# Patient Record
Sex: Male | Born: 1955 | Race: White | Hispanic: No | Marital: Married | Smoking: Never smoker
Health system: Southern US, Community
[De-identification: ages and names within clinical notes are randomized; demographics above are authoritative.]

## PROBLEM LIST (undated history)

## (undated) DIAGNOSIS — I739 Peripheral vascular disease, unspecified: Secondary | ICD-10-CM

## (undated) DIAGNOSIS — F0280 Dementia in other diseases classified elsewhere without behavioral disturbance: Secondary | ICD-10-CM

## (undated) DIAGNOSIS — R569 Unspecified convulsions: Secondary | ICD-10-CM

## (undated) DIAGNOSIS — E785 Hyperlipidemia, unspecified: Secondary | ICD-10-CM

## (undated) DIAGNOSIS — R451 Restlessness and agitation: Secondary | ICD-10-CM

## (undated) DIAGNOSIS — G300 Alzheimer's disease with early onset: Secondary | ICD-10-CM

---

## 2009-08-05 ENCOUNTER — Ambulatory Visit: Payer: Self-pay | Admitting: Diagnostic Radiology

## 2009-08-05 ENCOUNTER — Emergency Department (HOSPITAL_BASED_OUTPATIENT_CLINIC_OR_DEPARTMENT_OTHER): Admission: EM | Admit: 2009-08-05 | Discharge: 2009-08-05 | Payer: Self-pay | Admitting: Emergency Medicine

## 2017-03-20 ENCOUNTER — Emergency Department (HOSPITAL_COMMUNITY): Payer: Self-pay

## 2017-03-20 ENCOUNTER — Encounter (HOSPITAL_COMMUNITY): Payer: Self-pay | Admitting: Emergency Medicine

## 2017-03-20 ENCOUNTER — Emergency Department (HOSPITAL_COMMUNITY)
Admission: EM | Admit: 2017-03-20 | Discharge: 2017-03-22 | Disposition: A | Discharge status: Home / Self Care | Payer: Self-pay | Attending: Emergency Medicine | Admitting: Emergency Medicine

## 2017-03-20 DIAGNOSIS — R4689 Other symptoms and signs involving appearance and behavior: Secondary | ICD-10-CM

## 2017-03-20 DIAGNOSIS — Z79899 Other long term (current) drug therapy: Secondary | ICD-10-CM | POA: Insufficient documentation

## 2017-03-20 DIAGNOSIS — F0391 Unspecified dementia with behavioral disturbance: Secondary | ICD-10-CM

## 2017-03-20 DIAGNOSIS — Z008 Encounter for other general examination: Secondary | ICD-10-CM

## 2017-03-20 DIAGNOSIS — F0390 Unspecified dementia without behavioral disturbance: Secondary | ICD-10-CM | POA: Diagnosis present

## 2017-03-20 HISTORY — DX: Hyperlipidemia, unspecified: E78.5

## 2017-03-20 LAB — COMPREHENSIVE METABOLIC PANEL
ALBUMIN: 3.3 g/dL — AB (ref 3.5–5.0)
ALT: 41 U/L (ref 17–63)
AST: 69 U/L — AB (ref 15–41)
Alkaline Phosphatase: 59 U/L (ref 38–126)
Anion gap: 8 (ref 5–15)
BILIRUBIN TOTAL: 0.7 mg/dL (ref 0.3–1.2)
BUN: 14 mg/dL (ref 6–20)
CHLORIDE: 102 mmol/L (ref 101–111)
CO2: 27 mmol/L (ref 22–32)
CREATININE: 0.95 mg/dL (ref 0.61–1.24)
Calcium: 8.3 mg/dL — ABNORMAL LOW (ref 8.9–10.3)
GFR calc Af Amer: >60 mL/min (ref >60)
GLUCOSE: 99 mg/dL (ref 65–99)
Potassium: 3.3 mmol/L — ABNORMAL LOW (ref 3.5–5.1)
Sodium: 137 mmol/L (ref 135–145)
Total Protein: 6.4 g/dL — ABNORMAL LOW (ref 6.5–8.1)

## 2017-03-20 LAB — CBC WITH DIFFERENTIAL/PLATELET
BASOS ABS: 0 10*3/uL (ref 0.0–0.1)
Basophils Relative: 0 %
EOS PCT: 4 %
Eosinophils Absolute: 0.3 10*3/uL (ref 0.0–0.7)
HEMATOCRIT: 37.1 % — AB (ref 39.0–52.0)
Hemoglobin: 13.1 g/dL (ref 13.0–17.0)
LYMPHS PCT: 30 %
Lymphs Abs: 2.2 10*3/uL (ref 0.7–4.0)
MCH: 31.3 pg (ref 26.0–34.0)
MCHC: 35.3 g/dL (ref 30.0–36.0)
MCV: 88.8 fL (ref 78.0–100.0)
MONO ABS: 0.8 10*3/uL (ref 0.1–1.0)
MONOS PCT: 11 %
NEUTROS ABS: 4 10*3/uL (ref 1.7–7.7)
Neutrophils Relative %: 55 %
PLATELETS: 236 10*3/uL (ref 150–400)
RBC: 4.18 MIL/uL — ABNORMAL LOW (ref 4.22–5.81)
RDW: 12.1 % (ref 11.5–15.5)
WBC: 7.2 10*3/uL (ref 4.0–10.5)

## 2017-03-20 LAB — CBG MONITORING, ED: Glucose-Capillary: 85 mg/dL (ref 65–99)

## 2017-03-20 LAB — ETHANOL

## 2017-03-20 MED ORDER — ALPRAZOLAM 0.5 MG PO TABS
0.5000 mg | ORAL_TABLET | Freq: Once | ORAL | Status: AC
  Administered 2017-03-20: 0.5 mg via ORAL
  Filled 2017-03-20: qty 1

## 2017-03-20 MED ORDER — QUETIAPINE FUMARATE 100 MG PO TABS
100.0000 mg | ORAL_TABLET | Freq: Two times a day (BID) | ORAL | Status: DC
  Administered 2017-03-20 – 2017-03-22 (×4): 100 mg via ORAL
  Filled 2017-03-20 (×4): qty 1

## 2017-03-20 MED ORDER — PRAVASTATIN SODIUM 40 MG PO TABS
40.0000 mg | ORAL_TABLET | Freq: Every day | ORAL | Status: DC
  Administered 2017-03-21 – 2017-03-22 (×2): 40 mg via ORAL
  Filled 2017-03-20 (×2): qty 1
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
## 2017-03-20 NOTE — Care Management (Signed)
[facility] to [facility], [facility], [place], [address] [place], [place], [place], [facility] [address] [place], [place], [place], [facility].

## 2017-03-20 NOTE — BHH Counselor (Signed)
Writer received paperwork from ALF. They faxed over General Durable Power of Attorney paperwork and power of attorney is pt's wife Judeth CornfieldStephanie. When Clinical research associatewriter spoke w/ wife, wife thought she was also HPOA for pt but wife wasn't sure. Per RN Deanna, this is only power of attorney paperwork they have for pt.   Evette Cristalaroline Paige Mandy [place], [place] Therapeutic Triage Specialist

## 2017-03-20 NOTE — ED Provider Notes (Signed)
History of dementia, disoriented here No collateral information available -(family unavailable) Here for aggressive behavior New to NH 4 days ago Recent cessation of Xanax by wife (??)  Pending TTS consultation  [facility] RN reports patient is increasingly agitated and restless. Will provide Xanax as he was on this in the past.   TTS performed consultation including collateral information from wife and [facility]. [facility].   Kenneth Mcdaniel, Kenneth Glandon, PA-C 03/20/17 2037    Jacalyn LefevreHaviland, Julie, MD 03/21/17 1255
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
## 2017-03-20 NOTE — ED Notes (Signed)
Bed: WA29 Expected date:  Expected time:  Means of arrival:  Comments: 

## 2017-03-20 NOTE — ED Provider Notes (Signed)
[facility]-EMERGENCY DEPT Provider Note   CSN: [id number] Arrival date & time: 03/20/17  1420     History   Chief Complaint Chief Complaint  Patient presents with  . Psychiatric Evaluation   Level 5 caveat due to dementia HPI Kenneth Mcdaniel is a 61 y.o. male with history of early onset dementia, HLD presents today brought in by nursing home for psychiatric evaluation.  Patient recently [facility] on [facility].  Spoke with Carlye Grippeiesha at the facility who states patient has been aggressive with staff, attempting to strike at them when  they attempt to help him with his daily activities.  She states he has also broken objects in the facility and been generally destructive.  She states his wife dropped him off on Friday and has not been back to see him since then.  She also states that his wife has stopped his Xanax for unknown reasons.  Attempted to call patient's wife, both phone numbers on file are out of service. Patient does not answer questions appropriately and laughs inappropriately; [facility], this is patient's baseline.   The history is provided by the patient.    History reviewed. No pertinent past medical history.  There are no active problems to display for this patient.   The histories are not reviewed yet. Please review them in the "History" navigator section and refresh this SmartLink.     Home Medications    Prior to Admission medications   Medication Sig Start Date End Date Taking? Authorizing Provider  ALPRAZolam Prudy Feeler(XANAX) 1 MG tablet Take 0.5 mg by mouth 3 (three) times daily as needed for anxiety.   Yes [provider]  pravastatin (PRAVACHOL) 40 MG tablet Take 40 mg by mouth daily.   Yes [provider]  QUEtiapine (SEROQUEL) 100 MG tablet Take 100 mg by mouth 2 (two) times daily.   Yes [provider]  rivastigmine (EXELON) 13.3 MG/24HR Place 13.3 mg onto the skin daily.   Yes  [provider]    Family History No family history on file.  Social History Social History   Tobacco Use  . Smoking status: Not on file  Substance Use Topics  . Alcohol use: Not on file  . Drug use: Not on file     Allergies   Patient has no allergy information on record.   Review of Systems Review of Systems  Unable to perform ROS: Dementia     Physical Exam Updated Vital Signs BP 140/86 (BP Location: Right Arm)   Pulse 69   Temp 98.8 F (37.1 C) (Oral)   Resp 16   Physical Exam  Constitutional: He appears well-developed and well-nourished. No distress.  HENT:  Head: Normocephalic and atraumatic.  Eyes: Conjunctivae and EOM are normal. Pupils are equal, round, and reactive to light. Right eye exhibits no discharge. Left eye exhibits no discharge.  Neck: Normal range of motion. Neck supple. No JVD present. No tracheal deviation present.  Cardiovascular: Normal rate, regular rhythm, normal heart sounds and intact distal pulses.  2+ radial and DP/PT pulses bl, no LE edema  Pulmonary/Chest: Effort normal and breath sounds normal. He exhibits no tenderness.  Abdominal: Soft. Bowel sounds are normal. He exhibits distension. There is no tenderness.  Musculoskeletal: Normal range of motion. He exhibits no edema.  No midline spine TTP, no paraspinal muscle tenderness, no deformity, crepitus, or step-off noted. 5/5 strength of BUE and BLE major muscle groups.   Neurological: He  is alert. No cranial nerve deficit or sensory deficit. He exhibits normal muscle tone.  Alert to person only.  No facial droop.  No dysarthria.  Does not answer questions appropriately but generally follows commands appropriately.    Cranial Nerves:  II:  pupils equal, round, reactive to light III,IV, VI: ptosis not present, extra-ocular motions intact bilaterally  V,VII: smile symmetric VIII: hearing grossly normal to voice  X: uvula elevates symmetrically  XI: bilateral shoulder shrug  symmetric and strong XII: midline tongue extension without fassiculations Motor:  Normal tone. 5/5 strength of BUE and BLE major muscle groups including strong and equal grip strength and dorsiflexion/plantar flexion Sensory: light touch normal in all extremities. Cerebellar: normal finger-to-nose with bilateral upper extremities CV: 2+ radial and DP/PT pulses   Skin: Skin is warm and dry. No erythema.  Psychiatric: His affect is labile and inappropriate. Cognition and memory are impaired.  Does not appear to be responding to internal stimuli.  Exhibits word salad when asked questions.  Does not answer questions appropriately. He is inattentive.  Nursing note and vitals reviewed.    ED Treatments / Results  Labs (all labs ordered are listed, but only abnormal results are displayed) Labs Reviewed  COMPREHENSIVE METABOLIC PANEL  ETHANOL  RAPID URINE DRUG SCREEN, HOSP PERFORMED  CBC WITH DIFFERENTIAL/PLATELET  URINALYSIS, COMPLETE (UACMP) WITH MICROSCOPIC  AMMONIA  CBG MONITORING, ED    EKG  EKG Interpretation None       Radiology No results found.  Procedures Procedures (including critical care time)  Medications Ordered in ED Medications - No data to display   Initial Impression / Assessment and Plan / ED Course  I have reviewed the triage vital signs and the nursing notes.  Pertinent labs & imaging results that were available during my care of the patient were reviewed by me and considered in my medical decision making (see chart for details).    Patient presents from nursing home for evaluation of aggressive behavior.  Afebrile, vital signs are stable.  He exhibits labile affect and does not answer questions appropriately.  Per staff at [facility], this is [facility]'s [facility]  He has been with them since Friday (4 days).  Attempted to reach out to patient's wife but phone numbers on file are out of service. Do not have baseline imaging or  notes from PCP. Will obtain CT of head, EKG, UA, and labs for evaluation of altered mental status. Patient seen and evaluated by Dr. Particia NearingHaviland who agrees with assessment and plan at this time.   4:25 PM Signed out to oncoming provider PA Upstill. Awaiting workup and TTS evaluation. If workup is unconcerning for acutely altered mental status, patient medically cleared for TTS evaluation. Otherwise, patient may require admission and further consultation for further evaluation.   [facility] [phone]  Final Clinical Impressions(s) / ED Diagnoses   Final diagnoses:  Aggressive behavior    ED Discharge Orders    None       Bennye AlmFawze, Neveah Bang A, PA-C 03/20/17 1625    Jacalyn LefevreHaviland, Julie, MD 03/21/17 1254
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
## 2017-03-21 ENCOUNTER — Emergency Department (HOSPITAL_COMMUNITY): Payer: Self-pay

## 2017-03-21 ENCOUNTER — Encounter (HOSPITAL_COMMUNITY): Payer: Self-pay | Admitting: Psychiatry

## 2017-03-21 DIAGNOSIS — R413 Other amnesia: Secondary | ICD-10-CM

## 2017-03-21 DIAGNOSIS — R4587 Impulsiveness: Secondary | ICD-10-CM

## 2017-03-21 DIAGNOSIS — F0390 Unspecified dementia without behavioral disturbance: Secondary | ICD-10-CM | POA: Diagnosis present

## 2017-03-21 DIAGNOSIS — F0391 Unspecified dementia with behavioral disturbance: Secondary | ICD-10-CM

## 2017-03-21 DIAGNOSIS — F39 Unspecified mood [affective] disorder: Secondary | ICD-10-CM

## 2017-03-21 LAB — URINALYSIS, COMPLETE (UACMP) WITH MICROSCOPIC
Bacteria, UA: NONE SEEN
Bilirubin Urine: NEGATIVE
Glucose, UA: NEGATIVE mg/dL
Ketones, ur: NEGATIVE mg/dL
Leukocytes, UA: NEGATIVE
Nitrite: NEGATIVE
Protein, ur: NEGATIVE mg/dL
Specific Gravity, Urine: 1.006 (ref 1.005–1.030)
Squamous Epithelial / LPF: NONE SEEN
pH: 6 (ref 5.0–8.0)

## 2017-03-21 LAB — RAPID URINE DRUG SCREEN, HOSP PERFORMED
Amphetamines: NOT DETECTED
Barbiturates: NOT DETECTED
Benzodiazepines: POSITIVE — AB
COCAINE: NOT DETECTED
OPIATES: NOT DETECTED
Tetrahydrocannabinol: NOT DETECTED

## 2017-03-21 MED ORDER — ALPRAZOLAM 0.5 MG PO TABS
0.5000 mg | ORAL_TABLET | Freq: Two times a day (BID) | ORAL | Status: DC | PRN
Start: 2017-03-21 — End: 2017-03-22
  Administered 2017-03-21: 0.5 mg via ORAL
  Filled 2017-03-21: qty 1

## 2017-03-21 NOTE — ED Notes (Signed)
Patient becoming more agitated.  Trying to get out of med. Sitter at bedside.  Will continue to monitor.

## 2017-03-21 NOTE — Consult Note (Addendum)
[place] Psychiatry Consult   Reason for Consult:  Aggressive behavior Referring Physician:  EDP Patient Identification: Kenneth Mcdaniel MRN:  [id number] Principal Diagnosis: Dementia Diagnosis:   Patient Active Problem List   Diagnosis Date Noted  . Dementia [F03.90] 03/21/2017    Total Time spent with patient: 45 minutes  Subjective:   Kenneth Mcdaniel is a 61 y.o. male patient admitted from his ALF with aggressive behavior.  HPI:  Pt was seen and chart reviewed with treatment team and Dr Mariea Clonts. Pt was unable to state the following: his full name, his wife's name, where he is, and what year it is. Pt answered "I don't know" to all questions. Pt would attempt to answer but he stammered and struggled with word finding. Pt was calm and cooperative and able to sit on the side of the bed to eat his breakfast. Pt is able to ambulate and feed himself. Pt seemed extremely happy to know his breakfast was there and started laughing. Pt has severe memory impairment and would benefit from an inpatient gero-psychiatric inpatient admission.    Past Psychiatric History: As above  Risk to Self: Suicidal Ideation: (unable to assess) Suicidal Intent: (unable to assess) Is patient at risk for suicide?: No Suicidal Plan?: (unable to assess) Access to Means: No What has been your use of drugs/alcohol within the last 12 months?: none Other Self Harm Risks: none Triggers for Past Attempts: (n/a) Intentional Self Injurious Behavior: None Risk to Others: Homicidal Ideation: (unable to assess) Thoughts of Harm to Others: (unable to assess) Current Homicidal Intent: (unable to assess) Current Homicidal Plan: (unable to assess) Access to Homicidal Means: No Identified Victim: unable to assess History of harm to others?: No Assessment of Violence: None Noted Violent Behavior Description: (wife reports pt has no hx violence) Does patient have access to weapons?: No Criminal Charges Pending?:  No Does patient have a court date: No Prior Inpatient Therapy: Prior Inpatient Therapy: No Prior Outpatient Therapy: Prior Outpatient Therapy: No Does patient have an ACCT team?: No Does patient have Intensive In-House Services?  : No Does patient have [facility] services? : No Does patient have P4[facility] services?: No  Past Medical History: History reviewed. No pertinent past medical history.  Family History: No family history on file. Family Psychiatric  History: Unknown Social History:  Social History   Substance and Sexual Activity  Alcohol Use Not on file     Social History   Substance and Sexual Activity  Drug Use Not on file    Social History   Socioeconomic History  . Marital status: Married    Spouse name: None  . Number of children: None  . Years of education: None  . Highest education level: None  Social Needs  . Financial resource strain: None  . Food insecurity - worry: None  . Food insecurity - inability: None  . Transportation needs - medical: None  . Transportation needs - non-medical: None  Occupational History  . None  Tobacco Use  . Smoking status: None  Substance and Sexual Activity  . Alcohol use: None  . Drug use: None  . Sexual activity: None  Other Topics Concern  . None  Social History Narrative  . None   Additional Social History: N/A    Allergies:  No Known Allergies  Labs:  Results for orders placed or performed during the hospital encounter of 03/20/17 (from the past 48 hour(s))  Comprehensive metabolic panel     Status: Abnormal  Collection Time: 03/20/17  4:00 PM  Result Value Ref Range   Sodium 137 135 - 145 mmol/L   Potassium 3.3 (L) 3.5 - 5.1 mmol/L   Chloride 102 101 - 111 mmol/L   CO2 27 22 - 32 mmol/L   Glucose, Bld 99 65 - 99 mg/dL   BUN 14 6 - 20 mg/dL   Creatinine, Ser 0.95 0.61 - 1.24 mg/dL   Calcium 8.3 (L) 8.9 - 10.3 mg/dL   Total Protein 6.4 (L) 6.5 - 8.1 g/dL   Albumin 3.3 (L) 3.5 - 5.0 g/dL   AST 69 (H) 15 -  41 U/L   ALT 41 17 - 63 U/L   Alkaline Phosphatase 59 38 - 126 U/L   Total Bilirubin 0.7 0.3 - 1.2 mg/dL   GFR calc non Af Amer >60 >60 mL/min   GFR calc Af Amer >60 >60 mL/min    Comment: (NOTE) The eGFR has been calculated using the CKD EPI equation. This calculation has not been validated in all clinical situations. eGFR's persistently <60 mL/min signify possible Chronic Kidney Disease.    Anion gap 8 5 - 15  Ethanol     Status: None   Collection Time: 03/20/17  4:00 PM  Result Value Ref Range   Alcohol, Ethyl (B) <10 <10 mg/dL    Comment:        LOWEST DETECTABLE LIMIT FOR SERUM ALCOHOL IS 10 mg/dL FOR MEDICAL PURPOSES ONLY   CBC with Diff     Status: Abnormal   Collection Time: 03/20/17  4:00 PM  Result Value Ref Range   WBC 7.2 4.0 - 10.5 K/uL   RBC 4.18 (L) 4.22 - 5.81 MIL/uL   Hemoglobin 13.1 13.0 - 17.0 g/dL   HCT 37.1 (L) 39.0 - 52.0 %   MCV 88.8 78.0 - 100.0 fL   MCH 31.3 26.0 - 34.0 pg   MCHC 35.3 30.0 - 36.0 g/dL   RDW 12.1 11.5 - 15.5 %   Platelets 236 150 - 400 K/uL   Neutrophils Relative % 55 %   Neutro Abs 4.0 1.7 - 7.7 K/uL   Lymphocytes Relative 30 %   Lymphs Abs 2.2 0.7 - 4.0 K/uL   Monocytes Relative 11 %   Monocytes Absolute 0.8 0.1 - 1.0 K/uL   Eosinophils Relative 4 %   Eosinophils Absolute 0.3 0.0 - 0.7 K/uL   Basophils Relative 0 %   Basophils Absolute 0.0 0.0 - 0.1 K/uL  CBG monitoring, ED     Status: None   Collection Time: 03/20/17  4:53 PM  Result Value Ref Range   Glucose-Capillary 85 65 - 99 mg/dL  Urine rapid drug screen (hosp performed)     Status: Abnormal   Collection Time: 03/21/17  5:06 AM  Result Value Ref Range   Opiates NONE DETECTED NONE DETECTED   Cocaine NONE DETECTED NONE DETECTED   Benzodiazepines POSITIVE (A) NONE DETECTED   Amphetamines NONE DETECTED NONE DETECTED   Tetrahydrocannabinol NONE DETECTED NONE DETECTED   Barbiturates NONE DETECTED NONE DETECTED    Comment:        DRUG SCREEN FOR MEDICAL  PURPOSES ONLY.  IF CONFIRMATION IS NEEDED FOR ANY PURPOSE, NOTIFY LAB WITHIN 5 DAYS.        LOWEST DETECTABLE LIMITS FOR URINE DRUG SCREEN Drug Class       Cutoff (ng/mL) Amphetamine      1000 Barbiturate      200 Benzodiazepine   200 Tricyclics  300 Opiates          300 Cocaine          300 THC              50   Urinalysis, Complete w Microscopic     Status: Abnormal   Collection Time: 03/21/17  5:06 AM  Result Value Ref Range   Color, Urine YELLOW YELLOW   APPearance CLEAR CLEAR   Specific Gravity, Urine 1.006 1.005 - 1.030   pH 6.0 5.0 - 8.0   Glucose, UA NEGATIVE NEGATIVE mg/dL   Hgb urine dipstick SMALL (A) NEGATIVE   Bilirubin Urine NEGATIVE NEGATIVE   Ketones, ur NEGATIVE NEGATIVE mg/dL   Protein, ur NEGATIVE NEGATIVE mg/dL   Nitrite NEGATIVE NEGATIVE   Leukocytes, UA NEGATIVE NEGATIVE   RBC / HPF 0-5 0 - 5 RBC/hpf   WBC, UA 0-5 0 - 5 WBC/hpf   Bacteria, UA NONE SEEN NONE SEEN   Squamous Epithelial / LPF NONE SEEN NONE SEEN   Mucus PRESENT     Current Facility-Administered Medications  Medication Dose Route Frequency Provider Last Rate Last Dose  . ALPRAZolam [name]) tablet 0.5 mg  0.5 mg Oral BID PRN Ethelene Hal, NP      . pravastatin (PRAVACHOL) tablet 40 mg  40 mg Oral Daily Upstill, Nehemiah Settle, PA-C   40 mg at 03/21/17 1039  . QUEtiapine (SEROQUEL) tablet 100 mg  100 mg Oral BID Charlann Lange, PA-C   100 mg at 03/21/17 1040   Current Outpatient Medications  Medication Sig Dispense Refill  . pravastatin (PRAVACHOL) 40 MG tablet Take 40 mg by mouth daily.    . QUEtiapine (SEROQUEL) 100 MG tablet Take 100 mg by mouth 2 (two) times daily.      Musculoskeletal: Strength & Muscle Tone: within normal limits Gait & Station: normal Patient leans: N/A  Psychiatric Specialty Exam: Physical Exam  Constitutional: He appears well-developed and well-nourished.  Respiratory: Effort normal.  Musculoskeletal: Normal range of motion.  Neurological: He is  alert.  Psychiatric: He has a normal mood and affect. Thought content normal. His speech is delayed. He expresses impulsivity. He exhibits abnormal recent memory and abnormal remote memory. He is inattentive.    Review of Systems  Psychiatric/Behavioral: Positive for memory loss.  All other systems reviewed and are negative.   Blood pressure 139/81, pulse 74, temperature 97.6 F (36.4 C), temperature source Oral, resp. rate 16, SpO2 100 %.There is no height or weight on file to calculate BMI.  General Appearance: Disheveled  Eye Contact:  Fair  Speech:  Blocked and Dementia  Volume:  Decreased  Mood:  UTA Dementia  Affect:  UTA Dementia  Thought Process:  Disorganized  Orientation:  Other:  self only  Thought Content:  UTA Dementia  Suicidal Thoughts:  UTA Dementia  Homicidal Thoughts:  UTA Dementia  Memory:  Immediate;   Poor Recent;   Poor Remote;   Poor  Judgement:  Other:  UTA Dementia  Insight:  UTA Dementia  Psychomotor Activity:  Restlessness  Concentration:  Concentration: UTA Dementia and Attention Span: UTA Dementia  Recall:  UTA Dementia  Fund of Knowledge:  UTA Dementia  Language:  Fair  Akathisia:  No  Handed:  Right  AIMS (if indicated):   N/A  Assets:  Financial Resources/Insurance  ADL's:  Impaired  Cognition:  Impaired,  Severe  Sleep:   N/A     Treatment Plan Summary: Daily contact with patient to assess and evaluate symptoms  and progress in treatment and Medication management  -Crisis stabilization Continue these medications: -Seroquel 100 mg BID for mood stabilization -Xanax 0.5 mg BID PRN for anxiety. Appears to help with restlessness/agitation.   Disposition: Recommend psychiatric Inpatient admission when medically cleared. TTS to seek Gero-Psych bed  Ethelene Hal, NP 03/21/2017 11:32 AM   Patient seen face-to-face for psychiatric evaluation, chart reviewed and case discussed with the physician extender and developed treatment plan.  Reviewed the information documented and agree with the treatment plan.  Buford Dresser, DO

## 2017-03-21 NOTE — ED Notes (Signed)
Patient ate about 50% of lunch with assistance.

## 2017-03-21 NOTE — ED Notes (Signed)
Patient is calm and cooperative generally.  He did get up from hospital bed once and had been incontinent.  Nurse and techs cleaned him up and now he is sleeping peacefully.

## 2017-03-21 NOTE — BH Assessment (Addendum)
[facility]  Per Juanetta BeetsJacqueline Norman, DO, this pt requires psychiatric hospitalization at this time.  Pt presents under voluntary status, but his wife, Delmar LandauStephanie Parker [place], is his health care power of attorney, and documents are on pt's chart.  The following facilities have been contacted to seek placement for this pt, with results as noted:  Beds available, information sent, decision pending:  [facility]   [facility]  [facility]   [place], [place] Triage Specialist [phone]   Addendum:  At 14:11 this writer called Claudette LawsStephanie Agnes and informed her of pt's current disposition.  I will continue to seek placement.  Doylene Canninghomas Jabir Dahlem, [place] Behavioral Health Coordinator [phone]

## 2017-03-22 MED ORDER — ALPRAZOLAM 0.5 MG PO TABS: 0.5000 mg | ORAL_TABLET | Freq: Two times a day (BID) | ORAL | 0 refills | Status: DC | PRN

## 2017-03-22 NOTE — Progress Notes (Signed)
Patient ID: Kenneth SellMichael R Mcdaniel, male   DOB: 04/04/1956, 61 y.o.   MRN: [id number]   Pt was seen by treatment team this AM. Pt has advanced dementia and lives in a memory care ALF. Pt is st his baseline and will return to his memory care facility. Pt is psychiatrically clear for discharge.   Laveda AbbeLaurie Britton Parks 03/22/2017    1030

## 2017-03-22 NOTE — BH Assessment (Signed)
[facility]  Per Juanetta BeetsJacqueline Norman, DO, this pt does not require psychiatric hospitalization at this time.  [facility]  No discharge instructions are required.  Pt's nurse, Aram BeechamCynthia, has been notified.  Doylene Canninghomas Muaaz Brau, MA Triage Specialist [phone]

## 2017-03-22 NOTE — BHH Suicide Risk Assessment (Signed)
Suicide Risk Assessment  Discharge Assessment   [name]   Principal Problem: Dementia Discharge Diagnoses:  Patient Active Problem List   Diagnosis Date Noted  . Dementia [F03.90] 03/21/2017   Pt was seen by treatment team. Pt has advanced dementia and resides in  A memory care facility. Pt's family wishes to take him back to the facility and has declined the search for an inpatient geriatric  psychiatric admission. Pt is psychiatrically clear for discharge.   Total Time spent with patient: 30 minutes  Musculoskeletal: Strength & Muscle Tone: within normal limits Gait & Station: normal Patient leans: N/A  Psychiatric Specialty Exam:   Blood pressure 136/73, pulse 68, temperature 97.6 F (36.4 C), temperature source Oral, resp. rate 16, SpO2 99 %.There is no height or weight on file to calculate BMI.  General Appearance: Casual  Eye Contact::  Fair  Speech:  [phone]  Volume:  Decreased  Mood:  UTA, advanced dementia  Affect:  UTA, advanced dementia  Thought Process:  Disorganized  Orientation:  Other:  self  Thought Content:  UTA, advanced dementia  Suicidal Thoughts:  UTA, advanced dementia  Homicidal Thoughts:  UTA, advanced dementia  Memory:  Immediate;   Poor Recent;   Poor Remote;   Poor  Judgement:  Other:  UTA, advanced dementia  Insight:  UTA, advanced dementia  Psychomotor Activity:  Normal  Concentration:  UTA, advanced dementia  Recall:  UTA, advanced dementia, poor  Fund of Knowledge:Poor  Language: Fair  Akathisia:  No  Handed:  Right  AIMS (if indicated):     Assets:  Financial Resources/Insurance Housing Social Support  Sleep:     Cognition: Impaired,  Severe  ADL's:  Impaired   Mental Status Per Nursing Assessment::   On Admission:   advanced dementia  Demographic Factors:  Male and Caucasian  Loss Factors: Decline in physical health  Historical Factors: UTA, advanced dementia  Risk Reduction Factors:    Living with another person, especially a relative and Positive social support  Continued Clinical Symptoms:  Depression:   Impulsivity  Cognitive Features That Contribute To Risk:  Loss of executive function    Suicide Risk:  Minimal: No identifiable suicidal ideation.  Patients presenting with no risk factors but with morbid ruminations; may be classified as minimal risk based on the severity of the depressive symptoms    Plan Of Care/Follow-up recommendations:  Activity:  as tolerated Diet:  Heart Healthy  Kenneth AbbeLaurie Britton Chanelle Hodsdon, NP 03/22/2017, 11:38 AM

## 2017-03-22 NOTE — BHH Counselor (Addendum)
Reassessment- Pt did not appear to be oriented. Pt was laughing on and off while the writer was present. The Pt stated yes repeatedly. The Pt could not answer the questions of the reassessment.  Pt's family has decided for the Pt to return back to his ALF.  Kenneth PhoenixBrandi Jayana Mcdaniel, [facility] Triage Specialist

## 2017-03-22 NOTE — ED Provider Notes (Signed)
Discharge per psychiatric team.     Azalia Bilisampos, [place] Lingenfelter, MD 03/22/17 1154

## 2017-03-22 NOTE — Progress Notes (Signed)
CSW completed FL2 [facility]   Stacy GardnerErin Avari Nevares, [facility] Emergency Room Clinical Social Worker [phone]

## 2017-03-22 NOTE — NC FL2 (Signed)
  [place] MEDICAID FL2 LEVEL OF CARE SCREENING TOOL     IDENTIFICATION  Patient Name: Kenneth Mcdaniel Birthdate: 12-28-1955 Sex: male Admission Date (Current Location): 03/20/2017  [facility] [place]   Facility and Address:  [facility],  [address], [place] [id number]      Provider Number: [id number]  Attending Physician Name and Address:  Default, Provider, MD  Relative Name and Phone Number:       Current Level of Care: Hospital Recommended Level of Care: Assisted Living Facility, Memory Care Prior Approval Number:    Date Approved/Denied:   PASRR Number:    Discharge Plan: Other (Comment)(assited living facility )    Current Diagnoses: Patient Active Problem List   Diagnosis Date Noted  . Dementia 03/21/2017    Orientation RESPIRATION BLADDER Height & Weight        Normal Continent Weight:   Height:     BEHAVIORAL SYMPTOMS/MOOD NEUROLOGICAL BOWEL NUTRITION STATUS  Investment banker, corporateWanderer   Continent Diet(regular )  AMBULATORY STATUS COMMUNICATION OF NEEDS Skin   Independent Verbally Normal                       Personal Care Assistance Level of Assistance  Bathing, Feeding, Dressing Bathing Assistance: Limited assistance Feeding assistance: Limited assistance Dressing Assistance: Limited assistance     Functional Limitations Info             SPECIAL CARE FACTORS FREQUENCY                       Contractures      Additional Factors Info  Code Status, Allergies Code Status Info: Full code  Allergies Info: NKA            Current Medications (03/22/2017):  This is the current hospital active medication list Current Facility-Administered Medications  Medication Dose Route Frequency Provider Last Rate Last Dose  . ALPRAZolam Prudy Feeler(XANAX) tablet 0.5 mg  0.5 mg Oral BID PRN Laveda AbbeParks, Laurie Britton, NP   0.5 mg at 03/21/17 1312  . pravastatin (PRAVACHOL) tablet 40 mg  40 mg Oral Daily Upstill, Melvenia BeamShari, PA-C   40 mg at  03/22/17 1103  . QUEtiapine (SEROQUEL) tablet 100 mg  100 mg Oral BID Elpidio AnisUpstill, Shari, PA-C   100 mg at 03/22/17 1103   Current Outpatient Medications  Medication Sig Dispense Refill  . pravastatin (PRAVACHOL) 40 MG tablet Take 40 mg by mouth daily.    . QUEtiapine (SEROQUEL) 100 MG tablet Take 100 mg by mouth 2 (two) times daily.       Discharge Medications: Please see discharge summary for a list of discharge medications.  Relevant Imaging Results:  Relevant Lab Results:   Additional Information SS#: [id number]  Donnie CoffinErin M Keeva Reisen, LCSW

## 2017-04-26 ENCOUNTER — Other Ambulatory Visit: Payer: Self-pay

## 2017-04-26 ENCOUNTER — Encounter (HOSPITAL_COMMUNITY): Payer: Self-pay

## 2017-04-26 ENCOUNTER — Emergency Department (HOSPITAL_COMMUNITY)
Admission: EM | Admit: 2017-04-26 | Discharge: 2017-04-26 | Disposition: A | Discharge status: Home / Self Care | Payer: BLUE CROSS/BLUE SHIELD | Attending: Emergency Medicine | Admitting: Emergency Medicine

## 2017-04-26 DIAGNOSIS — F0280 Dementia in other diseases classified elsewhere without behavioral disturbance: Secondary | ICD-10-CM | POA: Diagnosis not present

## 2017-04-26 DIAGNOSIS — Y92129 Unspecified place in nursing home as the place of occurrence of the external cause: Secondary | ICD-10-CM | POA: Diagnosis not present

## 2017-04-26 DIAGNOSIS — G300 Alzheimer's disease with early onset: Secondary | ICD-10-CM | POA: Diagnosis not present

## 2017-04-26 DIAGNOSIS — S0502XA Injury of conjunctiva and corneal abrasion without foreign body, left eye, initial encounter: Secondary | ICD-10-CM

## 2017-04-26 DIAGNOSIS — Y999 Unspecified external cause status: Secondary | ICD-10-CM | POA: Insufficient documentation

## 2017-04-26 DIAGNOSIS — S0592XA Unspecified injury of left eye and orbit, initial encounter: Secondary | ICD-10-CM | POA: Diagnosis present

## 2017-04-26 DIAGNOSIS — W504XXA Accidental scratch by another person, initial encounter: Secondary | ICD-10-CM | POA: Diagnosis not present

## 2017-04-26 DIAGNOSIS — Z23 Encounter for immunization: Secondary | ICD-10-CM | POA: Diagnosis not present

## 2017-04-26 DIAGNOSIS — Y939 Activity, unspecified: Secondary | ICD-10-CM | POA: Diagnosis not present

## 2017-04-26 HISTORY — DX: Dementia in other diseases classified elsewhere, unspecified severity, without behavioral disturbance, psychotic disturbance, mood disturbance, and anxiety: F02.80

## 2017-04-26 HISTORY — DX: Dementia in other diseases classified elsewhere without behavioral disturbance: G30.0

## 2017-04-26 MED ORDER — FLUORESCEIN SODIUM 1 MG OP STRP
1.0000 | ORAL_STRIP | Freq: Once | OPHTHALMIC | Status: AC
  Administered 2017-04-26: 1 via OPHTHALMIC
  Filled 2017-04-26: qty 1

## 2017-04-26 MED ORDER — KETOROLAC TROMETHAMINE 0.5 % OP SOLN: 1.0000 [drp] | Freq: Four times a day (QID) | OPHTHALMIC | 0 refills | Status: DC

## 2017-04-26 MED ORDER — TETANUS-DIPHTH-ACELL PERTUSSIS 5-2.5-18.5 LF-MCG/0.5 IM SUSP
0.5000 mL | Freq: Once | INTRAMUSCULAR | Status: AC
  Administered 2017-04-26: 0.5 mL via INTRAMUSCULAR
  Filled 2017-04-26: qty 0.5

## 2017-04-26 MED ORDER — POLYMYXIN B-TRIMETHOPRIM 10000-0.1 UNIT/ML-% OP SOLN: 1.0000 [drp] | Freq: Four times a day (QID) | OPHTHALMIC | 0 refills | Status: AC

## 2017-04-26 MED ORDER — TETRACAINE HCL 0.5 % OP SOLN
1.0000 [drp] | Freq: Once | OPHTHALMIC | Status: AC
  Administered 2017-04-26: 1 [drp] via OPHTHALMIC
  Filled 2017-04-26: qty 4
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
## 2017-04-26 NOTE — ED Notes (Signed)
Visual acuity screen attempted. Patient demented and unable to complete screening.

## 2017-04-26 NOTE — Discharge Instructions (Signed)
Apply 1 drop of Polytrim to the left eye every 6 hours for the next 5 days.  Most corneal abrasions heal and 24-72 hours.  Please do not a apply a patch over the left eye.  650 mg of Tylenol can be given once every 6 hours as needed for pain control.  Alternatively, one drop of ketorolac can be placed to the eye every 6 hours as needed for pain control.  If the patient develops redness or swelling around the eye, fever, changes to the cornea or pupil, or complaints of changes to his vision, please return to the emergency department for re-evaluation.

## 2018-03-10 ENCOUNTER — Other Ambulatory Visit: Payer: Self-pay

## 2018-03-10 ENCOUNTER — Emergency Department (HOSPITAL_COMMUNITY): Payer: BLUE CROSS/BLUE SHIELD

## 2018-03-10 ENCOUNTER — Emergency Department (HOSPITAL_COMMUNITY)
Admission: EM | Admit: 2018-03-10 | Discharge: 2018-03-10 | Disposition: A | Discharge status: Home / Self Care | Payer: BLUE CROSS/BLUE SHIELD | Attending: Emergency Medicine | Admitting: Emergency Medicine

## 2018-03-10 ENCOUNTER — Encounter (HOSPITAL_COMMUNITY): Payer: Self-pay | Admitting: Emergency Medicine

## 2018-03-10 DIAGNOSIS — W010XXA Fall on same level from slipping, tripping and stumbling without subsequent striking against object, initial encounter: Secondary | ICD-10-CM | POA: Insufficient documentation

## 2018-03-10 DIAGNOSIS — F0390 Unspecified dementia without behavioral disturbance: Secondary | ICD-10-CM | POA: Diagnosis not present

## 2018-03-10 DIAGNOSIS — Y92128 Other place in nursing home as the place of occurrence of the external cause: Secondary | ICD-10-CM | POA: Insufficient documentation

## 2018-03-10 DIAGNOSIS — Y999 Unspecified external cause status: Secondary | ICD-10-CM | POA: Insufficient documentation

## 2018-03-10 DIAGNOSIS — S0083XA Contusion of other part of head, initial encounter: Secondary | ICD-10-CM | POA: Insufficient documentation

## 2018-03-10 DIAGNOSIS — Z79899 Other long term (current) drug therapy: Secondary | ICD-10-CM | POA: Insufficient documentation

## 2018-03-10 DIAGNOSIS — Y9301 Activity, walking, marching and hiking: Secondary | ICD-10-CM | POA: Insufficient documentation

## 2018-03-10 DIAGNOSIS — S0993XA Unspecified injury of face, initial encounter: Secondary | ICD-10-CM | POA: Diagnosis present

## 2018-03-10 LAB — BASIC METABOLIC PANEL
ANION GAP: 6 (ref 5–15)
BUN: 16 mg/dL (ref 8–23)
CO2: 25 mmol/L (ref 22–32)
Calcium: 9 mg/dL (ref 8.9–10.3)
Chloride: 104 mmol/L (ref 98–111)
Creatinine, Ser: 1.06 mg/dL (ref 0.61–1.24)
GFR calc Af Amer: >60 mL/min (ref >60)
GLUCOSE: 99 mg/dL (ref 70–99)
POTASSIUM: 4 mmol/L (ref 3.5–5.1)
Sodium: 135 mmol/L (ref 135–145)

## 2018-03-10 LAB — CBC
HCT: 39.2 % (ref 39.0–52.0)
Hemoglobin: 13.1 g/dL (ref 13.0–17.0)
MCH: 31.6 pg (ref 26.0–34.0)
MCHC: 33.4 g/dL (ref 30.0–36.0)
MCV: 94.5 fL (ref 80.0–100.0)
NRBC: 0 % (ref 0.0–0.2)
PLATELETS: 158 10*3/uL (ref 150–400)
RBC: 4.15 MIL/uL — ABNORMAL LOW (ref 4.22–5.81)
RDW: 12.1 % (ref 11.5–15.5)
WBC: 6.5 10*3/uL (ref 4.0–10.5)

## 2018-03-10 NOTE — ED Notes (Signed)
[facility] to [facility]

## 2018-03-10 NOTE — ED Provider Notes (Signed)
[facility] EMERGENCY DEPARTMENT Provider Note   CSN: [id number] Arrival date & time: 03/10/18  1926     History   Chief Complaint Chief Complaint  Patient presents with  . Fall    HPI Kenneth Mcdaniel is a 62 y.o. male.  HPI Patient presents to the ED for evaluation after a fall.  Patient was at [facility] assisted living.  He does have a history of dementia.  Patient was observed walking quickly when he stumbled and fell forward landing on his face.  No loss of consciousness.  Patient is not on blood thinners.  They did notice bruising and swelling to the face so they sent him to the emergency room for evaluation.  Patient is only aware of his name but is not aware of his situation.  He is unable to tell me what happened.  He denies any complaints of pain Past Medical History:  Diagnosis Date  . Early onset Alzheimer's dementia (HCC)    from [facility]  . Hyperlipidemia     Patient Active Problem List   Diagnosis Date Noted  . Dementia (HCC) 03/21/2017    History reviewed. No pertinent surgical history.      Home Medications    Prior to Admission medications   Medication Sig Start Date End Date Taking? Authorizing Provider  ALPRAZolam Prudy Feeler) 0.5 MG tablet Take 1 tablet (0.5 mg total) by mouth 2 (two) times daily as needed for anxiety. 03/22/17   Laveda Abbe, NP  ketorolac (ACULAR) 0.5 % ophthalmic solution Place 1 drop into the left eye every 6 (six) hours. 04/26/17   McDonald, Mia A, PA-C  pravastatin (PRAVACHOL) 40 MG tablet Take 40 mg by mouth daily.    [provider]  QUEtiapine (SEROQUEL) 100 MG tablet Take 100 mg by mouth 2 (two) times daily.    [provider]    Family History No family history on file.  Social History Social History   Tobacco Use  . Smoking status: Not on file  Substance Use Topics  . Alcohol use: Not on file  . Drug use: No     Allergies   Patient has no known  allergies.   Review of Systems Review of Systems  All other systems reviewed and are negative.    Physical Exam Updated Vital Signs BP 125/75   Pulse 68   Temp 98.3 F (36.8 C) (Oral)   Resp 18   SpO2 99%   Physical Exam  Constitutional: He appears well-developed and well-nourished. No distress.  HENT:  Head: Normocephalic.  Right Ear: External ear normal.  Left Ear: External ear normal.  Bruising contusion to the right side of the face  Eyes: Conjunctivae are normal. Right eye exhibits no discharge. Left eye exhibits no discharge. No scleral icterus.  Neck: Neck supple. No tracheal deviation present.  Cardiovascular: Normal rate, regular rhythm and intact distal pulses.  Pulmonary/Chest: Effort normal and breath sounds normal. No stridor. No respiratory distress. He has no wheezes. He has no rales.  Abdominal: Soft. Bowel sounds are normal. He exhibits no distension. There is no tenderness. There is no rebound and no guarding.  Musculoskeletal: He exhibits no edema or tenderness.       Right shoulder: He exhibits no tenderness, no bony tenderness and no swelling.       Left shoulder: He exhibits no tenderness, no bony tenderness and no swelling.       Right wrist: He exhibits no tenderness, no bony  tenderness and no swelling.       Left wrist: He exhibits no tenderness, no bony tenderness and no swelling.       Right hip: He exhibits normal range of motion, no tenderness, no bony tenderness and no swelling.       Left hip: He exhibits normal range of motion, no tenderness and no bony tenderness.       Right ankle: He exhibits no swelling. No tenderness.       Left ankle: He exhibits no swelling. No tenderness.       Cervical back: He exhibits no tenderness, no bony tenderness and no swelling.       Thoracic back: He exhibits no tenderness, no bony tenderness and no swelling.       Lumbar back: He exhibits no tenderness, no bony tenderness and no swelling.  Neurological: He  is alert. He has normal strength. No cranial nerve deficit (no facial droop, extraocular movements intact, no slurred speech) or sensory deficit. He exhibits normal muscle tone. He displays no seizure activity. Coordination normal.  Skin: Skin is warm and dry. No rash noted.  Psychiatric: He has a normal mood and affect.  Nursing note and vitals reviewed.    ED Treatments / Results  Labs (all labs ordered are listed, but only abnormal results are displayed) Labs Reviewed  CBC - Abnormal; Notable for the following components:      Result Value   RBC 4.15 (*)    All other components within normal limits  BASIC METABOLIC PANEL    EKG EKG Interpretation  Date/Time:  Sunday March 10 2018 20:30:12 EST Ventricular Rate:  62 PR Interval:    QRS Duration: 102 QT Interval:  390 QTC Calculation: 396 R Axis:   70 Text Interpretation:  Sinus rhythm diffuse st elevation Early repolarization (normal variant) Confirmed by Linwood DibblesKnapp, Aliena Ghrist [phone] on 03/10/2018 8:39:43 PM   Radiology Ct Head Wo Contrast  Result Date: 03/10/2018 CLINICAL DATA:  62 year old male with history of trauma from a fall. Abrasion to the right side of the face. EXAM: CT HEAD WITHOUT CONTRAST CT CERVICAL SPINE WITHOUT CONTRAST TECHNIQUE: Multidetector CT imaging of the head and cervical spine was performed following the standard protocol without intravenous contrast. Multiplanar CT image reconstructions of the cervical spine were also generated. COMPARISON:  Head CT 03/20/2017. FINDINGS: CT HEAD FINDINGS Brain: Moderate cerebral atrophy with ex vacuo dilatation of the ventricular system. Patchy and confluent areas of decreased attenuation are noted throughout the deep and periventricular white matter of the cerebral hemispheres bilaterally, compatible with chronic microvascular ischemic disease. No evidence of acute infarction, hemorrhage, hydrocephalus, extra-axial collection or mass lesion/mass effect. Vascular: No hyperdense  vessel or unexpected calcification. Skull: Normal. Negative for fracture or focal lesion. Surgical changes in the inferior wall of the right orbit anteriorly. Sinuses/Orbits: Multifocal mucosal thickening throughout the paranasal sinuses, most severe in the left maxillary sinus. No acute finding. Other: Small amount of right periorbital soft tissue swelling. CT CERVICAL SPINE FINDINGS Alignment: Normal. Skull base and vertebrae: No acute fracture. No primary bone lesion or focal pathologic process. Soft tissues and spinal canal: No prevertebral fluid or swelling. No visible canal hematoma. Disc levels: Mild multilevel degenerative disc disease and facet arthropathy. Upper chest: Negative. Other: None. IMPRESSION: 1. Small amount of right periorbital soft tissue swelling. No other evidence of significant acute traumatic injury to the skull, brain or cervical spine. 2. Moderate cerebral atrophy with ex vacuo dilatation of the ventricular system and chronic  microvascular ischemic changes in the cerebral white matter, similar to the prior study. 3. Mild multilevel degenerative disc disease and cervical spondylosis. Electronically Signed   By: Trudie Reed M.D.   On: 03/10/2018 20:48   Ct Cervical Spine Wo Contrast  Result Date: 03/10/2018 CLINICAL DATA:  62 year old male with history of trauma from a fall. Abrasion to the right side of the face. EXAM: CT HEAD WITHOUT CONTRAST CT CERVICAL SPINE WITHOUT CONTRAST TECHNIQUE: Multidetector CT imaging of the head and cervical spine was performed following the standard protocol without intravenous contrast. Multiplanar CT image reconstructions of the cervical spine were also generated. COMPARISON:  Head CT 03/20/2017. FINDINGS: CT HEAD FINDINGS Brain: Moderate cerebral atrophy with ex vacuo dilatation of the ventricular system. Patchy and confluent areas of decreased attenuation are noted throughout the deep and periventricular white matter of the cerebral hemispheres  bilaterally, compatible with chronic microvascular ischemic disease. No evidence of acute infarction, hemorrhage, hydrocephalus, extra-axial collection or mass lesion/mass effect. Vascular: No hyperdense vessel or unexpected calcification. Skull: Normal. Negative for fracture or focal lesion. Surgical changes in the inferior wall of the right orbit anteriorly. Sinuses/Orbits: Multifocal mucosal thickening throughout the paranasal sinuses, most severe in the left maxillary sinus. No acute finding. Other: Small amount of right periorbital soft tissue swelling. CT CERVICAL SPINE FINDINGS Alignment: Normal. Skull base and vertebrae: No acute fracture. No primary bone lesion or focal pathologic process. Soft tissues and spinal canal: No prevertebral fluid or swelling. No visible canal hematoma. Disc levels: Mild multilevel degenerative disc disease and facet arthropathy. Upper chest: Negative. Other: None. IMPRESSION: 1. Small amount of right periorbital soft tissue swelling. No other evidence of significant acute traumatic injury to the skull, brain or cervical spine. 2. Moderate cerebral atrophy with ex vacuo dilatation of the ventricular system and chronic microvascular ischemic changes in the cerebral white matter, similar to the prior study. 3. Mild multilevel degenerative disc disease and cervical spondylosis. Electronically Signed   By: Trudie Reed M.D.   On: 03/10/2018 20:48    Procedures Procedures (including critical care time)  Medications Ordered in ED Medications - No data to display   Initial Impression / Assessment and Plan / ED Course  I have reviewed the triage vital signs and the nursing notes.  Pertinent labs & imaging results that were available during my care of the patient were reviewed by me and considered in my medical decision making (see chart for details).   Patient presents to the ED after a fall.  No signs of any serious injury on CT scans.  Patient appears stable for  discharge back to the nursing facility.  Final Clinical Impressions(s) / ED Diagnoses   Final diagnoses:  Facial contusion, initial encounter    ED Discharge Orders    None       Linwood Dibbles, MD 03/10/18 2135

## 2018-03-10 NOTE — ED Notes (Signed)
Called ptar 

## 2018-03-10 NOTE — ED Notes (Signed)
I called pt wife, POA, Judeth CornfieldStephanie, and informed her of pt returning to facility.

## 2018-03-10 NOTE — Discharge Instructions (Addendum)
Take over-the-counter medications as needed for pain, ice to help with swelling

## 2018-03-10 NOTE — ED Triage Notes (Addendum)
Pt arrived EMS from [facility]. Pt was pacing in lobby of facility, started walking faster and tripped over his feet. Witnessed fall, pt fell directly on his face. No LOC, no blood thinners. Pt has alzheimer disease. Abrasion to right side of face noted. Pt Alert to self only (this is baseline)

## 2018-06-01 ENCOUNTER — Other Ambulatory Visit: Payer: Self-pay

## 2018-06-01 ENCOUNTER — Encounter (HOSPITAL_COMMUNITY): Payer: Self-pay

## 2018-06-01 ENCOUNTER — Emergency Department (HOSPITAL_COMMUNITY)
Admission: EM | Admit: 2018-06-01 | Discharge: 2018-06-02 | Disposition: A | Discharge status: Home / Self Care | Payer: BLUE CROSS/BLUE SHIELD | Attending: Emergency Medicine | Admitting: Emergency Medicine

## 2018-06-01 ENCOUNTER — Emergency Department (HOSPITAL_COMMUNITY): Payer: BLUE CROSS/BLUE SHIELD

## 2018-06-01 DIAGNOSIS — Y9302 Activity, running: Secondary | ICD-10-CM | POA: Insufficient documentation

## 2018-06-01 DIAGNOSIS — Y92128 Other place in nursing home as the place of occurrence of the external cause: Secondary | ICD-10-CM | POA: Insufficient documentation

## 2018-06-01 DIAGNOSIS — Z79899 Other long term (current) drug therapy: Secondary | ICD-10-CM | POA: Insufficient documentation

## 2018-06-01 DIAGNOSIS — S0990XA Unspecified injury of head, initial encounter: Secondary | ICD-10-CM | POA: Insufficient documentation

## 2018-06-01 DIAGNOSIS — Y999 Unspecified external cause status: Secondary | ICD-10-CM | POA: Diagnosis not present

## 2018-06-01 DIAGNOSIS — W19XXXA Unspecified fall, initial encounter: Secondary | ICD-10-CM

## 2018-06-01 DIAGNOSIS — G300 Alzheimer's disease with early onset: Secondary | ICD-10-CM | POA: Insufficient documentation

## 2018-06-01 DIAGNOSIS — W01198A Fall on same level from slipping, tripping and stumbling with subsequent striking against other object, initial encounter: Secondary | ICD-10-CM | POA: Diagnosis not present

## 2018-06-01 NOTE — ED Notes (Signed)
Wife Kenneth Mcdaniel) [phone] would like updates regarding her husband

## 2018-06-01 NOTE — ED Triage Notes (Addendum)
Per ptar: pt coming from [facility] after falling while skipping down the hall. A&Ox1 at baseline. No c/o pain, no loc, no blood thinners, no deformities noted

## 2018-06-01 NOTE — ED Notes (Signed)
Patient transported to CT 

## 2018-06-01 NOTE — ED Provider Notes (Signed)
[facility]-EMERGENCY DEPT Provider Note   CSN: [id number] Arrival date & time: 06/01/18  2139     History   Chief Complaint Chief Complaint  Patient presents with  . Fall    HPI Kenneth Mcdaniel is a 63 y.o. male.  Patient with early onset Alzheimer's presents to the ED from [facility] with a chief complaint of fall.  Patient had a witnessed fall in which he was running down the hallway, tripped and fell hitting his head on the wall.  He is demented at baseline.  He is not anticoagulated.  The fall happened after he had taken his evening xanax and trazadone.  Hx obtained from [place] from [facility], who is familiar with the patient.    The history is provided by the nursing home. No language interpreter was used.    Past Medical History:  Diagnosis Date  . Early onset Alzheimer's dementia (HCC)    from [facility]  . Hyperlipidemia     Patient Active Problem List   Diagnosis Date Noted  . Dementia (HCC) 03/21/2017    History reviewed. No pertinent surgical history.      Home Medications    Prior to Admission medications   Medication Sig Start Date End Date Taking? Authorizing Provider  ALPRAZolam Prudy Feeler) 0.5 MG tablet Take 1 tablet (0.5 mg total) by mouth 2 (two) times daily as needed for anxiety. 03/22/17   Laveda Abbe, NP  ketorolac (ACULAR) 0.5 % ophthalmic solution Place 1 drop into the left eye every 6 (six) hours. 04/26/17   McDonald, Mia A, PA-C  pravastatin (PRAVACHOL) 40 MG tablet Take 40 mg by mouth daily.    [provider]  QUEtiapine (SEROQUEL) 100 MG tablet Take 100 mg by mouth 2 (two) times daily.    [provider]    Family History No family history on file.  Social History Social History   Tobacco Use  . Smoking status: Not on file  Substance Use Topics  . Alcohol use: Not on file  . Drug use: No     Allergies   Patient has no known allergies.   Review of Systems Review of  Systems  All other systems reviewed and are negative.    Physical Exam Updated Vital Signs BP 101/70 (BP Location: Right Arm)   Pulse (!) 56   Temp 97.7 F (36.5 C) (Axillary)   Resp 18   SpO2 100%   Physical Exam Vitals signs and nursing note reviewed.  Constitutional:      Appearance: He is well-developed.  HENT:     Head: Normocephalic and atraumatic.     Comments: No evidence of trauma Eyes:     General: No scleral icterus.       Right eye: No discharge.        Left eye: No discharge.     Conjunctiva/sclera: Conjunctivae normal.     Pupils: Pupils are equal, round, and reactive to light.  Neck:     Musculoskeletal: Normal range of motion and neck supple.     Vascular: No JVD.  Cardiovascular:     Rate and Rhythm: Normal rate and regular rhythm.     Heart sounds: Normal heart sounds. No murmur. No friction rub. No gallop.   Pulmonary:     Effort: Pulmonary effort is normal. No respiratory distress.     Breath sounds: Normal breath sounds. No wheezing or rales.  Chest:     Chest wall: No tenderness.  Abdominal:     General: There is no distension.     Palpations: Abdomen is soft. There is no mass.     Tenderness: There is no abdominal tenderness. There is no guarding or rebound.  Musculoskeletal: Normal range of motion.        General: No tenderness.     Comments: Moves all extremities  Skin:    General: Skin is warm and dry.  Neurological:     Mental Status: He is alert.     Comments: Alert  Psychiatric:     Comments: Unable to assess      ED Treatments / Results  Labs (all labs ordered are listed, but only abnormal results are displayed) Labs Reviewed - No data to display  EKG None  Radiology Ct Head Wo Contrast  Result Date: 06/01/2018 CLINICAL DATA:  Fall, head injury, dementia. EXAM: CT HEAD WITHOUT CONTRAST TECHNIQUE: Contiguous axial images were obtained from the base of the skull through the vertex without intravenous contrast. COMPARISON:   Head CT dated 03/10/2018. FINDINGS: Brain: Generalized age related parenchymal volume loss with commensurate dilatation of the ventricles and sulci. Mild chronic small vessel ischemic changes within the deep periventricular white matter regions No mass, hemorrhage, edema or other evidence of acute parenchymal abnormality. No extra-axial hemorrhage. Vascular: No hyperdense vessel or unexpected calcification. Skull: Normal. Negative for fracture or focal lesion. Sinuses/Orbits: No acute finding. Other: None. IMPRESSION: 1. No acute findings. No intracranial mass, hemorrhage or edema. No skull fracture. 2. Mild chronic small vessel ischemic changes in the periventricular white matter. Electronically Signed   By: Bary Richard M.D.   On: 06/01/2018 23:48    Procedures Procedures (including critical care time)  Medications Ordered in ED Medications - No data to display   Initial Impression / Assessment and Plan / ED Course  I have reviewed the triage vital signs and the nursing notes.  Pertinent labs & imaging results that were available during my care of the patient were reviewed by me and considered in my medical decision making (see chart for details).     Patient with trip and fall at his care center.  He hit his head on the way down.  No evidence of trauma.  CT is negative for acute findings.  Patient is at his baseline.  We will discharged home.  Final Clinical Impressions(s) / ED Diagnoses   Final diagnoses:  Fall, initial encounter    ED Discharge Orders    None       Roxy Horseman, PA-C 06/02/18 0505    Linwood Dibbles, MD 06/02/18 [id number]

## 2018-06-02 NOTE — ED Notes (Signed)
PT[facility] at bedside 

## 2018-06-02 NOTE — ED Notes (Signed)
[facility] contacted to take pt to [facility]. Paperwork printed

## 2018-06-02 NOTE — ED Notes (Signed)
Report given to Felipa Eth, Charity fundraiser at [facility]

## 2018-06-02 NOTE — ED Notes (Signed)
Judeth Cornfield (wife) called with updates

## 2018-06-02 NOTE — Discharge Instructions (Addendum)
Your emergency department workup is complete.  No traumatic injury was found.  Please follow-up with your doctor.

## 2018-08-14 ENCOUNTER — Emergency Department (HOSPITAL_COMMUNITY)
Admission: EM | Admit: 2018-08-14 | Discharge: 2018-08-14 | Disposition: A | Discharge status: Home / Self Care | Payer: BLUE CROSS/BLUE SHIELD | Attending: Emergency Medicine | Admitting: Emergency Medicine

## 2018-08-14 ENCOUNTER — Emergency Department (HOSPITAL_COMMUNITY): Payer: BLUE CROSS/BLUE SHIELD

## 2018-08-14 ENCOUNTER — Other Ambulatory Visit: Payer: Self-pay

## 2018-08-14 DIAGNOSIS — Y999 Unspecified external cause status: Secondary | ICD-10-CM | POA: Insufficient documentation

## 2018-08-14 DIAGNOSIS — S0083XA Contusion of other part of head, initial encounter: Secondary | ICD-10-CM | POA: Insufficient documentation

## 2018-08-14 DIAGNOSIS — S025XXA Fracture of tooth (traumatic), initial encounter for closed fracture: Secondary | ICD-10-CM | POA: Insufficient documentation

## 2018-08-14 DIAGNOSIS — G309 Alzheimer's disease, unspecified: Secondary | ICD-10-CM | POA: Insufficient documentation

## 2018-08-14 DIAGNOSIS — F0280 Dementia in other diseases classified elsewhere without behavioral disturbance: Secondary | ICD-10-CM | POA: Diagnosis not present

## 2018-08-14 DIAGNOSIS — W010XXA Fall on same level from slipping, tripping and stumbling without subsequent striking against object, initial encounter: Secondary | ICD-10-CM | POA: Insufficient documentation

## 2018-08-14 DIAGNOSIS — Y92129 Unspecified place in nursing home as the place of occurrence of the external cause: Secondary | ICD-10-CM | POA: Diagnosis not present

## 2018-08-14 DIAGNOSIS — S0990XA Unspecified injury of head, initial encounter: Secondary | ICD-10-CM

## 2018-08-14 DIAGNOSIS — S01511A Laceration without foreign body of lip, initial encounter: Secondary | ICD-10-CM | POA: Insufficient documentation

## 2018-08-14 DIAGNOSIS — Z79899 Other long term (current) drug therapy: Secondary | ICD-10-CM | POA: Diagnosis not present

## 2018-08-14 DIAGNOSIS — Y939 Activity, unspecified: Secondary | ICD-10-CM | POA: Diagnosis not present

## 2018-08-14 DIAGNOSIS — S0993XA Unspecified injury of face, initial encounter: Secondary | ICD-10-CM | POA: Diagnosis present

## 2018-08-14 LAB — URINALYSIS, ROUTINE W REFLEX MICROSCOPIC
Bilirubin Urine: NEGATIVE
Glucose, UA: NEGATIVE mg/dL
Hgb urine dipstick: NEGATIVE
Ketones, ur: 5 mg/dL — AB
Leukocytes,Ua: NEGATIVE
Nitrite: NEGATIVE
Protein, ur: NEGATIVE mg/dL
Specific Gravity, Urine: 1.025 (ref 1.005–1.030)
pH: 6 (ref 5.0–8.0)

## 2018-08-14 NOTE — ED Notes (Signed)
Pt facility, [facility], called and spoke to nurse. Discharge instructions reviewed, verbalized understanding and no questions at this time.

## 2018-08-14 NOTE — ED Provider Notes (Signed)
[facility]-EMERGENCY DEPT Provider Note   CSN: [id number] Arrival date & time: 08/14/18  1739    History   Chief Complaint Chief Complaint  Patient presents with   Fall   Facial Injury    HPI Kenneth Mcdaniel is a 63 y.o. male.     63yo M w/ PMH including dementia, HLD who p/w falls. Pt has had 4 falls today at nursing facility. Staff reports he broke his front tooth during the 3rd fall, then fell again about 10 min later. 2 of the falls were unwitnessed; staff heard fall and came to patient within seconds of occurrence. Pt is not on anticoagulation and is reportedly only oriented to self at baseline.  The history is provided by the EMS personnel and the nursing home.  Fall   Facial Injury    Past Medical History:  Diagnosis Date   Early onset Alzheimer's dementia (HCC)    from [facility]   Hyperlipidemia     Patient Active Problem List   Diagnosis Date Noted   Dementia (HCC) 03/21/2017    No past surgical history on file.      Home Medications    Prior to Admission medications   Medication Sig Start Date End Date Taking? Authorizing Provider  ALPRAZolam Prudy Feeler) 0.5 MG tablet Take 1 tablet (0.5 mg total) by mouth 2 (two) times daily as needed for anxiety. 03/22/17   Laveda Abbe, NP  ketorolac (ACULAR) 0.5 % ophthalmic solution Place 1 drop into the left eye every 6 (six) hours. 04/26/17   McDonald, Mia A, PA-C  pravastatin (PRAVACHOL) 40 MG tablet Take 40 mg by mouth daily.    [provider]  QUEtiapine (SEROQUEL) 100 MG tablet Take 100 mg by mouth 2 (two) times daily.    [provider]    Family History No family history on file.  Social History Social History   Tobacco Use   Smoking status: Not on file  Substance Use Topics   Alcohol use: Not on file   Drug use: No     Allergies   Patient has no known allergies.   Review of Systems Review of Systems  Unable to perform ROS:  Dementia     Physical Exam Updated Vital Signs BP 137/86    Pulse 66    Temp 97.9 F (36.6 C) (Axillary)    Resp 18    SpO2 96%   Physical Exam Vitals signs and nursing note reviewed.  Constitutional:      General: He is not in acute distress.    Appearance: He is well-developed.  HENT:     Head: Normocephalic.     Comments: Large hematoma with forming ecchymosis on R forehead above eyebrow    Nose: Nose normal.     Mouth/Throat:     Mouth: Mucous membranes are moist.     Comments: L central incisor fractured almost to gumline; superficial laceration inside of lower lip; concussed R central incisor without severe laxity Eyes:     Conjunctiva/sclera: Conjunctivae normal.     Pupils: Pupils are equal, round, and reactive to light.  Neck:     Comments: In c-collar; trachea midline Cardiovascular:     Pulses: Normal pulses.  Pulmonary:     Effort: Pulmonary effort is normal.  Abdominal:     General: Abdomen is flat. There is no distension.     Palpations: Abdomen is soft.     Tenderness: There is no abdominal tenderness.  Musculoskeletal:  Normal range of motion.        General: No tenderness, deformity or signs of injury.  Skin:    General: Skin is warm and dry.  Neurological:     Mental Status: He is alert.     Comments: Alert, unable to answer questions/follow commands, mumbling to self      ED Treatments / Results  Labs (all labs ordered are listed, but only abnormal results are displayed) Labs Reviewed  URINALYSIS, ROUTINE W REFLEX MICROSCOPIC - Abnormal; Notable for the following components:      Result Value   Ketones, ur 5 (*)    All other components within normal limits    EKG None  Radiology Dg Chest 2 View  Result Date: 08/14/2018 CLINICAL DATA:  Multiple falls.  Dementia.  On blood thinners. EXAM: CHEST - 2 VIEW COMPARISON:  03/21/2017 FINDINGS: Lateral view degraded by patient arm position. Midthoracic spondylosis. Midline trachea. Normal heart  size and mediastinal contours. No pleural effusion or pneumothorax. Artifact projects over the apices. Clear lungs. IMPRESSION: No acute cardiopulmonary disease. Electronically Signed   By: Kenneth Mcdaniel M.D.   On: 08/14/2018 18:40   Ct Head Wo Contrast  Result Date: 08/14/2018 CLINICAL DATA:  63 year old male with a fall EXAM: CT HEAD WITHOUT CONTRAST CT CERVICAL SPINE WITHOUT CONTRAST TECHNIQUE: Multidetector CT imaging of the head and cervical spine was performed following the standard protocol without intravenous contrast. Multiplanar CT image reconstructions of the cervical spine were also generated. COMPARISON:  None. FINDINGS: CT HEAD FINDINGS Brain: No acute intracranial hemorrhage. No midline shift or mass effect. Gray-white differentiation maintained. Senescent volume loss. Expansion of the ventricular system is similar to the comparison. Mild patchy hypodensity in the white matter. Vascular: Minimal intracranial atherosclerosis Skull: No skull fracture. Hematoma of the right frontal scalp, supraorbital region. Hematoma measures 8 cm by 1.8 cm. Sinuses/Orbits: Surgical changes of the right inferior orbital rim, incompletely imaged. Trace mucosal disease of the left maxillary sinus. Ethmoid air cell opacification and trace frontal sinus opacification. Other: Debris within the right external auditory canal CT CERVICAL SPINE FINDINGS Alignment: Reversal of normal cervical lordosis.  No subluxation. Skull base and vertebrae: No skull base fracture. Craniocervical junction aligned. No fracture of vertebral bodies. Soft tissues and spinal canal: Unremarkable cervical soft tissues Disc levels: Mild disc space narrowing throughout cervical spine with greatest degree of uncovertebral joint disease at C3-C4 contributing to mild bilateral foraminal narrowing. Upper chest: Unremarkable Other: No canal hematoma.  Carotid calcifications IMPRESSION: Head CT: No acute intracranial abnormality. Right frontal scalp  hematoma in the supraorbital region with no underlying fracture. Redemonstration of senescent volume loss and chronic microvascular ischemic disease. Cervical CT: No acute fracture or malalignment of the cervical spine. Electronically Signed   By: Kenneth Mcdaniel D.O.   On: 08/14/2018 19:06   Ct Cervical Spine Wo Contrast  Result Date: 08/14/2018 CLINICAL DATA:  63 year old male with a fall EXAM: CT HEAD WITHOUT CONTRAST CT CERVICAL SPINE WITHOUT CONTRAST TECHNIQUE: Multidetector CT imaging of the head and cervical spine was performed following the standard protocol without intravenous contrast. Multiplanar CT image reconstructions of the cervical spine were also generated. COMPARISON:  None. FINDINGS: CT HEAD FINDINGS Brain: No acute intracranial hemorrhage. No midline shift or mass effect. Gray-white differentiation maintained. Senescent volume loss. Expansion of the ventricular system is similar to the comparison. Mild patchy hypodensity in the white matter. Vascular: Minimal intracranial atherosclerosis Skull: No skull fracture. Hematoma of the right frontal scalp, supraorbital region. Hematoma  measures 8 cm by 1.8 cm. Sinuses/Orbits: Surgical changes of the right inferior orbital rim, incompletely imaged. Trace mucosal disease of the left maxillary sinus. Ethmoid air cell opacification and trace frontal sinus opacification. Other: Debris within the right external auditory canal CT CERVICAL SPINE FINDINGS Alignment: Reversal of normal cervical lordosis.  No subluxation. Skull base and vertebrae: No skull base fracture. Craniocervical junction aligned. No fracture of vertebral bodies. Soft tissues and spinal canal: Unremarkable cervical soft tissues Disc levels: Mild disc space narrowing throughout cervical spine with greatest degree of uncovertebral joint disease at C3-C4 contributing to mild bilateral foraminal narrowing. Upper chest: Unremarkable Other: No canal hematoma.  Carotid calcifications IMPRESSION:  Head CT: No acute intracranial abnormality. Right frontal scalp hematoma in the supraorbital region with no underlying fracture. Redemonstration of senescent volume loss and chronic microvascular ischemic disease. Cervical CT: No acute fracture or malalignment of the cervical spine. Electronically Signed   By: Kenneth MorJaime  Wagner D.O.   On: 08/14/2018 19:06    Procedures Procedures (including critical care time)  Medications Ordered in ED Medications - No data to display   Initial Impression / Assessment and Plan / ED Course  I have reviewed the triage vital signs and the nursing notes.  Pertinent labs & imaging results that were available during my care of the patient were reviewed by me and considered in my medical decision making (see chart for details).        PT alert and comfortable on exam, reassuring VS. CT of head and C-spine negative for intracranial or cervical spine injury.  Screening chest x-ray unremarkable.  I did obtain a urinalysis given his frequent falls, which did not show any signs of infection.  He remains comfortable on reassessment.  PT will need dental f/u for injuries, have recommended soft diet until then.  Patient discharged back to nursing facility.  Final Clinical Impressions(s) / ED Diagnoses   Final diagnoses:  Contusion of face, initial encounter  Closed head injury, initial encounter  Closed broken tooth due to trauma without complication, initial encounter    ED Discharge Orders    None       Ellisa Devivo, Ambrose Finlandachel Morgan, MD 08/14/18 2316

## 2018-08-14 NOTE — ED Notes (Signed)
Pt wife called, Tuyen Laconte, to review discharge instructions. Wife verbalized understanding and has no questions at this time.

## 2018-08-14 NOTE — ED Notes (Signed)
Patient transported to CT 

## 2018-08-14 NOTE — ED Triage Notes (Addendum)
Pt had 4 falls today, starting at 16:30 today.  Pt broke tooth during third fall.  Staff threw tooth away.  10 minutes later pt had the fourth fall, causing hematoma to forehead.  Last 2 falls were unwitnessed, bur staff heard fall and where at pt's side within seconds.  Pt not on blood thinners, pt hx of dementia and alert to self. Pt has C-collar on.

## 2018-08-14 NOTE — Discharge Instructions (Signed)
Kenneth Mcdaniel broke his front tooth. He will need to be on a soft diet and he needs to avoid biting food (such as sandwiches, pizza, etc) until he follows up with a dentist. His pictures of head and neck were reassuring. Return to ER if any vomiting, sudden changes in mental status, lethargy.

## 2018-08-14 NOTE — ED Notes (Signed)
pT returned from CT

## 2018-08-14 NOTE — ED Notes (Signed)
[facility] called for transport back to facility 

## 2018-08-14 NOTE — ED Notes (Signed)
Bed: DX83 Expected date:  Expected time:  Means of arrival:  Comments: EMS falls from nursing home

## 2018-10-02 ENCOUNTER — Emergency Department (HOSPITAL_COMMUNITY): Payer: BC Managed Care – PPO

## 2018-10-02 ENCOUNTER — Encounter (HOSPITAL_COMMUNITY): Payer: Self-pay | Admitting: Emergency Medicine

## 2018-10-02 ENCOUNTER — Emergency Department (HOSPITAL_COMMUNITY)
Admission: EM | Admit: 2018-10-02 | Discharge: 2018-10-03 | Disposition: A | Discharge status: Home / Self Care | Payer: BC Managed Care – PPO | Attending: Emergency Medicine | Admitting: Emergency Medicine

## 2018-10-02 ENCOUNTER — Other Ambulatory Visit: Payer: Self-pay

## 2018-10-02 DIAGNOSIS — F0390 Unspecified dementia without behavioral disturbance: Secondary | ICD-10-CM | POA: Insufficient documentation

## 2018-10-02 DIAGNOSIS — W1839XA Other fall on same level, initial encounter: Secondary | ICD-10-CM | POA: Diagnosis not present

## 2018-10-02 DIAGNOSIS — Y92129 Unspecified place in nursing home as the place of occurrence of the external cause: Secondary | ICD-10-CM | POA: Insufficient documentation

## 2018-10-02 DIAGNOSIS — Z79899 Other long term (current) drug therapy: Secondary | ICD-10-CM | POA: Insufficient documentation

## 2018-10-02 DIAGNOSIS — R569 Unspecified convulsions: Secondary | ICD-10-CM

## 2018-10-02 DIAGNOSIS — Y999 Unspecified external cause status: Secondary | ICD-10-CM | POA: Insufficient documentation

## 2018-10-02 DIAGNOSIS — S0081XA Abrasion of other part of head, initial encounter: Secondary | ICD-10-CM | POA: Insufficient documentation

## 2018-10-02 DIAGNOSIS — Y939 Activity, unspecified: Secondary | ICD-10-CM | POA: Diagnosis not present

## 2018-10-02 DIAGNOSIS — S098XXA Other specified injuries of head, initial encounter: Secondary | ICD-10-CM | POA: Diagnosis present

## 2018-10-02 LAB — URINALYSIS, ROUTINE W REFLEX MICROSCOPIC
Bilirubin Urine: NEGATIVE
Glucose, UA: NEGATIVE mg/dL
Hgb urine dipstick: NEGATIVE
Ketones, ur: 5 mg/dL — AB
Leukocytes,Ua: NEGATIVE
Nitrite: NEGATIVE
Protein, ur: 100 mg/dL — AB
Specific Gravity, Urine: 1.021 (ref 1.005–1.030)
pH: 5 (ref 5.0–8.0)

## 2018-10-02 LAB — COMPREHENSIVE METABOLIC PANEL
ALT: 17 U/L (ref 0–44)
AST: 23 U/L (ref 15–41)
Albumin: 3.7 g/dL (ref 3.5–5.0)
Alkaline Phosphatase: 40 U/L (ref 38–126)
Anion gap: 9 (ref 5–15)
BUN: 13 mg/dL (ref 8–23)
CO2: 25 mmol/L (ref 22–32)
Calcium: 9.4 mg/dL (ref 8.9–10.3)
Chloride: 104 mmol/L (ref 98–111)
Creatinine, Ser: 0.98 mg/dL (ref 0.61–1.24)
GFR calc Af Amer: >60 mL/min (ref >60)
GFR calc non Af Amer: >60 mL/min (ref >60)
Glucose, Bld: 139 mg/dL — ABNORMAL HIGH (ref 70–99)
Potassium: 4.3 mmol/L (ref 3.5–5.1)
Sodium: 138 mmol/L (ref 135–145)
Total Bilirubin: 1.3 mg/dL — ABNORMAL HIGH (ref 0.3–1.2)
Total Protein: 6.4 g/dL — ABNORMAL LOW (ref 6.5–8.1)

## 2018-10-02 LAB — MAGNESIUM: Magnesium: 2 mg/dL (ref 1.7–2.4)

## 2018-10-02 LAB — CBC WITH DIFFERENTIAL/PLATELET
Abs Immature Granulocytes: 0.06 10*3/uL (ref 0.00–0.07)
Basophils Absolute: 0 10*3/uL (ref 0.0–0.1)
Basophils Relative: 0 %
Eosinophils Absolute: 0.2 10*3/uL (ref 0.0–0.5)
Eosinophils Relative: 2 %
HCT: 39.2 % (ref 39.0–52.0)
Hemoglobin: 13.4 g/dL (ref 13.0–17.0)
Immature Granulocytes: 1 %
Lymphocytes Relative: 10 %
Lymphs Abs: 0.9 10*3/uL (ref 0.7–4.0)
MCH: 31.6 pg (ref 26.0–34.0)
MCHC: 34.2 g/dL (ref 30.0–36.0)
MCV: 92.5 fL (ref 80.0–100.0)
Monocytes Absolute: 0.8 10*3/uL (ref 0.1–1.0)
Monocytes Relative: 9 %
Neutro Abs: 7 10*3/uL (ref 1.7–7.7)
Neutrophils Relative %: 78 %
Platelets: 199 10*3/uL (ref 150–400)
RBC: 4.24 MIL/uL (ref 4.22–5.81)
RDW: 11.7 % (ref 11.5–15.5)
WBC: 9 10*3/uL (ref 4.0–10.5)
nRBC: 0 % (ref 0.0–0.2)

## 2018-10-02 LAB — RAPID URINE DRUG SCREEN, HOSP PERFORMED
Amphetamines: NOT DETECTED
Barbiturates: NOT DETECTED
Benzodiazepines: POSITIVE — AB
Cocaine: NOT DETECTED
Opiates: NOT DETECTED
Tetrahydrocannabinol: NOT DETECTED

## 2018-10-02 NOTE — ED Provider Notes (Signed)
[facility] EMERGENCY DEPARTMENT Provider Note   CSN: [id number] Arrival date & time: 10/02/18  2132    History   Chief Complaint Chief Complaint  Patient presents with   Seizures   Fall    LEVEL 5 CAVEAT 2/2 DEMENTIA  HPI CHING RABIDEAU is a 63 y.o. male.    63 year old male with history of early onset Alzheimer's dementia presents to the emergency department from [facility] after he reportedly had a seizure causing him to fall and strike his head.  Per EMS, patient had 2 grand mal type seizures.  No medications were noted to be given prior to arrival.  He was placed in a c-collar during transport.  Was combative en route, but is currently calm.  No hx of seizure disorder.  Not on chronic anticoagulation.   Patient oriented to person only at baseline.  The history is provided by the patient. No language interpreter was used.  Seizures Fall    Past Medical History:  Diagnosis Date   Early onset Alzheimer's dementia ([place])    from [place]   Hyperlipidemia     Patient Active Problem List   Diagnosis Date Noted   Dementia ([place]) 03/21/2017    History reviewed. No pertinent surgical history.      Home Medications    Prior to Admission medications   Medication Sig Start Date End Date Taking? Authorizing Provider  ALPRAZolam [name]) 0.5 MG tablet Take 1 tablet (0.5 mg total) by mouth 2 (two) times daily as needed for anxiety. Patient taking differently: Take 0.5 mg by mouth 2 (two) times daily.  03/22/17  Yes Ethelene Hal, NP  ALPRAZolam [name]) 1 MG tablet Take 1 mg by mouth at bedtime.    Yes [provider]  feeding supplement, GLUCERNA SHAKE, (GLUCERNA SHAKE) LIQD Take 237 mLs by mouth See admin instructions. Drink 1 shake (237 ml's) by mouth three times a day and with any snacks (CHOCOLATE)   Yes [provider]  traZODone (DESYREL) 100 MG tablet Take 200 mg by mouth at bedtime.  08/16/18  Yes [provider]  divalproex (DEPAKOTE SPRINKLE) 125 MG capsule Take 250 mg by mouth 2 (two) times a day. 08/16/18 10/03/18 Yes [provider]  ketorolac (ACULAR) 0.5 % ophthalmic solution Place 1 drop into the left eye every 6 (six) hours. Patient not taking: Reported on 10/02/2018 04/26/17   McDonald, Mia A, PA-C  valproic acid (DEPAKENE) 250 MG/5ML SOLN solution Take 7 mLs (350 mg total) by mouth 3 (three) times daily. 10/03/18   Antonietta Breach, PA-C    Family History History reviewed. No pertinent family history.  Social History Social History   Tobacco Use   Smoking status: Not on file  Substance Use Topics   Alcohol use: Not on file   Drug use: No     Allergies   Patient has no known allergies.   Review of Systems Review of Systems  Unable to perform ROS: Dementia  Neurological: Positive for seizures.    Physical Exam Updated Vital Signs BP (!) 120/93    Pulse 71    Temp 98.4 F (36.9 C) (Oral)    Resp 13    Ht 5\' 9"  (1.753 m)    Wt 70.3 kg    SpO2 100%    BMI 22.89 kg/m   Physical Exam Vitals signs and nursing note reviewed.  Constitutional:      General: He is not in acute distress.  Appearance: He is well-developed. He is not diaphoretic.     Comments: Patient calm and in NAD  HENT:     Head: Normocephalic.     Comments: Abrasion to left cheek. No battle's sign or raccoon's eyes. No obvious tongue biting. Eyes:     General: No scleral icterus.    Extraocular Movements: Extraocular movements intact.     Conjunctiva/sclera: Conjunctivae normal.     Pupils: Pupils are equal, round, and reactive to light.     Comments: Appropriate tracking.  Neck:     Comments: C-collar applied by EMS Cardiovascular:     Rate and Rhythm: Normal rate and regular rhythm.     Pulses: Normal pulses.  Pulmonary:     Effort: Pulmonary effort is normal. No respiratory distress.     Breath sounds: No stridor. No wheezing.     Comments: Respirations even and  unlabored Musculoskeletal: Normal range of motion.  Skin:    General: Skin is warm and dry.     Coloration: Skin is not pale.     Findings: No erythema or rash.     Comments: Abrasions bilateral knees  Neurological:     General: No focal deficit present.     Mental Status: He is alert.     Comments: No obvious focal deficits. Moving all extremities spontaneously. Exam limited 2/2 dementia and cooperation.  Psychiatric:        Behavior: Behavior normal.      ED Treatments / Results  Labs (all labs ordered are listed, but only abnormal results are displayed) Labs Reviewed  COMPREHENSIVE METABOLIC PANEL - Abnormal; Notable for the following components:      Result Value   Glucose, Bld 139 (*)    Total Protein 6.4 (*)    Total Bilirubin 1.3 (*)    All other components within normal limits  URINALYSIS, ROUTINE W REFLEX MICROSCOPIC - Abnormal; Notable for the following components:   APPearance HAZY (*)    Ketones, ur 5 (*)    Protein, ur 100 (*)    Bacteria, UA RARE (*)    All other components within normal limits  RAPID URINE DRUG SCREEN, HOSP PERFORMED - Abnormal; Notable for the following components:   Benzodiazepines POSITIVE (*)    All other components within normal limits  VALPROIC ACID LEVEL - Abnormal; Notable for the following components:   Valproic Acid Lvl <10 (*)    All other components within normal limits  CBC WITH DIFFERENTIAL/PLATELET  MAGNESIUM  CBG MONITORING, ED    EKG EKG Interpretation  Date/Time:  Wednesday October 02 2018 22:05:34 EDT Ventricular Rate:  71 PR Interval:    QRS Duration: 106 QT Interval:  377 QTC Calculation: 410 R Axis:   79 Text Interpretation:  sinus rhythm Borderline ST elevation, anterior leads similar to previous Confirmed by Marily MemosMesner, Jason [phone]([id number]) on 10/02/2018 11:54:10 PM   Radiology Ct Head Wo Contrast  Result Date: 10/02/2018 CLINICAL DATA:  Seizure, fall, hit head EXAM: CT HEAD WITHOUT CONTRAST CT CERVICAL SPINE WITHOUT  CONTRAST TECHNIQUE: Multidetector CT imaging of the head and cervical spine was performed following the standard protocol without intravenous contrast. Multiplanar CT image reconstructions of the cervical spine were also generated. COMPARISON:  08/14/2018 FINDINGS: CT HEAD FINDINGS Brain: There is atrophy and chronic small vessel disease changes. Associated ventriculomegaly, stable. No hemorrhage or acute infarction. No midline shift. Vascular: No hyperdense vessel or unexpected calcification. Skull: No acute calvarial abnormality. Sinuses/Orbits: Mucosal thickening throughout the paranasal sinuses. Postoperative changes  along the right inferior orbital rim. No acute findings. Other: None CT CERVICAL SPINE FINDINGS Alignment: Normal Skull base and vertebrae: No acute fracture. No primary bone lesion or focal pathologic process. Soft tissues and spinal canal: No prevertebral fluid or swelling. No visible canal hematoma. Disc levels: Early degenerative narrowing disc disease with and spurring disc space at C4-5 through C6-7. Mild degenerative facet disease bilaterally. Upper chest: No acute findings Other: None IMPRESSION: Atrophy, chronic microvascular disease. No acute intracranial abnormality. Degenerative disc and facet disease in the cervical spine. No acute bony abnormality. Electronically Signed   By: Charlett NoseKevin  Dover M.D.   On: 10/02/2018 22:58   Ct Cervical Spine Wo Contrast  Result Date: 10/02/2018 CLINICAL DATA:  Seizure, fall, hit head EXAM: CT HEAD WITHOUT CONTRAST CT CERVICAL SPINE WITHOUT CONTRAST TECHNIQUE: Multidetector CT imaging of the head and cervical spine was performed following the standard protocol without intravenous contrast. Multiplanar CT image reconstructions of the cervical spine were also generated. COMPARISON:  08/14/2018 FINDINGS: CT HEAD FINDINGS Brain: There is atrophy and chronic small vessel disease changes. Associated ventriculomegaly, stable. No hemorrhage or acute infarction.  No midline shift. Vascular: No hyperdense vessel or unexpected calcification. Skull: No acute calvarial abnormality. Sinuses/Orbits: Mucosal thickening throughout the paranasal sinuses. Postoperative changes along the right inferior orbital rim. No acute findings. Other: None CT CERVICAL SPINE FINDINGS Alignment: Normal Skull base and vertebrae: No acute fracture. No primary bone lesion or focal pathologic process. Soft tissues and spinal canal: No prevertebral fluid or swelling. No visible canal hematoma. Disc levels: Early degenerative narrowing disc disease with and spurring disc space at C4-5 through C6-7. Mild degenerative facet disease bilaterally. Upper chest: No acute findings Other: None IMPRESSION: Atrophy, chronic microvascular disease. No acute intracranial abnormality. Degenerative disc and facet disease in the cervical spine. No acute bony abnormality. Electronically Signed   By: Charlett NoseKevin  Dover M.D.   On: 10/02/2018 22:58   Dg Chest Port 1 View  Result Date: 10/02/2018 CLINICAL DATA:  Seizure EXAM: PORTABLE CHEST 1 VIEW COMPARISON:  08/14/2018 FINDINGS: Heart and mediastinal contours are within normal limits. No focal opacities or effusions. No acute bony abnormality. IMPRESSION: No active disease. Electronically Signed   By: Charlett NoseKevin  Dover M.D.   On: 10/02/2018 22:26    Procedures Procedures (including critical care time)  Medications Ordered in ED Medications  valproate (DEPACON) 1,400 mg in dextrose 5 % 50 mL IVPB (1,400 mg Intravenous New Bag/Given 10/03/18 0458)     Initial Impression / Assessment and Plan / ED Course  I have reviewed the triage vital signs and the nursing notes.  Pertinent labs & imaging results that were available during my care of the patient were reviewed by me and considered in my medical decision making (see chart for details).        5210:7415 PM 63 year old male with a history of Alzheimer's dementia presents to the emergency department following  seizure-like activity.  Facility reports to episodes of grand mal-type seizure activity prior to arrival.  No evidence of tongue biting.  Patient with abrasion to the left cheek and right knee.  Will obtain labs as well as CT head and cervical spine imaging.  Orders placed for seizure precautions.  12:00 AM  Labs and imaging reviewed.  Largely no acute abnormalities from baseline. Will continue to monitor.  No repeat seizure activity since arrival.  2:20 AM [facility].  He received all 3 doses yesterday as prescribed.  He is on Depakote, but facility reports this is for mental status as they confirm no history of seizures.  For clarification, EMS did not witness any seizure-like activity.  Facility endorsed to grand mal-type seizures today.  3:40 AM Case discussed with Dr. Otelia LimesLindzen of neurology.  States that patient does meet criteria for antiepileptic maintenance pending outpatient EEG and MRI.  Recommends increase in Depakote level to 350 mg 3 times daily with initial 1400 mg loading dose in the emergency department.  5:52 AM Depakote load completed. Will discharge back to facility via [facility].   Final Clinical Impressions(s) / ED Diagnoses   Final diagnoses:  Seizure-like activity [facility](HCC)    ED Discharge Orders         Ordered    valproic acid (DEPAKENE) 250 MG/5ML SOLN solution  3 times daily     10/03/18 0543           Antony MaduraHumes, Ashritha [place], PA-C 10/03/18 0555    Mesner, Barbara CowerJason, MD 10/03/18 [phone]

## 2018-10-02 NOTE — ED Triage Notes (Signed)
  Patient BIB EMS from [facility] after he had a seizure causing him to fall and hit his head.  Patient has severe dementia and is combative.  Patient had two grand mal seizures per EMS and was placed in a C-collar.  Patient is only oriented to person and this is his baseline.  Vitals WNL. Patient has small abrasion to R knee and small cut to L cheek from his fingernail while being combative.

## 2018-10-03 LAB — CBG MONITORING, ED: Glucose-Capillary: 161 mg/dL — ABNORMAL HIGH (ref 70–99)

## 2018-10-03 LAB — VALPROIC ACID LEVEL: Valproic Acid Lvl: <10 ug/mL — ABNORMAL LOW (ref 50.0–100.0)

## 2018-10-03 MED ORDER — VALPROATE SODIUM 500 MG/5ML IV SOLN
1400.0000 mg | Freq: Once | INTRAVENOUS | Status: AC
  Administered 2018-10-03: 1400 mg via INTRAVENOUS
  Filled 2018-10-03 (×2): qty 14

## 2018-10-03 MED ORDER — VALPROIC ACID 250 MG/5ML PO SOLN: 350.0000 mg | Freq: Three times a day (TID) | ORAL | 1 refills | Status: DC

## 2018-10-03 NOTE — ED Notes (Signed)
Patient verbalizes understanding of discharge instructions. Opportunity for questioning and answers were provided. Armband removed by staff, pt discharged from ED. Awaiting [facility] for transport back to [facility]

## 2018-10-03 NOTE — Discharge Instructions (Signed)
Follow-up with a neurologist as soon as you are able.  You should have an outpatient MRI and EEG performed.  Your Depakote medication dosing has been changed.  Start taking 350 mg Depakote every 8 hours.  Continue your other medications as prescribed.  Return to the ED for new or concerning symptoms.

## 2018-11-20 ENCOUNTER — Other Ambulatory Visit: Payer: Self-pay

## 2018-11-20 ENCOUNTER — Emergency Department (HOSPITAL_COMMUNITY): Payer: BC Managed Care – PPO

## 2018-11-20 ENCOUNTER — Observation Stay (HOSPITAL_COMMUNITY)
Admission: EM | Admit: 2018-11-20 | Discharge: 2018-11-23 | Disposition: A | Discharge status: Rehabilitation Facility | Payer: BC Managed Care – PPO | Attending: Family Medicine | Admitting: Family Medicine

## 2018-11-20 ENCOUNTER — Encounter (HOSPITAL_COMMUNITY): Payer: Self-pay

## 2018-11-20 DIAGNOSIS — R8290 Unspecified abnormal findings in urine: Secondary | ICD-10-CM | POA: Insufficient documentation

## 2018-11-20 DIAGNOSIS — N390 Urinary tract infection, site not specified: Secondary | ICD-10-CM | POA: Diagnosis not present

## 2018-11-20 DIAGNOSIS — N3000 Acute cystitis without hematuria: Secondary | ICD-10-CM | POA: Diagnosis not present

## 2018-11-20 DIAGNOSIS — F0391 Unspecified dementia with behavioral disturbance: Secondary | ICD-10-CM

## 2018-11-20 DIAGNOSIS — Z66 Do not resuscitate: Secondary | ICD-10-CM | POA: Insufficient documentation

## 2018-11-20 DIAGNOSIS — R531 Weakness: Secondary | ICD-10-CM

## 2018-11-20 DIAGNOSIS — Z20828 Contact with and (suspected) exposure to other viral communicable diseases: Secondary | ICD-10-CM | POA: Insufficient documentation

## 2018-11-20 DIAGNOSIS — G300 Alzheimer's disease with early onset: Secondary | ICD-10-CM | POA: Diagnosis not present

## 2018-11-20 DIAGNOSIS — R569 Unspecified convulsions: Secondary | ICD-10-CM

## 2018-11-20 DIAGNOSIS — M4854XA Collapsed vertebra, not elsewhere classified, thoracic region, initial encounter for fracture: Secondary | ICD-10-CM | POA: Insufficient documentation

## 2018-11-20 DIAGNOSIS — A419 Sepsis, unspecified organism: Secondary | ICD-10-CM

## 2018-11-20 DIAGNOSIS — I10 Essential (primary) hypertension: Secondary | ICD-10-CM | POA: Insufficient documentation

## 2018-11-20 DIAGNOSIS — F0390 Unspecified dementia without behavioral disturbance: Secondary | ICD-10-CM | POA: Diagnosis present

## 2018-11-20 DIAGNOSIS — Z79899 Other long term (current) drug therapy: Secondary | ICD-10-CM | POA: Insufficient documentation

## 2018-11-20 DIAGNOSIS — E785 Hyperlipidemia, unspecified: Secondary | ICD-10-CM | POA: Insufficient documentation

## 2018-11-20 DIAGNOSIS — R7989 Other specified abnormal findings of blood chemistry: Secondary | ICD-10-CM | POA: Diagnosis not present

## 2018-11-20 DIAGNOSIS — Z0184 Encounter for antibody response examination: Secondary | ICD-10-CM | POA: Insufficient documentation

## 2018-11-20 LAB — PROCALCITONIN: Procalcitonin: 0.37 ng/mL

## 2018-11-20 LAB — URINALYSIS, ROUTINE W REFLEX MICROSCOPIC
Bilirubin Urine: NEGATIVE
Glucose, UA: NEGATIVE mg/dL
Ketones, ur: 5 mg/dL — AB
Nitrite: POSITIVE — AB
Protein, ur: 100 mg/dL — AB
Specific Gravity, Urine: 1.027 (ref 1.005–1.030)
WBC, UA: >50 WBC/hpf — ABNORMAL HIGH (ref 0–5)
pH: 5 (ref 5.0–8.0)

## 2018-11-20 LAB — COMPREHENSIVE METABOLIC PANEL
ALT: 21 U/L (ref 0–44)
AST: 23 U/L (ref 15–41)
Albumin: 3.3 g/dL — ABNORMAL LOW (ref 3.5–5.0)
Alkaline Phosphatase: 52 U/L (ref 38–126)
Anion gap: 9 (ref 5–15)
BUN: 13 mg/dL (ref 8–23)
CO2: 25 mmol/L (ref 22–32)
Calcium: 8.9 mg/dL (ref 8.9–10.3)
Chloride: 104 mmol/L (ref 98–111)
Creatinine, Ser: 0.95 mg/dL (ref 0.61–1.24)
GFR calc Af Amer: >60 mL/min (ref >60)
GFR calc non Af Amer: >60 mL/min (ref >60)
Glucose, Bld: 125 mg/dL — ABNORMAL HIGH (ref 70–99)
Potassium: 3.7 mmol/L (ref 3.5–5.1)
Sodium: 138 mmol/L (ref 135–145)
Total Bilirubin: 2.3 mg/dL — ABNORMAL HIGH (ref 0.3–1.2)
Total Protein: 6.5 g/dL (ref 6.5–8.1)

## 2018-11-20 LAB — CBC
HCT: 38.5 % — ABNORMAL LOW (ref 39.0–52.0)
Hemoglobin: 13.2 g/dL (ref 13.0–17.0)
MCH: 31.8 pg (ref 26.0–34.0)
MCHC: 34.3 g/dL (ref 30.0–36.0)
MCV: 92.8 fL (ref 80.0–100.0)
Platelets: 135 10*3/uL — ABNORMAL LOW (ref 150–400)
RBC: 4.15 MIL/uL — ABNORMAL LOW (ref 4.22–5.81)
RDW: 12.8 % (ref 11.5–15.5)
WBC: 14.2 10*3/uL — ABNORMAL HIGH (ref 4.0–10.5)
nRBC: 0 % (ref 0.0–0.2)

## 2018-11-20 LAB — LACTIC ACID, PLASMA
Lactic Acid, Venous: 1.2 mmol/L (ref 0.5–1.9)
Lactic Acid, Venous: 1 mmol/L (ref 0.5–1.9)

## 2018-11-20 LAB — SARS CORONAVIRUS 2 BY RT PCR (HOSPITAL ORDER, PERFORMED IN ~~LOC~~ HOSPITAL LAB): SARS Coronavirus 2: NEGATIVE

## 2018-11-20 LAB — TROPONIN I (HIGH SENSITIVITY): Troponin I (High Sensitivity): 5 ng/L (ref <18)

## 2018-11-20 LAB — D-DIMER, QUANTITATIVE: D-Dimer, Quant: 0.79 ug/mL-FEU — ABNORMAL HIGH (ref 0.00–0.50)

## 2018-11-20 LAB — VALPROIC ACID LEVEL: Valproic Acid Lvl: 23 ug/mL — ABNORMAL LOW (ref 50.0–100.0)

## 2018-11-20 MED ORDER — DIVALPROEX SODIUM 125 MG PO CSDR
250.0000 mg | DELAYED_RELEASE_CAPSULE | Freq: Two times a day (BID) | ORAL | Status: DC
  Administered 2018-11-20 – 2018-11-21 (×2): 250 mg via ORAL
  Filled 2018-11-20 (×3): qty 2

## 2018-11-20 MED ORDER — ACETAMINOPHEN 500 MG PO TABS
1000.0000 mg | ORAL_TABLET | Freq: Once | ORAL | Status: AC
  Administered 2018-11-20: 1000 mg via ORAL
  Filled 2018-11-20: qty 2

## 2018-11-20 MED ORDER — SODIUM CHLORIDE 0.9 % IV SOLN
INTRAVENOUS | Status: AC
  Administered 2018-11-20: 23:00:00 via INTRAVENOUS

## 2018-11-20 MED ORDER — ACETAMINOPHEN 325 MG PO TABS: 650.0000 mg | ORAL_TABLET | Freq: Four times a day (QID) | ORAL | Status: DC | PRN

## 2018-11-20 MED ORDER — VANCOMYCIN HCL IN DEXTROSE 1-5 GM/200ML-% IV SOLN
1000.0000 mg | Freq: Once | INTRAVENOUS | Status: AC
  Administered 2018-11-20: 17:00:00 1000 mg via INTRAVENOUS
  Filled 2018-11-20: qty 200

## 2018-11-20 MED ORDER — SODIUM CHLORIDE 0.9 % IV SOLN
1.0000 g | INTRAVENOUS | Status: DC
  Administered 2018-11-21 – 2018-11-22 (×2): 1 g via INTRAVENOUS
  Filled 2018-11-20: qty 1
  Filled 2018-11-20: qty 10
  Filled 2018-11-20: qty 1

## 2018-11-20 MED ORDER — SODIUM CHLORIDE 0.9 % IV BOLUS
1000.0000 mL | Freq: Once | INTRAVENOUS | Status: AC
  Administered 2018-11-20: 1000 mL via INTRAVENOUS

## 2018-11-20 MED ORDER — HYDROCODONE-ACETAMINOPHEN 5-325 MG PO TABS
1.0000 | ORAL_TABLET | ORAL | Status: DC | PRN
  Administered 2018-11-20: 23:00:00 1 via ORAL
  Filled 2018-11-20: qty 1

## 2018-11-20 MED ORDER — ONDANSETRON HCL 4 MG/2ML IJ SOLN: 4.0000 mg | Freq: Four times a day (QID) | INTRAMUSCULAR | Status: DC | PRN

## 2018-11-20 MED ORDER — TRAZODONE HCL 50 MG PO TABS
200.0000 mg | ORAL_TABLET | Freq: Every day | ORAL | Status: DC
  Administered 2018-11-20: 200 mg via ORAL
  Filled 2018-11-20: qty 4

## 2018-11-20 MED ORDER — ONDANSETRON HCL 4 MG PO TABS: 4.0000 mg | ORAL_TABLET | Freq: Four times a day (QID) | ORAL | Status: DC | PRN

## 2018-11-20 MED ORDER — ALPRAZOLAM 1 MG PO TABS
1.0000 mg | ORAL_TABLET | Freq: Every day | ORAL | Status: DC
  Administered 2018-11-20: 1 mg via ORAL
  Filled 2018-11-20: qty 1

## 2018-11-20 MED ORDER — ALPRAZOLAM 0.5 MG PO TABS
0.5000 mg | ORAL_TABLET | Freq: Two times a day (BID) | ORAL | Status: DC
  Filled 2018-11-20: qty 1

## 2018-11-20 MED ORDER — ALPRAZOLAM 0.5 MG PO TABS: 0.5000 mg | ORAL_TABLET | Freq: Two times a day (BID) | ORAL | Status: DC

## 2018-11-20 MED ORDER — ACETAMINOPHEN 650 MG RE SUPP: 650.0000 mg | Freq: Four times a day (QID) | RECTAL | Status: DC | PRN

## 2018-11-20 MED ORDER — SODIUM CHLORIDE 0.9 % IV SOLN
2.0000 g | Freq: Once | INTRAVENOUS | Status: AC
  Administered 2018-11-20: 2 g via INTRAVENOUS
  Filled 2018-11-20: qty 2

## 2018-11-20 MED ORDER — SODIUM CHLORIDE 0.9 % IV BOLUS
1000.0000 mL | Freq: Once | INTRAVENOUS | Status: AC
  Administered 2018-11-20: 17:00:00 1000 mL via INTRAVENOUS

## 2018-11-20 MED ORDER — ENOXAPARIN SODIUM 40 MG/0.4ML ~~LOC~~ SOLN
40.0000 mg | Freq: Every day | SUBCUTANEOUS | Status: DC
  Administered 2018-11-20 – 2018-11-22 (×3): 40 mg via SUBCUTANEOUS
  Filled 2018-11-20 (×3): qty 0.4

## 2018-11-20 NOTE — ED Notes (Signed)
ED TO INPATIENT HANDOFF REPORT  Name/Age/Gender Kenneth Mcdaniel 63 y.o. male  Code Status    Code Status Orders  (From admission, onward)         Start     Ordered   11/20/18 2203  Do not attempt resuscitation (DNR)  Continuous    Question Answer Comment  In the event of cardiac or respiratory ARREST Do not call a "code blue"   In the event of cardiac or respiratory ARREST Do not perform Intubation, CPR, defibrillation or ACLS   In the event of cardiac or respiratory ARREST Use medication by any route, position, wound care, and other measures to relive pain and suffering. May use oxygen, suction and manual treatment of airway obstruction as needed for comfort.      11/20/18 2202        Code Status History    Date Active Date Inactive Code Status Order ID Comments User Context   03/20/2017 2039 03/22/2017 1614 Full Code [id number]  Elpidio AnisUpstill, Shari, PA-C ED   Advance Care Planning Activity      Home/SNF/Other Nursing Home  Chief Complaint Weakness  Level of Care/Admitting Diagnosis ED Disposition    ED Disposition Condition Comment   Admit  [facility] [[id number]]  Level of Care: Stepdown [14]  Admit to SDU based on following criteria: Hemodynamic compromise or significant risk of instability:  Patient requiring short term acute titration and management of vasoactive drips, and invasive monitoring (i.e., CVP and Arterial line).  Covid Evaluation: Asymptomatic Screening Protocol (No Symptoms)  Diagnosis: UTI (urinary tract infection) [151761][218863]  Admitting Physician: Therisa DoyneUTOVA, ANASTASSIA [[id number]]  Attending Physician: Therisa DoyneUTOVA, ANASTASSIA [[id number]]  PT Class (Do Not Modify): Observation [104]  PT Acc Code (Do Not Modify): Observation [10022]       Medical History Past Medical History:  Diagnosis Date  . Early onset Alzheimer's dementia (HCC)    [facility]  . Hyperlipidemia     Allergies No Known Allergies  IV  Location/Drains/Wounds Patient Lines/Drains/Airways Status   Active Line/Drains/Airways    Name:   Placement date:   Placement time:   Site:   Days:   Peripheral IV 11/20/18 Left Forearm   11/20/18    1530    Forearm   less than 1   Peripheral IV 11/20/18 Right Antecubital   11/20/18    1531    Antecubital   less than 1          Labs/Imaging Results for orders placed or performed during the hospital encounter of 11/20/18 (from the past 48 hour(s))  CBC     Status: Abnormal   Collection Time: 11/20/18  3:28 PM  Result Value Ref Range   WBC 14.2 (H) 4.0 - 10.5 K/uL   RBC 4.15 (L) 4.22 - 5.81 MIL/uL   Hemoglobin 13.2 13.0 - 17.0 g/dL   HCT 60.738.5 (L) 37.139.0 - 06.252.0 %   MCV 92.8 80.0 - 100.0 fL   MCH 31.8 26.0 - 34.0 pg   MCHC 34.3 30.0 - 36.0 g/dL   RDW 69.412.8 85.411.5 - 62.715.5 %   Platelets 135 (L) 150 - 400 K/uL   nRBC 0.0 0.0 - 0.2 %    Comment: Performed at [facility], [address] [place], [place] [address]  Comprehensive metabolic panel     Status: Abnormal   Collection Time: 11/20/18  3:28 PM  Result Value Ref Range   Sodium 138 135 - 145 mmol/L   Potassium  3.7 3.5 - 5.1 mmol/L   Chloride 104 98 - 111 mmol/L   CO2 25 22 - 32 mmol/L   Glucose, Bld 125 (H) 70 - 99 mg/dL   BUN 13 8 - 23 mg/dL   Creatinine, Ser 1.61 0.61 - 1.24 mg/dL   Calcium 8.9 8.9 - 09.6 mg/dL   Total Protein 6.5 6.5 - 8.1 g/dL   Albumin 3.3 (L) 3.5 - 5.0 g/dL   AST 23 15 - 41 U/L   ALT 21 0 - 44 U/L   Alkaline Phosphatase 52 38 - 126 U/L   Total Bilirubin 2.3 (H) 0.3 - 1.2 mg/dL   GFR calc non Af Amer >60 >60 mL/min   GFR calc Af Amer >60 >60 mL/min   Anion gap 9 5 - 15    Comment: Performed at [facility], [address], [place], [place] [address]  Lactic acid, plasma     Status: None   Collection Time: 11/20/18  3:28 PM  Result Value Ref Range   Lactic Acid, Venous 1.2 0.5 - 1.9 mmol/L    Comment: Performed at [facility], [address] [place], [place] [address]  Procalcitonin     Status: None   Collection Time: 11/20/18  3:28 PM  Result Value Ref Range   Procalcitonin 0.37 ng/mL    Comment:        Interpretation: PCT (Procalcitonin) <= 0.5 ng/mL: Systemic infection (sepsis) is not likely. Local bacterial infection is possible. (NOTE)       Sepsis PCT Algorithm           Lower Respiratory Tract                                      Infection PCT Algorithm    ----------------------------     ----------------------------         PCT < 0.25 ng/mL                PCT < 0.10 ng/mL         Strongly encourage             Strongly discourage   discontinuation of antibiotics    initiation of antibiotics    ----------------------------     -----------------------------       PCT 0.25 - 0.50 ng/mL            PCT 0.10 - 0.25 ng/mL               OR       >80% decrease in PCT            Discourage initiation of                                            antibiotics      Encourage discontinuation           of antibiotics    ----------------------------     -----------------------------         PCT >= 0.50 ng/mL              PCT 0.26 - 0.50 ng/mL               AND        <80% decrease in PCT  Encourage initiation of                                             antibiotics       Encourage continuation           of antibiotics    ----------------------------     -----------------------------        PCT >= 0.50 ng/mL                  PCT > 0.50 ng/mL               AND         increase in PCT                  Strongly encourage                                      initiation of antibiotics    Strongly encourage escalation           of antibiotics                                     -----------------------------                                           PCT <= 0.25 ng/mL                                                 OR                                        > 80% decrease in PCT                                      Discontinue / Do not initiate                                             antibiotics Performed at [place] [address], [place], [place] [address]   Urinalysis, Routine w reflex microscopic     Status: Abnormal   Collection Time: 11/20/18  5:46 PM  Result Value Ref Range   Color, Urine AMBER (A) YELLOW    Comment: BIOCHEMICALS MAY BE AFFECTED BY COLOR   APPearance CLOUDY (A) CLEAR   Specific Gravity, Urine 1.027 1.005 - 1.030   pH 5.0 5.0 - 8.0   Glucose, UA NEGATIVE NEGATIVE mg/dL   Hgb urine dipstick MODERATE (A) NEGATIVE   Bilirubin Urine NEGATIVE NEGATIVE   Ketones, ur 5 (A) NEGATIVE mg/dL   Protein, ur 100 (A) NEGATIVE mg/dL   Nitrite POSITIVE (A) NEGATIVE   Leukocytes,Ua LARGE (  A) NEGATIVE   RBC / HPF 11-20 0 - 5 RBC/hpf   WBC, UA >50 (H) 0 - 5 WBC/hpf   Bacteria, UA MANY (A) NONE SEEN   Squamous Epithelial / LPF 0-5 0 - 5   WBC Clumps PRESENT    Mucus PRESENT    Non Squamous Epithelial 11-20 (A) NONE SEEN    Comment: Performed at [facility], [address] [place], [place] [address]  Valproic acid level     Status: Abnormal   Collection Time: 11/20/18  6:39 PM  Result Value Ref Range   Valproic Acid Lvl 23 (L) 50.0 - 100.0 ug/mL    Comment: Performed at [facility], [address] [place], [place] [address]  SARS Coronavirus 2 [facility](Hospital order, Performed in [facility] lab)     Status: None   Collection Time: 11/20/18  7:00 PM  Result Value Ref Range   SARS Coronavirus 2 NEGATIVE NEGATIVE    Comment: (NOTE) If result is NEGATIVE SARS-CoV-2 target nucleic acids are NOT DETECTED. The SARS-CoV-2 RNA is generally detectable in upper and lower  respiratory specimens during the acute phase of infection. The lowest  concentration of SARS-CoV-2 viral copies this assay can detect is 250  copies / mL. A negative result does not preclude SARS-CoV-2 infection  and should not be used as the sole basis  for treatment or other  patient management decisions.  A negative result may occur with  improper specimen collection / handling, submission of specimen other  than nasopharyngeal swab, presence of viral mutation(s) within the  areas targeted by this assay, and inadequate number of viral copies  (<250 copies / mL). A negative result must be combined with clinical  observations, patient history, and epidemiological information. If result is POSITIVE SARS-CoV-2 target nucleic acids are DETECTED. The SARS-CoV-2 RNA is generally detectable in upper and lower  respiratory specimens dur ing the acute phase of infection.  Positive  results are indicative of active infection with SARS-CoV-2.  Clinical  correlation with patient history and other diagnostic information is  necessary to determine patient infection status.  Positive results do  not rule out bacterial infection or co-infection with other viruses. If result is PRESUMPTIVE POSTIVE SARS-CoV-2 nucleic acids MAY BE PRESENT.   A presumptive positive result was obtained on the submitted specimen  and confirmed on repeat testing.  While 2019 novel coronavirus  (SARS-CoV-2) nucleic acids may be present in the submitted sample  additional confirmatory testing may be necessary for epidemiological  and / or clinical management purposes  to differentiate between  SARS-CoV-2 and other Sarbecovirus currently known to infect humans.  If clinically indicated additional testing with an alternate test  methodology [phone]([id number]) is advised. The SARS-CoV-2 RNA is generally  detectable in upper and lower respiratory sp ecimens during the acute  phase of infection. The expected result is Negative. Fact Sheet for Patients:  BoilerBrush.com.cyhttps://www.fda.gov/media/136312/download Fact Sheet for Healthcare Providers: https://pope.com/https://www.fda.gov/media/136313/download This test is not yet approved or cleared by the [place] FDA and has been authorized for detection and/or  diagnosis of SARS-CoV-2 by FDA under an Emergency Use Authorization (EUA).  This EUA will remain in effect (meaning this test can be used) for the duration of the COVID-19 declaration under Section 564(b)(1) of the Act, 21 U.S.C. section 360bbb-3(b)(1), unless the authorization is terminated or revoked sooner. Performed at [facility], [address] [place], [place] [address]    [address]  Result Date: 11/20/2018 CLINICAL DATA:  Weakness brought in by EMS EXAM: PORTABLE CHEST 1 VIEW COMPARISON:  Radiograph 10/02/2018 FINDINGS: Findings suggesting mild interstitial edema with peripheral septal lines and hazy interstitial opacity. No focal consolidation. No pneumothorax or effusion. Cardiomediastinal contours are stable from prior studies. Degenerative changes are present in the imaged shoulders and spine. IMPRESSION: Mild interstitial edema. Electronically Signed   By: Kreg ShropshirePrice  DeHay M.D.   On: 11/20/2018 16:16    Pending Labs Unresulted Labs (From admission, onward)    Start     Ordered   11/21/18 0500  HIV antibody (Routine Testing)  Tomorrow morning,   R     11/20/18 2202   11/21/18 0500  Magnesium  Tomorrow morning,   R    Comments: Call MD if <1.5    11/20/18 2202   11/21/18 0500  Phosphorus  Tomorrow morning,   R     11/20/18 2202   11/21/18 0500  TSH  Once,   STAT    Comments: Cancel if already done within 1 month and notify MD    11/20/18 2202   11/21/18 0500  Comprehensive metabolic panel  Once,   STAT    Comments: Cal MD for K<3.5 or >5.0    11/20/18 2202   11/21/18 0500  CBC  Once,   STAT    Comments: Call for hg <8.0    11/20/18 2202   11/20/18 2300  Lactic acid, plasma  STAT Now then every 3 hours,   STAT     11/20/18 1920   11/20/18 2107  D-dimer, quantitative (not at [facility])  Add-on,   AD     11/20/18 2106   11/20/18 1504  Blood Culture (routine x 2)  BLOOD CULTURE X 2,   STAT     11/20/18 1504   11/20/18 1504  Urine culture  ONCE  - STAT,   STAT     11/20/18 1504          Vitals/Pain Today's Vitals   11/20/18 2030 11/20/18 2100 11/20/18 2130 11/20/18 2204  BP: (!) 81/57 (!) 92/55 (!) 84/55 (!) 84/63  Pulse: 63 67 65 65  Resp: 13 14 14 20   Temp:      TempSrc:      SpO2: 100% 100% 100% 98%    Isolation Precautions No active isolations  Medications Medications  cefTRIAXone (ROCEPHIN) 1 g in sodium chloride 0.9 % 100 mL IVPB (has no administration in time range)  ALPRAZolam (XANAX) tablet 0.5 mg (has no administration in time range)  ALPRAZolam (XANAX) tablet 1 mg (has no administration in time range)  traZODone (DESYREL) tablet 200 mg (has no administration in time range)  divalproex (DEPAKOTE SPRINKLE) capsule 250 mg (has no administration in time range)  acetaminophen (TYLENOL) tablet 650 mg (has no administration in time range)    Or  acetaminophen (TYLENOL) suppository 650 mg (has no administration in time range)  HYDROcodone-acetaminophen (NORCO/VICODIN) 5-325 MG per tablet 1-2 tablet (has no administration in time range)  ondansetron (ZOFRAN) tablet 4 mg (has no administration in time range)    Or  ondansetron (ZOFRAN) injection 4 mg (has no administration in time range)  enoxaparin (LOVENOX) injection 40 mg (has no administration in time range)  0.9 %  sodium chloride infusion (has no administration in time range)  ceFEPIme (MAXIPIME) 2 g in sodium chloride 0.9 % 100 mL IVPB (0 g Intravenous Stopped 11/20/18 1721)  vancomycin (VANCOCIN) IVPB 1000 mg/200 mL premix (0 mg Intravenous Stopped 11/20/18  1753)  sodium chloride 0.9 % bolus 1,000 mL (0 mLs Intravenous Stopped 11/20/18 1753)  acetaminophen (TYLENOL) tablet 1,000 mg (1,000 mg Oral Given 11/20/18 1651)  sodium chloride 0.9 % bolus 1,000 mL (0 mLs Intravenous Stopped 11/20/18 2135)  sodium chloride 0.9 % bolus 1,000 mL (0 mLs Intravenous Stopped 11/20/18 2203)    Mobility walks with device

## 2018-11-20 NOTE — ED Triage Notes (Signed)
Pt BIB EMS from [facility]. Facility reports loss of appetite and on-site physician wanted pt to be checked for UTI. Pt has Alzhemier's.   BP 176/84 HR 110 99% RA CBG 120

## 2018-11-20 NOTE — H&P (Signed)
Kenneth Mcdaniel DPO:[id number] DOB: 09/28/1955 DOA: 11/20/2018     PCP: Derrill Center., MD   Outpatient Specialists:     NEurology   Dr. Verdene Rio, Murvin Donning., MD now retired    Patient arrived to ER on 11/20/18 at 1447  Patient coming from:  From facility [place]    Chief Complaint:  Chief Complaint  Patient presents with  . Weakness    HPI: Kenneth Mcdaniel is a 63 y.o. male with medical history significant of Dementia, HTN, Seizure    Presented with   Decreased PO intake foul smelling urine Unable To contribute to his history secondary to severe dementia   Infectious risk factors:  Reports none In  ER RAPID COVID TEST NEGATIVE    Regarding pertinent Chronic problems:   Dementia severe  HLD not on any medications   One episode of seizure in April on Depakote  While in ER: Febrile 103 BP in the 80's UA possible UTI likely sepsis due to UTI Lactic acid 1.2 BPnow 96/50 Getting 2nd litter of bolus And started on broad-spectrum antibiotics including cefepime and vancomycin  The following Work up has been ordered so far:  Orders Placed This Encounter  Procedures  . Blood Culture (routine x 2)  . Urine culture  . SARS Coronavirus 2 [facility] order, Performed in [facility] hospital lab)  . DG Chest Port 1 View  . CBC  . Comprehensive metabolic panel  . Urinalysis, Routine w reflex microscopic  . Valproic acid level  . Lactic acid, plasma  . Procalcitonin  . D-dimer, quantitative (not at [facility])  . Diet NPO time specified  . Cardiac monitoring  . Refer to Sidebar Report: Sepsis Bundle ED/IP  . Document vital signs within 1-hour of fluid bolus completion and notify provider of bolus completion  . Document Actual / Estimated Weight  . Initiate Carrier Fluid Protocol  . In and Out Cath  . If lactate (lactic acid) >2, verify repeat lactic acid order has been placed to be drawn  . Document vital signs within 1-hour of fluid  bolus completion and notify provider of bolus completion  . Vital signs  . Refer to Sidebar Report: Sepsis Sidebar  . Cardiac Monitoring Continuous x 24 hours Indications for use: Other; other indications for use: sepsis  . Cardiac monitoring  . Initiate Code Sepsis ([facility] [phone]) Reason for Consult? tracking  . Consult to social work  . Consult to hospitalist  ALL PATIENTS BEING ADMITTED/HAVING PROCEDURES NEED COVID-19 SCREENING  . Pulse oximetry, continuous  . ED EKG 12-Lead  . EKG 12-Lead  . Place in observation (patient's expected length of stay will be less than 2 midnights)    Following Medications were ordered in ER: Medications  cefTRIAXone (ROCEPHIN) 1 g in sodium chloride 0.9 % 100 mL IVPB (has no administration in time range)  ceFEPIme (MAXIPIME) 2 g in sodium chloride 0.9 % 100 mL IVPB (0 g Intravenous Stopped 11/20/18 1721)  vancomycin (VANCOCIN) IVPB 1000 mg/200 mL premix (0 mg Intravenous Stopped 11/20/18 1753)  sodium chloride 0.9 % bolus 1,000 mL (0 mLs Intravenous Stopped 11/20/18 1753)  acetaminophen (TYLENOL) tablet 1,000 mg (1,000 mg Oral Given 11/20/18 1651)  sodium chloride 0.9 % bolus 1,000 mL (1,000 mLs Intravenous New Bag/Given (Non-Interop) 11/20/18 1914)  sodium chloride 0.9 % bolus 1,000 mL (1,000 mLs Intravenous New Bag/Given (Non-Interop) 11/20/18 1938)        Consult Orders  (From admission, onward)  Start     Ordered   11/20/18 1840  Consult to hospitalist  ALL PATIENTS BEING ADMITTED/HAVING PROCEDURES NEED COVID-19 SCREENING  Once    Comments: ALL PATIENTS BEING ADMITTED/HAVING PROCEDURES NEED COVID-19 SCREENING  Provider:  (Not yet assigned)  Question Answer Comment  Place call to: [facility]   Reason for Consult Admit      11/20/18 1839   11/20/18 1659  Consult to social work  Once    Comments: [employer]:  (Not yet assigned)  Question:  Reason for Consult:  Answer:  Assisted living facility placement    11/20/18 1659            Significant initial  Findings: Abnormal Labs Reviewed  CBC - Abnormal; Notable for the following components:      Result Value   WBC 14.2 (*)    RBC 4.15 (*)    HCT 38.5 (*)    Platelets 135 (*)    All other components within normal limits  COMPREHENSIVE METABOLIC PANEL - Abnormal; Notable for the following components:   Glucose, Bld 125 (*)    Albumin 3.3 (*)    Total Bilirubin 2.3 (*)    All other components within normal limits  URINALYSIS, ROUTINE W REFLEX MICROSCOPIC - Abnormal; Notable for the following components:   Color, Urine AMBER (*)    APPearance CLOUDY (*)    Hgb urine dipstick MODERATE (*)    Ketones, ur 5 (*)    Protein, ur 100 (*)    Nitrite POSITIVE (*)    Leukocytes,Ua LARGE (*)    WBC, UA >50 (*)    Bacteria, UA MANY (*)    Non Squamous Epithelial 11-20 (*)    All other components within normal limits  VALPROIC ACID LEVEL - Abnormal; Notable for the following components:   Valproic Acid Lvl 23 (*)    All other components within normal limits      Otherwise labs showing:    Recent Labs  Lab 11/20/18 1528  NA 138  K 3.7  CO2 25  GLUCOSE 125*  BUN 13  CREATININE 0.95  CALCIUM 8.9    Cr    stable,   Lab Results  Component Value Date   CREATININE 0.95 11/20/2018   CREATININE 0.98 10/02/2018   CREATININE 1.06 03/10/2018    Recent Labs  Lab 11/20/18 1528  AST 23  ALT 21  ALKPHOS 52  BILITOT 2.3*  PROT 6.5  ALBUMIN 3.3*   Lab Results  Component Value Date   CALCIUM 8.9 11/20/2018       WBC      Component Value Date/Time   WBC 14.2 (H) 11/20/2018 1528   ANC    Component Value Date/Time   NEUTROABS 7.0 10/02/2018 2251     Plt: Lab Results  Component Value Date   PLT 135 (L) 11/20/2018     Lactic Acid, Venous    Component Value Date/Time   LATICACIDVEN 1.2 11/20/2018 1528    Procalcitonin 0.37   COVID-19 Labs  No results for input(s): DDIMER, FERRITIN, LDH, CRP in the last 72  hours.  Lab Results  Component Value Date   SARSCOV2NAA NEGATIVE 11/20/2018     HG/HCT   Stable,     Component Value Date/Time   HGB 13.2 11/20/2018 1528   HCT 38.5 (L) 11/20/2018 1528      Troponin ordered      UA  evidence of UTI      Urine  analysis:    Component Value Date/Time   COLORURINE AMBER (A) 11/20/2018 1746   APPEARANCEUR CLOUDY (A) 11/20/2018 1746   LABSPEC 1.027 11/20/2018 1746   PHURINE 5.0 11/20/2018 1746   GLUCOSEU NEGATIVE 11/20/2018 1746   HGBUR MODERATE (A) 11/20/2018 1746   BILIRUBINUR NEGATIVE 11/20/2018 1746   KETONESUR 5 (A) 11/20/2018 1746   PROTEINUR 100 (A) 11/20/2018 1746   NITRITE POSITIVE (A) 11/20/2018 1746   LEUKOCYTESUR LARGE (A) 11/20/2018 1746        CXR - mild interstitial edema    ECG:  Personally reviewed by me showing: HR : 111 Rhythm:  Sinus tachycardia   nonspecific changes,  QTC 426      ED Triage Vitals [11/20/18 1502]  Enc Vitals Group     BP 90/60     Pulse Rate (!) 113     Resp 18     Temp 98.3 F (36.8 C)     Temp Source Oral     SpO2 96 %     Weight      Height      Head Circumference      Peak Flow      Pain Score      Pain Loc      Pain Edu?      Excl. in [place]?   UXNA(35)@       Latest  Blood pressure 96/79, pulse 84, temperature 99.9 F (37.7 C), temperature source Rectal, resp. rate 20, SpO2 99 %.     Hospitalist was called for admission for sepsis severe with hypotension in the setting of UTI   Review of Systems:    Pertinent positives include: Decreased p.o. intake otherwise patient unable to provide detailed history    Past Medical History:   Past Medical History:  Diagnosis Date  . Early onset Alzheimer's dementia ([place])    from [facility]  . Hyperlipidemia       History reviewed. No pertinent surgical history.  Social History:  Ambulatory   independently       reports that he does not use drugs. No history on file for tobacco and alcohol.   Family History:    Family History  Problem Relation Age of Onset  . Stroke Mother   . Diabetes Neg Hx   . Hypertension Neg Hx     Allergies: No Known Allergies   Prior to Admission medications   Medication Sig Start Date End Date Taking? Authorizing Provider  ALPRAZolam [name]) 0.5 MG tablet Take 1 tablet (0.5 mg total) by mouth 2 (two) times daily as needed for anxiety. Patient taking differently: Take 0.5 mg by mouth 2 (two) times daily.  03/22/17  Yes Ethelene Hal, NP  ALPRAZolam [name]) 1 MG tablet Take 1 mg by mouth at bedtime.    Yes [provider]  divalproex (DEPAKOTE SPRINKLE) 125 MG capsule Take 250 mg by mouth 2 (two) times daily.   Yes [provider]  feeding supplement, GLUCERNA SHAKE, (GLUCERNA SHAKE) LIQD Take 237 mLs by mouth See admin instructions. Drink 1 shake (237 ml's) by mouth three times a day and with any snacks (CHOCOLATE)   Yes [provider]  traZODone (DESYREL) 100 MG tablet Take 200 mg by mouth at bedtime.  08/16/18  Yes [provider]  ketorolac (ACULAR) 0.5 % ophthalmic solution Place 1 drop into the left eye every 6 (six) hours. Patient not taking: Reported on 10/02/2018 04/26/17   McDonald, Mia A, PA-C  valproic acid (  DEPAKENE) 250 MG/5ML SOLN solution Take 7 mLs (350 mg total) by mouth 3 (three) times daily. Patient not taking: Reported on 11/20/2018 10/03/18   Antonietta Breach, PA-C   Physical Exam: Blood pressure 96/79, pulse 84, temperature 99.9 F (37.7 C), temperature source Rectal, resp. rate 20, SpO2 99 %. 1. General:  in No Acute distress   well  -appearing 2. Psychological: Alert but not  Oriented 3. Head/ENT:    Dry Mucous Membranes                          Head Non traumatic, neck supple                           Poor Dentition 4. SKIN:  decreased Skin turgor,  Skin clean Dry and intact no rash 5. Heart: Regular rate and rhythm no  Murmur, no Rub or gallop 6. Lungs:  no wheezes or crackles   7. Abdomen: Soft,   non-tender, Non distended  bowel sounds present 8. Lower extremities: no clubbing, cyanosis, no  edema 9. Neurologically Grossly intact, moving all 4 extremities equally  10. MSK: Normal range of motion   All other LABS:     Recent Labs  Lab 11/20/18 1528  WBC 14.2*  HGB 13.2  HCT 38.5*  MCV 92.8  PLT 135*     Recent Labs  Lab 11/20/18 1528  NA 138  K 3.7  CL 104  CO2 25  GLUCOSE 125*  BUN 13  CREATININE 0.95  CALCIUM 8.9     Recent Labs  Lab 11/20/18 1528  AST 23  ALT 21  ALKPHOS 52  BILITOT 2.3*  PROT 6.5  ALBUMIN 3.3*       Cultures: No results found for: SDES, SPECREQUEST, CULT, REPTSTATUS   Radiological Exams on Admission: Dg Chest Port 1 View  Result Date: 11/20/2018 CLINICAL DATA:  Weakness brought in by EMS EXAM: PORTABLE CHEST 1 VIEW COMPARISON:  Radiograph 10/02/2018 FINDINGS: Findings suggesting mild interstitial edema with peripheral septal lines and hazy interstitial opacity. No focal consolidation. No pneumothorax or effusion. Cardiomediastinal contours are stable from prior studies. Degenerative changes are present in the imaged shoulders and spine. IMPRESSION: Mild interstitial edema. Electronically Signed   By: Lovena Le M.D.   On: 11/20/2018 16:16    Chart has been reviewed    Assessment/Plan   63 y.o. male with medical history significant of Dementia, HTN, Seizure    Admitted for severe Sepsis  Present on Admission: . UTI (urinary tract infection) -  - treat with Rocephin        await results of urine culture and adjust antibiotic coverage as needed  . Dementia ([place]) -chronic severe watch out for any signs of sundowning continue home medications  History of seizure in April continue Depakote currently no evidence of seizure activity  . severe Sepsis ([place]) -  -SIRS criteria met with  elevated white blood cell count,  tachycardia ,   fever.    With evidence of end organ damage such as encephalopathy hypotension -Most likely  source being: urinary,    - Obtain serial lactic acid and procalcitonin level.  - Initiate IV antibiotics   - await results of blood and urine culture  - Rehydrate aggressively  Abnormal chest x-ray showing mild interstitial edema patient not on oxygen no evidence of hypoxia setting 100% we will repeat chest x-ray tomorrow currently does not appear to  be fluid overload per hour fluid depleted with hypotension Nonetheless we will be judicious with using of IV fluids blood pressure allows   Other plan as per orders.  DVT prophylaxis:    Lovenox     Code Status:    DNR/DNI  as per  family to avoid aggressive interventions such as pressors okay with IV antibiotics and IV fluids I had personally discussed CODE STATUS with patient    Family Communication:   Family not at  Bedside  plan of care was discussed on the phone with  Wife,    Disposition Plan:                            Back to current facility when stable                                                                Social Work  consulted                                       Consults called: none  Admission status:  ED Disposition    ED Disposition Condition [place]: [facility] [100102]  Level of Care: Stepdown [14]  Admit to SDU based on following criteria: Hemodynamic compromise or significant risk of instability:  Patient requiring short term acute titration and management of vasoactive drips, and invasive monitoring (i.e., CVP and Arterial line).  Covid Evaluation: Asymptomatic Screening Protocol (No Symptoms)  Diagnosis: UTI (urinary tract infection) [818403]  Admitting Physician: Toy Baker [[id number]]  Attending Physician: Toy Baker [[id number]]  PT Class (Do Not Modify): Observation [104]  PT Acc Code (Do Not Modify): Observation [10022]       Obs    Level of care     SDU tele indefinitely please discontinue once patient no longer qualifies  Precautions:   NONE  No active isolations  PPE: Used by the provider:   P100  eye Goggles,  Gloves    Corine Solorio 11/20/2018, 9:11 PM    [facility]     after 2 AM please page floor coverage PA If 7AM-7PM, please contact the day team taking care of the patient using [email or web address]

## 2018-11-20 NOTE — Progress Notes (Signed)
A consult was received from an ED physician for vanc/cefepime per pharmacy dosing.  The patient's profile has been reviewed for ht/wt/allergies/indication/available labs.   A one time order has been placed for vanc 1g and cefepime 2g.  Further antibiotics/pharmacy consults should be ordered by admitting physician if indicated.                       Thank you, Kara Mead 11/20/2018  4:11 PM

## 2018-11-20 NOTE — ED Provider Notes (Addendum)
[facility]-EMERGENCY DEPT Provider Note   CSN: [id number] Arrival date & time: 11/20/18  1447    History   Chief Complaint Chief Complaint  Patient presents with   Weakness    HPI Lance SellMichael R Vanwingerden is a 63 y.o. male.     [facility] noted to have poor po intake for the past couple days, and concern for possible uti. Patient has advanced dementia at baseline, v limited historian - level 5 caveat. No report of fever. No trauma/fall.   The history is provided by the patient and the EMS personnel. The history is limited by the condition of the patient.    Past Medical History:  Diagnosis Date   Early onset Alzheimer's dementia ([facility]   Hyperlipidemia     Patient Active Problem List   Diagnosis Date Noted   Dementia (HCC) 03/21/2017    History reviewed. No pertinent surgical history.      Home Medications    Prior to Admission medications   Medication Sig Start Date End Date Taking? Authorizing Provider  ALPRAZolam Prudy Feeler(XANAX) 0.5 MG tablet Take 1 tablet (0.5 mg total) by mouth 2 (two) times daily as needed for anxiety. Patient taking differently: Take 0.5 mg by mouth 2 (two) times daily.  03/22/17   Laveda AbbeParks, Laurie Britton, NP  ALPRAZolam Prudy Feeler(XANAX) 1 MG tablet Take 1 mg by mouth at bedtime.     [provider]  feeding supplement, GLUCERNA SHAKE, (GLUCERNA SHAKE) LIQD Take 237 mLs by mouth See admin instructions. Drink 1 shake (237 ml's) by mouth three times a day and with any snacks (CHOCOLATE)    [provider]  ketorolac (ACULAR) 0.5 % ophthalmic solution Place 1 drop into the left eye every 6 (six) hours. Patient not taking: Reported on 10/02/2018 04/26/17   McDonald, Mia A, PA-C  traZODone (DESYREL) 100 MG tablet Take 200 mg by mouth at bedtime.  08/16/18   [provider]  valproic acid (DEPAKENE) 250 MG/5ML SOLN solution Take 7 mLs (350 mg total) by mouth 3 (three)  times daily. 10/03/18   Antony MaduraHumes, Kelly, PA-C  divalproex (DEPAKOTE SPRINKLE) 125 MG capsule Take 250 mg by mouth 2 (two) times a day. 08/16/18 10/03/18  [provider]    Family History History reviewed. No pertinent family history.  Social History Social History   Tobacco Use   Smoking status: Not on file  Substance Use Topics   Alcohol use: Not on file   Drug use: No     Allergies   Patient has no known allergies.   Review of Systems Review of Systems  Unable to perform ROS: Dementia  Constitutional: Negative for fever.  level 5 caveat - dementia   Physical Exam Updated Vital Signs BP 90/60    Pulse (!) 113    Temp (!) 103 F (39.4 C) (Rectal)    Resp 18    SpO2 96%   Physical Exam Vitals signs and nursing note reviewed.  Constitutional:      Appearance: Normal appearance. He is well-developed.  HENT:     Head: Atraumatic.     Nose: Nose normal.     Mouth/Throat:     Mouth: Mucous membranes are moist.     Pharynx: Oropharynx is clear.  Eyes:     General: No scleral icterus.    Conjunctiva/sclera: Conjunctivae normal.     Pupils: Pupils are equal, round, and reactive to light.  Neck:     Musculoskeletal: Normal range of motion and neck supple. No neck rigidity.     Trachea: No tracheal deviation.     Comments: No stiffness or rigidity.  Cardiovascular:     Rate and Rhythm: Normal rate and regular rhythm.     Pulses: Normal pulses.     Heart sounds: Normal heart sounds. No murmur. No friction rub. No gallop.   Pulmonary:     Effort: Pulmonary effort is normal. No accessory muscle usage or respiratory distress.     Breath sounds: Normal breath sounds.  Abdominal:     General: Bowel sounds are normal. There is no distension.     Palpations: Abdomen is soft.     Tenderness: There is no abdominal tenderness. There is no guarding.  Genitourinary:    Comments: No cva tenderness. Normal external gu exam.  Musculoskeletal:        General: No swelling.    Skin:    General: Skin is warm and dry.     Findings: No rash.  Neurological:     Mental Status: He is alert.     Comments: Alert and content appearing. Moves bil extremities purposefully with good strength.   Psychiatric:        Mood and Affect: Mood normal.      ED Treatments / Results  Labs (all labs ordered are listed, but only abnormal results are displayed) Results for orders placed or performed during the hospital encounter of 11/20/18  CBC  Result Value Ref Range   WBC 14.2 (H) 4.0 - 10.5 K/uL   RBC 4.15 (L) 4.22 - 5.81 MIL/uL   Hemoglobin 13.2 13.0 - 17.0 g/dL   HCT 16.138.5 (L) 09.639.0 - 04.552.0 %   MCV 92.8 80.0 - 100.0 fL   MCH 31.8 26.0 - 34.0 pg   MCHC 34.3 30.0 - 36.0 g/dL   RDW 40.912.8 81.111.5 - 91.415.5 %   Platelets 135 (L) 150 - 400 K/uL   nRBC 0.0 0.0 - 0.2 %  Comprehensive metabolic panel  Result Value Ref Range   Sodium 138 135 - 145 mmol/L   Potassium 3.7 3.5 - 5.1 mmol/L   Chloride 104 98 - 111 mmol/L   CO2 25 22 - 32 mmol/L   Glucose, Bld 125 (H) 70 - 99 mg/dL   BUN 13 8 - 23 mg/dL   Creatinine, Ser 7.820.95 0.61 - 1.24 mg/dL   Calcium 8.9 8.9 - 95.610.3 mg/dL   Total Protein 6.5 6.5 - 8.1 g/dL   Albumin 3.3 (L) 3.5 - 5.0 g/dL   AST 23 15 - 41 U/L   ALT 21 0 - 44 U/L   Alkaline Phosphatase 52 38 - 126 U/L   Total Bilirubin 2.3 (H) 0.3 - 1.2 mg/dL   GFR calc non Af Amer >60 >60 mL/min   GFR calc Af Amer >60 >60 mL/min   Anion gap 9 5 - 15  Urinalysis, Routine w reflex microscopic  Result Value Ref Range   Color, Urine AMBER (A) YELLOW   APPearance CLOUDY (A) CLEAR   Specific Gravity, Urine 1.027 1.005 - 1.030   pH 5.0 5.0 - 8.0   Glucose, UA NEGATIVE NEGATIVE mg/dL   Hgb urine dipstick MODERATE (A) NEGATIVE   Bilirubin Urine NEGATIVE NEGATIVE   Ketones, ur 5 (A) NEGATIVE mg/dL   Protein, ur 213100 (A) NEGATIVE mg/dL   Nitrite POSITIVE (A) NEGATIVE   Leukocytes,Ua LARGE (A) NEGATIVE   RBC / HPF 11-20 0 -  5 RBC/hpf   WBC, UA >50 (H) 0 - 5 WBC/hpf   Bacteria, UA  MANY (A) NONE SEEN   Squamous Epithelial / LPF 0-5 0 - 5   WBC Clumps PRESENT    Mucus PRESENT    Non Squamous Epithelial 11-20 (A) NONE SEEN  Lactic acid, plasma  Result Value Ref Range   Lactic Acid, Venous 1.2 0.5 - 1.9 mmol/L   Dg Chest Port 1 View  Result Date: 11/20/2018 CLINICAL DATA:  Weakness brought in by EMS EXAM: PORTABLE CHEST 1 VIEW COMPARISON:  Radiograph 10/02/2018 FINDINGS: Findings suggesting mild interstitial edema with peripheral septal lines and hazy interstitial opacity. No focal consolidation. No pneumothorax or effusion. Cardiomediastinal contours are stable from prior studies. Degenerative changes are present in the imaged shoulders and spine. IMPRESSION: Mild interstitial edema. Electronically Signed   By: Lovena Le M.D.   On: 11/20/2018 16:16    EKG EKG Interpretation  Date/Time:  Wednesday November 20 2018 15:13:31 EDT Ventricular Rate:  111 PR Interval:    QRS Duration: 94 QT Interval:  313 QTC Calculation: 426 R Axis:   78 Text Interpretation:  Sinus tachycardia Nonspecific T wave abnormality No significant change since last tracing Confirmed by Lajean Saver [phone]) on 11/20/2018 4:06:36 PM   Radiology Dg Chest Port 1 View  Result Date: 11/20/2018 CLINICAL DATA:  Weakness brought in by EMS EXAM: PORTABLE CHEST 1 VIEW COMPARISON:  Radiograph 10/02/2018 FINDINGS: Findings suggesting mild interstitial edema with peripheral septal lines and hazy interstitial opacity. No focal consolidation. No pneumothorax or effusion. Cardiomediastinal contours are stable from prior studies. Degenerative changes are present in the imaged shoulders and spine. IMPRESSION: Mild interstitial edema. Electronically Signed   By: Lovena Le M.D.   On: 11/20/2018 16:16    Procedures Procedures (including critical care time)  Medications Ordered in ED Medications - No data to display   Initial Impression / Assessment and Plan / ED Course  I have reviewed the triage vital  signs and the nursing notes.  Pertinent labs & imaging results that were available during my care of the patient were reviewed by me and considered in my medical decision making (see chart for details).  Iv ns. Labs sent.   Reviewed nursing notes and prior charts for additional history.   Patient febrile and hypotensive on vital signs check. Continuous pulse ox and monitor. o2 [place]. Stat labs and cultures. Iv antibiotics given.  Labs reviewed by me - wbc elev. ua pending.   CXR reviewed by me - prom interstitial changes, no def pna.   Recheck pt, bp low again - additional ns bolus iv.   Recheck pt, bp improved from prior. Pt comfortable appearing.   Ua returns - positive for UTI - urine culture sent. Pt has received iv abx.   bp mildly improved from prior.   Hospitalists consulted for admission.  Recheck - Lactate is normal.  CRITICAL CARE RE: sepsis/hypotension, altered mental status, acute uti Performed by: Mirna Mires Total critical care time: 95 minutes Critical care time was exclusive of separately billable procedures and treating other patients. Critical care was necessary to treat or prevent imminent or life-threatening deterioration. Critical care was time spent personally by me on the following activities: development of treatment plan with patient and/or surrogate as well as nursing, discussions with consultants, evaluation of patient's response to treatment, examination of patient, obtaining history from patient or surrogate, ordering and performing treatments and interventions, ordering and review of laboratory studies, ordering and  review of radiographic studies, pulse oximetry and re-evaluation of patient's condition.  Discussed pt with hospitalist - will admit to stepdown unit.   Final Clinical Impressions(s) / ED Diagnoses   Final diagnoses:  None    ED Discharge Orders    None           Cathren LaineSteinl, Deeann Servidio, MD 11/20/18 1914

## 2018-11-21 DIAGNOSIS — N390 Urinary tract infection, site not specified: Secondary | ICD-10-CM | POA: Diagnosis not present

## 2018-11-21 DIAGNOSIS — R569 Unspecified convulsions: Secondary | ICD-10-CM | POA: Diagnosis not present

## 2018-11-21 DIAGNOSIS — F0391 Unspecified dementia with behavioral disturbance: Secondary | ICD-10-CM | POA: Diagnosis not present

## 2018-11-21 DIAGNOSIS — A419 Sepsis, unspecified organism: Secondary | ICD-10-CM | POA: Diagnosis not present

## 2018-11-21 LAB — MAGNESIUM: Magnesium: 2 mg/dL (ref 1.7–2.4)

## 2018-11-21 LAB — COMPREHENSIVE METABOLIC PANEL
ALT: 16 U/L (ref 0–44)
AST: 19 U/L (ref 15–41)
Albumin: 2.7 g/dL — ABNORMAL LOW (ref 3.5–5.0)
Alkaline Phosphatase: 41 U/L (ref 38–126)
Anion gap: 6 (ref 5–15)
BUN: 11 mg/dL (ref 8–23)
CO2: 20 mmol/L — ABNORMAL LOW (ref 22–32)
Calcium: 7.6 mg/dL — ABNORMAL LOW (ref 8.9–10.3)
Chloride: 112 mmol/L — ABNORMAL HIGH (ref 98–111)
Creatinine, Ser: 0.71 mg/dL (ref 0.61–1.24)
GFR calc Af Amer: >60 mL/min (ref >60)
GFR calc non Af Amer: >60 mL/min (ref >60)
Glucose, Bld: 105 mg/dL — ABNORMAL HIGH (ref 70–99)
Potassium: 3.7 mmol/L (ref 3.5–5.1)
Sodium: 138 mmol/L (ref 135–145)
Total Bilirubin: 1.3 mg/dL — ABNORMAL HIGH (ref 0.3–1.2)
Total Protein: 5.5 g/dL — ABNORMAL LOW (ref 6.5–8.1)

## 2018-11-21 LAB — TSH: TSH: 1.57 u[IU]/mL (ref 0.350–4.500)

## 2018-11-21 LAB — URINE CULTURE: Culture: NO GROWTH

## 2018-11-21 LAB — CBC
HCT: 37.3 % — ABNORMAL LOW (ref 39.0–52.0)
Hemoglobin: 12 g/dL — ABNORMAL LOW (ref 13.0–17.0)
MCH: 31.3 pg (ref 26.0–34.0)
MCHC: 32.2 g/dL (ref 30.0–36.0)
MCV: 97.4 fL (ref 80.0–100.0)
Platelets: 92 10*3/uL — ABNORMAL LOW (ref 150–400)
RBC: 3.83 MIL/uL — ABNORMAL LOW (ref 4.22–5.81)
RDW: 12.9 % (ref 11.5–15.5)
WBC: 12.5 10*3/uL — ABNORMAL HIGH (ref 4.0–10.5)
nRBC: 0 % (ref 0.0–0.2)

## 2018-11-21 LAB — PHOSPHORUS: Phosphorus: 2.6 mg/dL (ref 2.5–4.6)

## 2018-11-21 LAB — LACTIC ACID, PLASMA: Lactic Acid, Venous: 0.6 mmol/L (ref 0.5–1.9)

## 2018-11-21 MED ORDER — SODIUM CHLORIDE 0.9 % IV SOLN
INTRAVENOUS | Status: AC
  Administered 2018-11-21 – 2018-11-22 (×2): via INTRAVENOUS

## 2018-11-21 MED ORDER — DIVALPROEX SODIUM 125 MG PO CSDR
250.0000 mg | DELAYED_RELEASE_CAPSULE | Freq: Two times a day (BID) | ORAL | Status: DC
  Administered 2018-11-21 – 2018-11-23 (×4): 250 mg via ORAL
  Filled 2018-11-21 (×5): qty 2

## 2018-11-21 NOTE — Progress Notes (Signed)
[facility]  PROGRESS NOTE  Kenneth SellMichael R Lloyd ZOX:[id number] DOB: [birth year] DOA: 11/20/2018 PCP: Elijio MilesWeaver, John W., MD   Brief HPI:   63 year old male with a history of dementia, Hypertension, seizure was sent from skilled nursing facility with poor p.o. intake and foul-smelling urine.  Patient has a history of underlying dementia and unable to provide any history.  Patient started on IV ceftriaxone, urine culture obtained.   Subjective   Patient seen and examined, continues to be alert, talks very little at baseline.  Denies any pain.   Assessment/Plan:    1. UTI- patient has a normal UA, decreased p.o. intake, smelly urine.  Started on Rocephin.  Urine culture has been obtained.  Will await final culture results.  2. History of seizure-patient had seizure in April, continue Depakote.  3. Sepsis- secondary to above, follow blood and urine culture results.  4. Dementia- stable, no behavior disturbance.     CBC: Recent Labs  Lab 11/20/18 1528 11/21/18 0225  WBC 14.2* 12.5*  HGB 13.2 12.0*  HCT 38.5* 37.3*  MCV 92.8 97.4  PLT 135* 92*    Basic Metabolic Panel: Recent Labs  Lab 11/20/18 1528 11/21/18 0225  NA 138 138  K 3.7 3.7  CL 104 112*  CO2 25 20*  GLUCOSE 125* 105*  BUN 13 11  CREATININE 0.95 0.71  CALCIUM 8.9 7.6*  MG  --  2.0  PHOS  --  2.6     DVT prophylaxis: Lovenox  Code Status: DNR  Family Communication: No family at bedside  Disposition Plan: likely home when medically ready for discharge   Scheduled medications:  . ALPRAZolam  0.5 mg Oral BID AC  . ALPRAZolam  1 mg Oral QHS  . divalproex  250 mg Oral BID  . enoxaparin (LOVENOX) injection  40 mg Subcutaneous QHS  . traZODone  200 mg Oral QHS    Consultants:  None  Procedures:  None   Antibiotics:   Anti-infectives (From admission, onward)   Start     Dose/Rate Route Frequency Ordered Stop   11/21/18 1800  cefTRIAXone (ROCEPHIN) 1 g in sodium chloride 0.9 % 100 mL IVPB      1 g 200 mL/hr over 30 Minutes Intravenous Every 24 hours 11/20/18 1920     11/20/18 1615  ceFEPIme (MAXIPIME) 2 g in sodium chloride 0.9 % 100 mL IVPB     2 g 200 mL/hr over 30 Minutes Intravenous  Once 11/20/18 1605 11/20/18 1721   11/20/18 1615  vancomycin (VANCOCIN) IVPB 1000 mg/200 mL premix     1,000 mg 200 mL/hr over 60 Minutes Intravenous  Once 11/20/18 1605 11/20/18 1753       Objective   Vitals:   11/21/18 0600 11/21/18 0700 11/21/18 0756 11/21/18 0800  BP: 107/80 (!) 121/53  131/68  Pulse: 62 62  (!) 52  Resp: 18 16  10   Temp:   (!) 97.2 F (36.2 C)   TempSrc:   Axillary   SpO2: 99% 100%  100%  Weight:      Height:        Intake/Output Summary (Last 24 hours) at 11/21/2018 1022 Last data filed at 11/21/2018 0700 Gross per 24 hour  Intake 4132.3 ml  Output 270 ml  Net 3862.3 ml   Filed Weights   11/20/18 2300  Weight: 58 kg     Physical Examination:    General: Appears in no acute distress  Cardiovascular: S1-S2, regular  Respiratory: Clear to auscultation bilaterally, no  wheezing or crackles  Abdomen: Abdomen is soft, nontender, no organomegaly  Extremities: No edema in the lower extremities  Neurologic: Alert, not oriented x2.  Oriented to self only.     Data Reviewed: I have personally reviewed following labs and imaging studies   Recent Results (from the past 240 hour(s))  SARS Coronavirus 2 [facility] order, Performed in [place] hospital lab)     Status: None   Collection Time: 11/20/18  7:00 PM  Result Value Ref Range Status   SARS Coronavirus 2 NEGATIVE NEGATIVE Final    Comment: (NOTE) If result is NEGATIVE SARS-CoV-2 target nucleic acids are NOT DETECTED. The SARS-CoV-2 RNA is generally detectable in upper and lower  respiratory specimens during the acute phase of infection. The lowest  concentration of SARS-CoV-2 viral copies this assay can detect is 250  copies / mL. A negative result does not preclude SARS-CoV-2  infection  and should not be used as the sole basis for treatment or other  patient management decisions.  A negative result may occur with  improper specimen collection / handling, submission of specimen other  than nasopharyngeal swab, presence of viral mutation(s) within the  areas targeted by this assay, and inadequate number of viral copies  (<250 copies / mL). A negative result must be combined with clinical  observations, patient history, and epidemiological information. If result is POSITIVE SARS-CoV-2 target nucleic acids are DETECTED. The SARS-CoV-2 RNA is generally detectable in upper and lower  respiratory specimens dur ing the acute phase of infection.  Positive  results are indicative of active infection with SARS-CoV-2.  Clinical  correlation with patient history and other diagnostic information is  necessary to determine patient infection status.  Positive results do  not rule out bacterial infection or co-infection with other viruses. If result is PRESUMPTIVE POSTIVE SARS-CoV-2 nucleic acids MAY BE PRESENT.   A presumptive positive result was obtained on the submitted specimen  and confirmed on repeat testing.  While 2019 novel coronavirus  (SARS-CoV-2) nucleic acids may be present in the submitted sample  additional confirmatory testing may be necessary for epidemiological  and / or clinical management purposes  to differentiate between  SARS-CoV-2 and other Sarbecovirus currently known to infect humans.  If clinically indicated additional testing with an alternate test  methodology [phone]) is advised. The SARS-CoV-2 RNA is generally  detectable in upper and lower respiratory sp ecimens during the acute  phase of infection. The expected result is Negative. Fact Sheet for Patients:  StrictlyIdeas.no Fact Sheet for Healthcare Providers: BankingDealers.co.za This test is not yet approved or cleared by the [place]  FDA and has been authorized for detection and/or diagnosis of SARS-CoV-2 by FDA under an Emergency Use Authorization (EUA).  This EUA will remain in effect (meaning this test can be used) for the duration of the COVID-19 declaration under Section 564(b)(1) of the Act, 21 U.S.C. section 360bbb-3(b)(1), unless the authorization is terminated or revoked sooner. Performed at [facility], [place] [address] [place], [place] [address]      Liver Function Tests: Recent Labs  Lab 11/20/18 1528 11/21/18 0225  AST 23 19  ALT 21 16  ALKPHOS 52 41  BILITOT 2.3* 1.3*  PROT 6.5 5.5*  ALBUMIN 3.3* 2.7*      Studies: Dg Chest Port 1 View  Result Date: 11/20/2018 CLINICAL DATA:  Weakness brought in by EMS EXAM: PORTABLE CHEST 1 VIEW COMPARISON:  Radiograph 10/02/2018 FINDINGS: Findings suggesting mild interstitial edema with peripheral septal  lines and hazy interstitial opacity. No focal consolidation. No pneumothorax or effusion. Cardiomediastinal contours are stable from prior studies. Degenerative changes are present in the imaged shoulders and spine. IMPRESSION: Mild interstitial edema. Electronically Signed   By: Kreg ShropshirePrice  DeHay M.D.   On: 11/20/2018 16:16     Time spent: 20 min  Meredeth IdeGagan S Radiance Deady   [facility] Pager [phone]. If 7PM-7AM, please contact night-coverage at [email or web address], Office  [phone]  password [email or web address]  11/21/2018, 10:22 AM  LOS: 0 days

## 2018-11-22 DIAGNOSIS — R531 Weakness: Secondary | ICD-10-CM

## 2018-11-22 DIAGNOSIS — F0391 Unspecified dementia with behavioral disturbance: Secondary | ICD-10-CM | POA: Diagnosis not present

## 2018-11-22 LAB — COMPREHENSIVE METABOLIC PANEL
ALT: 15 U/L (ref 0–44)
AST: 18 U/L (ref 15–41)
Albumin: 2.7 g/dL — ABNORMAL LOW (ref 3.5–5.0)
Alkaline Phosphatase: 45 U/L (ref 38–126)
Anion gap: 8 (ref 5–15)
BUN: 8 mg/dL (ref 8–23)
CO2: 24 mmol/L (ref 22–32)
Calcium: 7.9 mg/dL — ABNORMAL LOW (ref 8.9–10.3)
Chloride: 108 mmol/L (ref 98–111)
Creatinine, Ser: 0.75 mg/dL (ref 0.61–1.24)
GFR calc Af Amer: >60 mL/min (ref >60)
GFR calc non Af Amer: >60 mL/min (ref >60)
Glucose, Bld: 82 mg/dL (ref 70–99)
Potassium: 3.6 mmol/L (ref 3.5–5.1)
Sodium: 140 mmol/L (ref 135–145)
Total Bilirubin: 0.6 mg/dL (ref 0.3–1.2)
Total Protein: 5.5 g/dL — ABNORMAL LOW (ref 6.5–8.1)

## 2018-11-22 LAB — CBC
HCT: 35.5 % — ABNORMAL LOW (ref 39.0–52.0)
Hemoglobin: 11.4 g/dL — ABNORMAL LOW (ref 13.0–17.0)
MCH: 30.6 pg (ref 26.0–34.0)
MCHC: 32.1 g/dL (ref 30.0–36.0)
MCV: 95.2 fL (ref 80.0–100.0)
Platelets: 113 10*3/uL — ABNORMAL LOW (ref 150–400)
RBC: 3.73 MIL/uL — ABNORMAL LOW (ref 4.22–5.81)
RDW: 12.9 % (ref 11.5–15.5)
WBC: 7 10*3/uL (ref 4.0–10.5)
nRBC: 0 % (ref 0.0–0.2)

## 2018-11-22 LAB — HIV ANTIBODY (ROUTINE TESTING W REFLEX): HIV Screen 4th Generation wRfx: NONREACTIVE

## 2018-11-22 MED ORDER — HALOPERIDOL LACTATE 5 MG/ML IJ SOLN
INTRAMUSCULAR | Status: AC
  Administered 2018-11-22: 05:00:00 1 mg via INTRAVENOUS
  Filled 2018-11-22: qty 1

## 2018-11-22 MED ORDER — HALOPERIDOL LACTATE 5 MG/ML IJ SOLN
1.0000 mg | Freq: Once | INTRAMUSCULAR | Status: AC
  Administered 2018-11-22: 1 mg via INTRAVENOUS

## 2018-11-22 MED ORDER — ALPRAZOLAM 0.5 MG PO TABS
0.5000 mg | ORAL_TABLET | Freq: Two times a day (BID) | ORAL | Status: DC
  Administered 2018-11-22 – 2018-11-23 (×3): 0.5 mg via ORAL
  Filled 2018-11-22 (×3): qty 1

## 2018-11-22 MED ORDER — CHLORHEXIDINE GLUCONATE CLOTH 2 % EX PADS: 6.0000 | MEDICATED_PAD | Freq: Every day | CUTANEOUS | Status: DC

## 2018-11-22 MED ORDER — LORAZEPAM 2 MG/ML IJ SOLN
INTRAMUSCULAR | Status: AC
  Administered 2018-11-22: 0.5 mg via INTRAVENOUS
  Filled 2018-11-22: qty 1

## 2018-11-22 MED ORDER — TRAZODONE HCL 100 MG PO TABS
100.0000 mg | ORAL_TABLET | Freq: Every day | ORAL | Status: DC
  Administered 2018-11-22: 100 mg via ORAL
  Filled 2018-11-22: qty 1

## 2018-11-22 MED ORDER — TRAZODONE HCL 100 MG PO TABS: 200.0000 mg | ORAL_TABLET | Freq: Every day | ORAL | Status: DC

## 2018-11-22 MED ORDER — LORAZEPAM 2 MG/ML IJ SOLN
0.5000 mg | Freq: Once | INTRAMUSCULAR | Status: AC
  Administered 2018-11-22: 03:00:00 0.5 mg via INTRAVENOUS

## 2018-11-22 MED ORDER — SODIUM CHLORIDE 0.9 % IV SOLN
INTRAVENOUS | Status: AC
  Administered 2018-11-22 (×2): via INTRAVENOUS

## 2018-11-22 NOTE — Progress Notes (Signed)
[facility]  PROGRESS NOTE  Kenneth Mcdaniel QBH:[id number] DOB: [birth year] DOA: 11/20/2018 PCP: Derrill Center., MD   Brief HPI:   63 year old male with a history of dementia, Hypertension, seizure was sent from skilled nursing facility with poor p.o. intake and foul-smelling urine.  Patient has a history of underlying dementia and unable to provide any history.  Patient started on IV ceftriaxone, urine culture obtained.  Subjective   Patient seen and examined, confused this morning.   Assessment/Plan:   1. UTI- patient has a normal UA, decreased p.o. intake, smelly urine.  Started on Rocephin.  Urine culture has been obtained.  Urine culture showed no growth.  2. History of seizure- patient had seizure in April, continue Depakote.  3. Sepsis- secondary to above, follow blood  culture results.  4. Dementia- continue Depakote, patient is now confused.  Will restart Xanax 0.5 mg p.o. twice daily.   CBC: Recent Labs  Lab 11/20/18 1528 11/21/18 0225 11/22/18 0220  WBC 14.2* 12.5* 7.0  HGB 13.2 12.0* 11.4*  HCT 38.5* 37.3* 35.5*  MCV 92.8 97.4 95.2  PLT 135* 92* 113*    Basic Metabolic Panel: Recent Labs  Lab 11/20/18 1528 11/21/18 0225 11/22/18 0220  NA 138 138 140  K 3.7 3.7 3.6  CL 104 112* 108  CO2 25 20* 24  GLUCOSE 125* 105* 82  BUN 13 11 8   CREATININE 0.95 0.71 0.75  CALCIUM 8.9 7.6* 7.9*  MG  --  2.0  --   PHOS  --  2.6  --      DVT prophylaxis: Lovenox  Code Status: DNR  Family Communication: No family at bedside  Disposition Plan: likely home when medically ready for discharge   Scheduled medications:  . ALPRAZolam  0.5 mg Oral BID  . Chlorhexidine Gluconate Cloth  6 each Topical Daily  . divalproex  250 mg Oral Q12H  . enoxaparin (LOVENOX) injection  40 mg Subcutaneous QHS  . traZODone  200 mg Oral QHS    Consultants:  None  Procedures:  None   Antibiotics:   Anti-infectives (From admission, onward)   Start     Dose/Rate  Route Frequency Ordered Stop   11/21/18 1800  cefTRIAXone (ROCEPHIN) 1 g in sodium chloride 0.9 % 100 mL IVPB     1 g 200 mL/hr over 30 Minutes Intravenous Every 24 hours 11/20/18 1920     11/20/18 1615  ceFEPIme (MAXIPIME) 2 g in sodium chloride 0.9 % 100 mL IVPB     2 g 200 mL/hr over 30 Minutes Intravenous  Once 11/20/18 1605 11/20/18 1721   11/20/18 1615  vancomycin (VANCOCIN) IVPB 1000 mg/200 mL premix     1,000 mg 200 mL/hr over 60 Minutes Intravenous  Once 11/20/18 1605 11/20/18 1753       Objective   Vitals:   11/22/18 1000 11/22/18 1100 11/22/18 1200 11/22/18 1300  BP: 140/66 123/68 135/78 114/67  Pulse: 65 (!) 58 (!) 59 (!) 52  Resp: (!) 23 14 11 12   Temp:   98.3 F (36.8 C)   TempSrc:   Axillary   SpO2: 100% 100% 97% 98%  Weight:      Height:        Intake/Output Summary (Last 24 hours) at 11/22/2018 1339 Last data filed at 11/22/2018 1300 Gross per 24 hour  Intake 437.8 ml  Output 1927 ml  Net -1489.2 ml   Filed Weights   11/20/18 2300  Weight: 58 kg  Physical Examination:   General-appears confused  Heart-S1-S2, regular, no murmur auscultated  Lungs-clear to auscultation bilaterally, no wheezing or crackles auscultated  Abdomen-soft, nontender, no organomegaly  Extremities-no edema in the lower extremities  Neuro-alert, confused, moving all extremities     Data Reviewed: I have personally reviewed following labs and imaging studies   Recent Results (from the past 240 hour(s))  Blood Culture (routine x 2)     Status: None (Preliminary result)   Collection Time: 11/20/18  3:28 PM   Specimen: BLOOD  Result Value Ref Range Status   Specimen Description   Final    BLOOD RIGHT ANTECUBITAL Performed at [facility], [address] [place], [place] [address]    Special Requests   Final    BOTTLES DRAWN AEROBIC AND ANAEROBIC Blood Culture adequate volume Performed at [facility], [address]  [place], [place] [address]    Culture   Final    NO GROWTH 2 DAYS Performed at [facility], [address] [place], [place] [address]    Report Status PENDING  Incomplete  Blood Culture (routine x 2)     Status: None (Preliminary result)   Collection Time: 11/20/18  3:30 PM   Specimen: BLOOD LEFT FOREARM  Result Value Ref Range Status   Specimen Description   Final    BLOOD LEFT FOREARM Performed at [facility], [address] [place], [place] [address]    Special Requests   Final    BOTTLES DRAWN AEROBIC AND ANAEROBIC Blood Culture adequate volume Performed at [facility], [address] [place], [place] [address]    Culture   Final    NO GROWTH 2 DAYS Performed at [facility], [address] [place], [place] [address]    Report Status PENDING  Incomplete  Urine culture     Status: None   Collection Time: 11/20/18  5:46 PM   Specimen: In/Out Cath Urine  Result Value Ref Range Status   Specimen Description   Final    IN/OUT CATH URINE Performed at [facility], [address] [place], [place] [address]    Special Requests   Final    NONE Performed at [facility], [address] [place], [place] [address]    Culture   Final    NO GROWTH Performed at [facility], [address] [place], [place] [address]    Report Status 11/21/2018 FINAL  Final  SARS Coronavirus 2 [facility](Hospital order, Performed in [facility] lab)     Status: None   Collection Time: 11/20/18  7:00 PM  Result Value Ref Range Status   SARS Coronavirus 2 NEGATIVE NEGATIVE Final    Comment: (NOTE) If result is NEGATIVE SARS-CoV-2 target nucleic acids are NOT DETECTED. The SARS-CoV-2 RNA is generally detectable in upper and lower  respiratory specimens during the acute phase of infection. The lowest  concentration of SARS-CoV-2 viral copies this assay can detect is 250  copies / mL. A negative result  does not preclude SARS-CoV-2 infection  and should not be used as the sole basis for treatment or other  patient management decisions.  A negative result may occur with  improper specimen collection / handling, submission of specimen other  than nasopharyngeal swab, presence of viral mutation(s) within the  areas targeted by this assay, and inadequate number of viral copies  (<250 copies / mL). A negative result  must be combined with clinical  observations, patient history, and epidemiological information. If result is POSITIVE SARS-CoV-2 target nucleic acids are DETECTED. The SARS-CoV-2 RNA is generally detectable in upper and lower  respiratory specimens dur ing the acute phase of infection.  Positive  results are indicative of active infection with SARS-CoV-2.  Clinical  correlation with patient history and other diagnostic information is  necessary to determine patient infection status.  Positive results do  not rule out bacterial infection or co-infection with other viruses. If result is PRESUMPTIVE POSTIVE SARS-CoV-2 nucleic acids MAY BE PRESENT.   A presumptive positive result was obtained on the submitted specimen  and confirmed on repeat testing.  While 2019 novel coronavirus  (SARS-CoV-2) nucleic acids may be present in the submitted sample  additional confirmatory testing may be necessary for epidemiological  and / or clinical management purposes  to differentiate between  SARS-CoV-2 and other Sarbecovirus currently known to infect humans.  If clinically indicated additional testing with an alternate test  methodology [phone]([id number]) is advised. The SARS-CoV-2 RNA is generally  detectable in upper and lower respiratory sp ecimens during the acute  phase of infection. The expected result is Negative. Fact Sheet for Patients:  BoilerBrush.com.cyhttps://www.fda.gov/media/136312/download Fact Sheet for Healthcare Providers: https://pope.com/https://www.fda.gov/media/136313/download This test is not yet approved or  cleared by the [place] FDA and has been authorized for detection and/or diagnosis of SARS-CoV-2 by FDA under an Emergency Use Authorization (EUA).  This EUA will remain in effect (meaning this test can be used) for the duration of the COVID-19 declaration under Section 564(b)(1) of the Act, 21 U.S.C. section 360bbb-3(b)(1), unless the authorization is terminated or revoked sooner. Performed at [facility], [address] [place], [place] [address]      Liver Function Tests: Recent Labs  Lab 11/20/18 1528 11/21/18 0225 11/22/18 0220  AST 23 19 18   ALT 21 16 15   ALKPHOS 52 41 45  BILITOT 2.3* 1.3* 0.6  PROT 6.5 5.5* 5.5*  ALBUMIN 3.3* 2.7* 2.7*      Studies: Dg Chest Port 1 View  Result Date: 11/20/2018 CLINICAL DATA:  Weakness brought in by EMS EXAM: PORTABLE CHEST 1 VIEW COMPARISON:  Radiograph 10/02/2018 FINDINGS: Findings suggesting mild interstitial edema with peripheral septal lines and hazy interstitial opacity. No focal consolidation. No pneumothorax or effusion. Cardiomediastinal contours are stable from prior studies. Degenerative changes are present in the imaged shoulders and spine. IMPRESSION: Mild interstitial edema. Electronically Signed   By: Kreg ShropshirePrice  DeHay M.D.   On: 11/20/2018 16:16     Time spent: 20 min  Meredeth IdeGagan S Merdith Adan   [facility] Pager [phone]. If 7PM-7AM, please contact night-coverage at [email or web address], Office  [phone]  password [email or web address]  11/22/2018, 1:39 PM  LOS: 0 days

## 2018-11-22 NOTE — TOC Initial Note (Signed)
Transition of Care [facility]) - Initial/Assessment Note    Patient Details  Name: Kenneth Mcdaniel MRN: [id number] Date of Birth: [birth year]  Transition of Care [facility]) CM/SW Contact:    Nila Nephew, LCSW Phone Number: [phone] 11/22/2018, 11:27 AM  Clinical Narrative:      Pt admitted with UTI from [facility] memory care (alf).  Pt ambulates independently at baseline, is minimally verbal and is pleasant, and oriented to self and sometimes to place. Facility aware of pt's admission, will need updated FL2 with any added care needs at DC.             Expected Discharge Plan: Assisted Living Barriers to Discharge: Continued Medical Work up   Patient Goals and CMS Choice Patient states their goals for this hospitalization and ongoing recovery are:: pt did not participate      Expected Discharge Plan and Services Expected Discharge Plan: Assisted Living In-house Referral: Clinical Social Work     Living arrangements for the past 2 months: [place] Expected Discharge Date: (unknown)                                    Prior Living Arrangements/Services Living arrangements for the past 2 months: [place] Lives with:: Facility Resident Patient language and need for interpreter reviewed:: No Do you feel safe going back to the place where you live?: Yes      Need for Family Participation in Patient Care: Yes (Comment)(wife) Care giver support system in place?: Yes (comment)(resident of memory care facility)   Criminal Activity/Legal Involvement Pertinent to Current Situation/Hospitalization: No - Comment as needed  Activities of Daily Living Home Assistive Devices/Equipment: None ADL Screening (condition at time of admission) Patient's cognitive ability adequate to safely complete daily activities?: No Is the patient deaf or have difficulty hearing?: No Does the patient have difficulty seeing, even when wearing glasses/contacts?:  No Does the patient have difficulty concentrating, remembering, or making decisions?: Yes Patient able to express need for assistance with ADLs?: No Does the patient have difficulty dressing or bathing?: Yes Independently performs ADLs?: No Communication: Independent Grooming: Needs assistance Is this a change from baseline?: Pre-admission baseline Feeding: Needs assistance Is this a change from baseline?: Pre-admission baseline Bathing: Needs assistance Is this a change from baseline?: Pre-admission baseline Toileting: Needs assistance Is this a change from baseline?: Pre-admission baseline In/Out Bed: Needs assistance Is this a change from baseline?: Pre-admission baseline Walks in Home: Needs assistance Is this a change from baseline?: Pre-admission baseline Does the patient have difficulty walking or climbing stairs?: Yes Weakness of Legs: Both Weakness of Arms/Hands: Both  Permission Sought/Granted Permission sought to share information with : Facility Sport and exercise psychologist, Family Supports    Share Information with NAME: [phone] wife Kenneth Mcdaniel  Permission granted to share info w AGENCY: [facility] memory care ALF [phone]        Emotional Assessment Appearance:: Appears stated age     Orientation: : Oriented to Self Alcohol / Substance Use: Not Applicable Psych Involvement: No (comment)  Admission diagnosis:  Generalized weakness [R53.1] Sepsis secondary to UTI ([place]) [A41.9, N39.0] Patient Active Problem List   Diagnosis Date Noted  . UTI (urinary tract infection) 11/20/2018  . Seizure ([place]) 11/20/2018  . Sepsis ([place]) 11/20/2018  . Dementia ([place]) 03/21/2017   PCP:  Derrill Center., MD Pharmacy:   [facility] [place] [place] - [id number] [place] [address] Phone: [phone] Fax: [phone]     Social Determinants of Health (SDOH) Interventions    Readmission Risk Interventions No  flowsheet data found.

## 2018-11-23 ENCOUNTER — Encounter (HOSPITAL_COMMUNITY): Payer: Self-pay

## 2018-11-23 ENCOUNTER — Observation Stay (HOSPITAL_COMMUNITY): Payer: BC Managed Care – PPO

## 2018-11-23 DIAGNOSIS — N390 Urinary tract infection, site not specified: Secondary | ICD-10-CM | POA: Diagnosis not present

## 2018-11-23 DIAGNOSIS — A419 Sepsis, unspecified organism: Secondary | ICD-10-CM | POA: Diagnosis not present

## 2018-11-23 DIAGNOSIS — F0391 Unspecified dementia with behavioral disturbance: Secondary | ICD-10-CM | POA: Diagnosis not present

## 2018-11-23 MED ORDER — ALPRAZOLAM 0.5 MG PO TABS: 0.5000 mg | ORAL_TABLET | Freq: Two times a day (BID) | ORAL | 0 refills | Status: AC

## 2018-11-23 MED ORDER — LORAZEPAM 2 MG/ML IJ SOLN
1.0000 mg | Freq: Once | INTRAMUSCULAR | Status: AC
  Administered 2018-11-23: 1 mg via INTRAVENOUS
  Filled 2018-11-23: qty 1

## 2018-11-23 MED ORDER — SODIUM CHLORIDE (PF) 0.9 % IJ SOLN
INTRAMUSCULAR | Status: AC
  Filled 2018-11-23: qty 50

## 2018-11-23 MED ORDER — CEPHALEXIN 500 MG PO CAPS: 500.0000 mg | ORAL_CAPSULE | Freq: Two times a day (BID) | ORAL | 0 refills | Status: AC

## 2018-11-23 MED ORDER — ENSURE ENLIVE PO LIQD: 237.0000 mL | Freq: Two times a day (BID) | ORAL | Status: DC

## 2018-11-23 MED ORDER — ALPRAZOLAM 1 MG PO TABS: 1.0000 mg | ORAL_TABLET | Freq: Every day | ORAL | 0 refills | Status: AC

## 2018-11-23 MED ORDER — IOHEXOL 350 MG/ML SOLN
100.0000 mL | Freq: Once | INTRAVENOUS | Status: AC | PRN
  Administered 2018-11-23: 13:00:00 80 mL via INTRAVENOUS

## 2018-11-23 NOTE — Plan of Care (Signed)
  Problem: Clinical Measurements: Goal: Respiratory complications will improve Outcome: Progressing   Problem: Clinical Measurements: Goal: Cardiovascular complication will be avoided Outcome: Progressing   Problem: Nutrition: Goal: Adequate nutrition will be maintained Outcome: Progressing   Problem: Coping: Goal: Level of anxiety will decrease Outcome: Progressing   Problem: Elimination: Goal: Will not experience complications related to bowel motility Outcome: Progressing   

## 2018-11-23 NOTE — Progress Notes (Signed)
Report called to [facility] memory care at 1350 to [facility] without complication. No needs at this time. Pt remains stable though confused. No changes to to note overall. Sitter remains at bedside.

## 2018-11-23 NOTE — Plan of Care (Signed)
Pt to d/c back to facility when ems arrives.

## 2018-11-23 NOTE — TOC Transition Note (Signed)
Transition of Care [facility]) - CM/SW Discharge Note   Patient Details  Name: Kenneth Mcdaniel MRN: [id number] Date of Birth: 04/28/1955  [facility] CM/SW Contact:  Servando Snare, LCSW Phone Number: 11/23/2018, 12:30 PM   Clinical Narrative:   Patient returning to [facility]. LCSW notified Spouse.   RN report #: [phone]    Final next level of care: Memory Care Barriers to Discharge: No Barriers Identified   Patient Goals and CMS Choice Patient states their goals for this hospitalization and ongoing recovery are:: pt did not participate   Choice offered to / list presented to : NA  Discharge Placement              Patient chooses bed at: ([place]) Patient to be transferred to facility by: EMS Name of family member notified: Spouse Patient and family notified of of transfer: 11/23/18  Discharge Plan and Services In-house Referral: Clinical Social Work                                   Social Determinants of Health ([place]) Interventions     Readmission Risk Interventions No flowsheet data found.

## 2018-11-23 NOTE — Progress Notes (Signed)
Pt stable though remains confused and pulling at tubes. We are attempting a CT of chest on pt prior to pt d/c back to facility. CT staff and md made aware of pt behavior at this time. Rn working with social work for pt to return to [facility].

## 2018-11-23 NOTE — Progress Notes (Signed)
Pt stable at time of EMS arrival. Pt vs remain stable. Pt confused overall but this is his baseline. No needs at time of d/c to [facility] memory care.

## 2018-11-23 NOTE — Discharge Summary (Addendum)
Physician Discharge Summary  Kenneth Mcdaniel FAO:[id number] DOB: 1955-11-26 DOA: 11/20/2018  PCP: Derrill Center., MD  Admit date: 11/20/2018 Discharge date: 11/23/2018  Time spent: 40* minutes  Recommendations for Outpatient Follow-up:  1. Continue taking Keflex 500 p.o. twice daily for 3 more days.   Discharge Diagnoses:  Active Problems:   Dementia ([place])   UTI (urinary tract infection)   Seizure ([place])   Sepsis ([place])   Discharge Condition: Stable  Diet recommendation: Regular diet  Filed Weights   11/20/18 2300  Weight: 58 kg    History of present illness:  63 year old male with a history of dementia, Hypertension, seizure was sent from skilled nursing facility with poor p.o. intake and foul-smelling urine.  Patient has a history of underlying dementia and unable to provide any history.  Patient started on IV ceftriaxone, urine culture obtained  Hospital Course:   1. UTI- patient had abnormal UA, decreased p.o. intake, smelly urine.  Started on Rocephin.  Urine culture showed no growth.  At this time patient is clinically improved.  Even though his urine culture was negative.  Will discharge on Keflex 500 mg p.o. twice daily for 3 more days as clinically improved on Rocephin.   2. History of seizure- patient had seizure in April, continue Depakote.  3. Sepsis- secondary to above, follow blood  culture results.  4. Dementia- continue Depakote, patient became confused in the hospital, Xanax was restarted.   5. Elevated d-dimer-patient had mild elevation of d-dimer at the time of admission,  CTA chest is negative for PE.  6. Mild compression fracture - mild compression fracture noted at T6, continue pain control.   Procedures:    Consultations:    Discharge Exam: Vitals:   11/22/18 2100 11/23/18 0619  BP: (!) 143/75 128/78  Pulse: 72 68  Resp: 18 18  Temp: 98.2 F (36.8 C) 98.2 F (36.8 C)  SpO2: 99% 98%    General: Appears in no acute distress,  confused at baseline Cardiovascular: S1-S2, regular, no murmur auscultated Respiratory: Abdomen is soft, nontender, no organomegaly  Discharge Instructions   Discharge Instructions    Diet - low sodium heart healthy   Complete by: As directed    Increase activity slowly   Complete by: As directed      Allergies as of 11/23/2018   No Known Allergies     Medication List    STOP taking these medications   ketorolac 0.5 % ophthalmic solution Commonly known as: ACULAR   valproic acid 250 MG/5ML Soln solution Commonly known as: DEPAKENE     TAKE these medications   ALPRAZolam 1 MG tablet Commonly known as: XANAX Take 1 tablet (1 mg total) by mouth at bedtime.   ALPRAZolam 0.5 MG tablet Commonly known as: XANAX Take 1 tablet (0.5 mg total) by mouth 2 (two) times daily.   cephALEXin 500 MG capsule Commonly known as: KEFLEX Take 1 capsule (500 mg total) by mouth 2 (two) times daily for 3 days.   divalproex 125 MG capsule Commonly known as: DEPAKOTE SPRINKLE Take 250 mg by mouth 2 (two) times daily.   feeding supplement (GLUCERNA SHAKE) Liqd Take 237 mLs by mouth 3 (three) times daily between meals. Drink 1 shake (237 ml's) by mouth three times a day and with any snacks (CHOCOLATE)   traZODone 100 MG tablet Commonly known as: DESYREL Take 200 mg by mouth at bedtime.      No Known Allergies    The results of significant diagnostics  from this hospitalization (including imaging, microbiology, ancillary and laboratory) are listed below for reference.    Significant Diagnostic Studies: Dg Chest Port 1 View  Result Date: 11/20/2018 CLINICAL DATA:  Weakness brought in by EMS EXAM: PORTABLE CHEST 1 VIEW COMPARISON:  Radiograph 10/02/2018 FINDINGS: Findings suggesting mild interstitial edema with peripheral septal lines and hazy interstitial opacity. No focal consolidation. No pneumothorax or effusion. Cardiomediastinal contours are stable from prior studies. Degenerative  changes are present in the imaged shoulders and spine. IMPRESSION: Mild interstitial edema. Electronically Signed   By: Kreg ShropshirePrice  DeHay M.D.   On: 11/20/2018 16:16    Microbiology: Recent Results (from the past 240 hour(s))  Blood Culture (routine x 2)     Status: None (Preliminary result)   Collection Time: 11/20/18  3:28 PM   Specimen: BLOOD  Result Value Ref Range Status   Specimen Description   Final    BLOOD RIGHT ANTECUBITAL Performed at [facility], [address] [place], [place] [address]    Special Requests   Final    BOTTLES DRAWN AEROBIC AND ANAEROBIC Blood Culture adequate volume Performed at [facility], [address] [place], [place] [address]    Culture   Final    NO GROWTH 3 DAYS Performed at [facility], [address] [place], [place] [address]    Report Status PENDING  Incomplete  Blood Culture (routine x 2)     Status: None (Preliminary result)   Collection Time: 11/20/18  3:30 PM   Specimen: BLOOD LEFT FOREARM  Result Value Ref Range Status   Specimen Description   Final    BLOOD LEFT FOREARM Performed at [facility], [address] [place], [place] [address]    Special Requests   Final    BOTTLES DRAWN AEROBIC AND ANAEROBIC Blood Culture adequate volume Performed at [facility], [address] [place], [place] [address]    Culture   Final    NO GROWTH 3 DAYS Performed at [facility], [address] [place], [place] [address]    Report Status PENDING  Incomplete  Urine culture     Status: None   Collection Time: 11/20/18  5:46 PM   Specimen: In/Out Cath Urine  Result Value Ref Range Status   Specimen Description   Final    IN/OUT CATH URINE Performed at [facility], [address] [place], [place] [address]    Special Requests   Final    NONE Performed at [facility], [address] [place], [place]  [address]    Culture   Final    NO GROWTH Performed at [facility], [address] [place], [place] [address]    Report Status 11/21/2018 FINAL  Final  SARS Coronavirus 2 [facility](Hospital order, Performed in [facility] hospital lab)     Status: None   Collection Time: 11/20/18  7:00 PM  Result Value Ref Range Status   SARS Coronavirus 2 NEGATIVE NEGATIVE Final    Comment: (NOTE) If result is NEGATIVE SARS-CoV-2 target nucleic acids are NOT DETECTED. The SARS-CoV-2 RNA is generally detectable in upper and lower  respiratory specimens during the acute phase of infection. The lowest  concentration of SARS-CoV-2 viral copies this assay can detect is 250  copies / mL. A negative result does not preclude SARS-CoV-2 infection  and should not be used as the sole basis for treatment or other  patient  management decisions.  A negative result may occur with  improper specimen collection / handling, submission of specimen other  than nasopharyngeal swab, presence of viral mutation(s) within the  areas targeted by this assay, and inadequate number of viral copies  (<250 copies / mL). A negative result must be combined with clinical  observations, patient history, and epidemiological information. If result is POSITIVE SARS-CoV-2 target nucleic acids are DETECTED. The SARS-CoV-2 RNA is generally detectable in upper and lower  respiratory specimens dur ing the acute phase of infection.  Positive  results are indicative of active infection with SARS-CoV-2.  Clinical  correlation with patient history and other diagnostic information is  necessary to determine patient infection status.  Positive results do  not rule out bacterial infection or co-infection with other viruses. If result is PRESUMPTIVE POSTIVE SARS-CoV-2 nucleic acids MAY BE PRESENT.   A presumptive positive result was obtained on the submitted specimen  and confirmed on repeat testing.  While 2019 novel coronavirus  (SARS-CoV-2)  nucleic acids may be present in the submitted sample  additional confirmatory testing may be necessary for epidemiological  and / or clinical management purposes  to differentiate between  SARS-CoV-2 and other Sarbecovirus currently known to infect humans.  If clinically indicated additional testing with an alternate test  methodology [phone]([id number]) is advised. The SARS-CoV-2 RNA is generally  detectable in upper and lower respiratory sp ecimens during the acute  phase of infection. The expected result is Negative. Fact Sheet for Patients:  BoilerBrush.com.cyhttps://www.fda.gov/media/136312/download Fact Sheet for Healthcare Providers: https://pope.com/https://www.fda.gov/media/136313/download This test is not yet approved or cleared by the [place] FDA and has been authorized for detection and/or diagnosis of SARS-CoV-2 by FDA under an Emergency Use Authorization (EUA).  This EUA will remain in effect (meaning this test can be used) for the duration of the COVID-19 declaration under Section 564(b)(1) of the Act, 21 U.S.C. section 360bbb-3(b)(1), unless the authorization is terminated or revoked sooner. Performed at [facility], [address] [place], [place] [address]      Labs: Basic Metabolic Panel: Recent Labs  Lab 11/20/18 1528 11/21/18 0225 11/22/18 0220  NA 138 138 140  K 3.7 3.7 3.6  CL 104 112* 108  CO2 25 20* 24  GLUCOSE 125* 105* 82  BUN 13 11 8   CREATININE 0.95 0.71 0.75  CALCIUM 8.9 7.6* 7.9*  MG  --  2.0  --   PHOS  --  2.6  --    Liver Function Tests: Recent Labs  Lab 11/20/18 1528 11/21/18 0225 11/22/18 0220  AST 23 19 18   ALT 21 16 15   ALKPHOS 52 41 45  BILITOT 2.3* 1.3* 0.6  PROT 6.5 5.5* 5.5*  ALBUMIN 3.3* 2.7* 2.7*   No results for input(s): LIPASE, AMYLASE in the last 168 hours. No results for input(s): AMMONIA in the last 168 hours. CBC: Recent Labs  Lab 11/20/18 1528 11/21/18 0225 11/22/18 0220  WBC 14.2* 12.5* 7.0  HGB 13.2 12.0* 11.4*  HCT  38.5* 37.3* 35.5*  MCV 92.8 97.4 95.2  PLT 135* 92* 113*    Signed:  Meredeth IdeGagan S Hanley Rispoli MD.  [facility] 11/23/2018, 12:04 PM

## 2018-11-23 NOTE — NC FL2 (Addendum)
[place] LEVEL OF CARE SCREENING TOOL     IDENTIFICATION  Patient Name: Kenneth Mcdaniel Birthdate: 1955-05-01 Sex: male Admission Date (Current Location): 11/20/2018  [facility] and [place] Number:  Herbalist and Address:  [facility],  [place] [address] [place]      Provider Number: [id number]  Attending Physician Name and Address:  [name], MD  Relative Name and Phone Number:       Current Level of Care: Hospital Recommended Level of Care: Memory Care, [place] Prior Approval Number:    Date Approved/Denied:   PASRR Number:    Discharge Plan: Other (Comment)(ALF/Memory care)    Current Diagnoses: Patient Active Problem List   Diagnosis Date Noted  . UTI (urinary tract infection) 11/20/2018  . Seizure ([place]) 11/20/2018  . Sepsis ([place]) 11/20/2018  . Dementia ([place]) 03/21/2017    Orientation RESPIRATION BLADDER Height & Weight     Self  Normal Continent Weight: 127 lb 13.9 oz (58 kg) Height:  5\' 8"  (172.7 cm)  BEHAVIORAL SYMPTOMS/MOOD NEUROLOGICAL BOWEL NUTRITION STATUS      Incontinent Diet(see dc)  AMBULATORY STATUS COMMUNICATION OF NEEDS Skin   Limited Assist Verbally Normal                       Personal Care Assistance Level of Assistance  Bathing, Dressing, Feeding Bathing Assistance: Limited assistance Feeding assistance: Limited assistance Dressing Assistance: Limited assistance     Functional Limitations Info  Sight, Hearing, Speech Sight Info: Adequate Hearing Info: Adequate Speech Info: Adequate    SPECIAL CARE FACTORS FREQUENCY                       Contractures Contractures Info: Not present    Additional Factors Info  Code Status, Allergies Code Status Info: DNR Allergies Info: NKA           Current Medications (11/23/2018):  This is the current hospital active medication list Current Facility-Administered Medications  Medication Dose Route Frequency  Provider Last Rate Last Dose  . 0.9 %  sodium chloride infusion   Intravenous Continuous [name], MD 75 mL/hr at 11/23/18 0600    . acetaminophen (TYLENOL) tablet 650 mg  650 mg Oral Q6H PRN [name], MD       Or  . acetaminophen (TYLENOL) suppository 650 mg  650 mg Rectal Q6H PRN [name], MD      . ALPRAZolam [name]) tablet 0.5 mg  0.5 mg Oral BID [name], MD   0.5 mg at 11/23/18 0840  . cefTRIAXone (ROCEPHIN) 1 g in sodium chloride 0.9 % 100 mL IVPB  1 g Intravenous Q24H [name], MD   Stopped at 11/22/18 2125  . Chlorhexidine Gluconate Cloth 2 % PADS 6 each  6 each Topical Daily [place], Marge Duncans, MD      . divalproex (DEPAKOTE SPRINKLE) capsule 250 mg  250 mg Oral Q12H [name], MD   250 mg at 11/23/18 0841  . enoxaparin (LOVENOX) injection 40 mg  40 mg Subcutaneous QHS [name], MD   40 mg at 11/22/18 2317  . feeding supplement (ENSURE ENLIVE) (ENSURE ENLIVE) liquid 237 mL  237 mL Oral BID BM Darrick Meigs, Marge Duncans, MD      . ondansetron (ZOFRAN) tablet 4 mg  4 mg Oral Q6H PRN [name], MD  Or  . ondansetron (ZOFRAN) injection 4 mg  4 mg Intravenous Q6H PRN ,  S, MD      . traZODone (DEMeredeth IdeSYREL) tablet 100 mg  100 mg Oral QHS Meredeth Ide,  S, MD   100 mg at 11/22/18 2314     Discharge Medications: STOP taking these medications   ketorolac 0.5 % ophthalmic solution Commonly known as: ACULAR   valproic acid 250 MG/5ML Soln solution Commonly known as: DEPAKENE     TAKE these medications   ALPRAZolam 1 MG tablet Commonly known as: XANAX Take 1 tablet (1 mg total) by mouth at bedtime.   ALPRAZolam 0.5 MG tablet Commonly known as: XANAX Take 1 tablet (0.5 mg total) by mouth 2 (two) times daily.   cephALEXin 500 MG capsule Commonly known as: KEFLEX Take 1 capsule (500 mg total) by mouth 2 (two) times daily for 3 days.   divalproex 125 MG capsule Commonly known as: DEPAKOTE SPRINKLE Take 250 mg by mouth 2 (two) times daily.    feeding supplement (GLUCERNA SHAKE) Liqd Take 237 mLs by mouth 3 (three) times daily between meals. Drink 1 shake (237 ml's) by mouth three times a day and with any snacks (CHOCOLATE)   traZODone 100 MG tablet Commonly known as: DESYREL Take 200 mg by mouth at bedtime.    Relevant Imaging Results:  Relevant Lab Results:   Additional Information SS#: [id number]  Coralyn HellingBernette Jones, LCSW

## 2018-11-23 NOTE — Progress Notes (Signed)
Md paged about Ct results. Pt to d/c to facility when everything is arranged.

## 2018-11-25 LAB — CULTURE, BLOOD (ROUTINE X 2)
Culture: NO GROWTH
Culture: NO GROWTH
Special Requests: ADEQUATE
Special Requests: ADEQUATE

## 2018-12-14 ENCOUNTER — Emergency Department (HOSPITAL_COMMUNITY): Payer: BC Managed Care – PPO

## 2018-12-14 ENCOUNTER — Emergency Department (HOSPITAL_COMMUNITY)
Admission: EM | Admit: 2018-12-14 | Discharge: 2018-12-14 | Disposition: A | Discharge status: Home / Self Care | Payer: BC Managed Care – PPO | Attending: Emergency Medicine | Admitting: Emergency Medicine

## 2018-12-14 ENCOUNTER — Other Ambulatory Visit: Payer: Self-pay

## 2018-12-14 ENCOUNTER — Encounter (HOSPITAL_COMMUNITY): Payer: Self-pay | Admitting: Emergency Medicine

## 2018-12-14 DIAGNOSIS — W050XXA Fall from non-moving wheelchair, initial encounter: Secondary | ICD-10-CM | POA: Insufficient documentation

## 2018-12-14 DIAGNOSIS — S0081XA Abrasion of other part of head, initial encounter: Secondary | ICD-10-CM | POA: Insufficient documentation

## 2018-12-14 DIAGNOSIS — Y939 Activity, unspecified: Secondary | ICD-10-CM | POA: Insufficient documentation

## 2018-12-14 DIAGNOSIS — S0990XA Unspecified injury of head, initial encounter: Secondary | ICD-10-CM | POA: Diagnosis present

## 2018-12-14 DIAGNOSIS — F0280 Dementia in other diseases classified elsewhere without behavioral disturbance: Secondary | ICD-10-CM | POA: Diagnosis not present

## 2018-12-14 DIAGNOSIS — W19XXXA Unspecified fall, initial encounter: Secondary | ICD-10-CM

## 2018-12-14 DIAGNOSIS — Y92129 Unspecified place in nursing home as the place of occurrence of the external cause: Secondary | ICD-10-CM | POA: Insufficient documentation

## 2018-12-14 DIAGNOSIS — G309 Alzheimer's disease, unspecified: Secondary | ICD-10-CM | POA: Diagnosis not present

## 2018-12-14 DIAGNOSIS — Y999 Unspecified external cause status: Secondary | ICD-10-CM | POA: Diagnosis not present

## 2018-12-14 DIAGNOSIS — Z79899 Other long term (current) drug therapy: Secondary | ICD-10-CM | POA: Diagnosis not present

## 2018-12-14 NOTE — ED Notes (Addendum)
[employer] called for notification of patient's return, [facility] called for transport. No numbers on file to contact family.

## 2018-12-14 NOTE — Discharge Instructions (Signed)
As discussed, your evaluation today has been largely reassuring.  But, it is important that you monitor your condition carefully, and do not hesitate to return to the ED if you develop new, or concerning changes in your condition. ? ?Otherwise, please follow-up with your physician for appropriate ongoing care. ? ?

## 2018-12-14 NOTE — ED Provider Notes (Signed)
[facility] DEPT Provider Note   CSN: [id number] Arrival date & time: 12/14/18  2003     History   Chief Complaint Chief Complaint  Patient presents with  . Fall    HPI ALPHONSO GREGSON is a 63 y.o. male.     HPI Presents from his facility with concern of a fall.  Patient himself has Alzheimer's, he has baseline noncommunicative, and per report has been no changes to form. Reportedly the patient had a mechanical fall falling onto his head from his wheelchair. He sustained an abrasion to his forehead, but no other reported injuries. On attempts to evaluate the patient himself he is noncommunicative, level 5 caveat secondary to his dementia.   Past Medical History:  Diagnosis Date  . 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 Hyperlipidemia     Patient Active Problem List   Diagnosis Date Noted  . UTI (urinary tract infection) 11/20/2018  . Seizure ([place]) 11/20/2018  . Sepsis ([employer]) 11/20/2018  . Dementia ([place]) 03/21/2017    History reviewed. No pertinent surgical history.      Home Medications    Prior to Admission medications   Medication Sig Start Date End Date Taking? Authorizing Provider  ALPRAZolam [name]) 0.5 MG tablet Take 1 tablet (0.5 mg total) by mouth 2 (two) times daily. 11/23/18   [name], MD  ALPRAZolam [name]) 1 MG tablet Take 1 tablet (1 mg total) by mouth at bedtime. 11/23/18   [name], MD  divalproex (DEPAKOTE SPRINKLE) 125 MG capsule Take 250 mg by mouth 2 (two) times daily.    [provider]  feeding supplement, GLUCERNA SHAKE, (GLUCERNA SHAKE) LIQD Take 237 mLs by mouth 3 (three) times daily between meals. Drink 1 shake (237 ml's) by mouth three times a day and with any snacks (CHOCOLATE)     [provider]  traZODone (DESYREL) 100 MG tablet Take 200 mg by mouth at bedtime.  08/16/18   [provider]    Family History Family History  Problem Relation Age  of Onset  . Stroke Mother   . Diabetes Neg Hx   . Hypertension Neg Hx     Social History Social History   Tobacco Use  . Smoking status: Never Smoker  . Smokeless tobacco: Never Used  Substance Use Topics  . Alcohol use: Never    Frequency: Never  . Drug use: No     Allergies   Patient has no known allergies.   Review of Systems Review of Systems  Unable to perform ROS: Dementia     Physical Exam Updated Vital Signs BP 100/66   Pulse 68   Temp 98.1 F (36.7 C) (Oral)   Resp 16   SpO2 100%   Physical Exam Vitals signs and nursing note reviewed.  Constitutional:      Appearance: He is well-developed. He is ill-appearing.     Comments: Unwell appearing frail adult male minimally interactive  HENT:     Head: Normocephalic.   Eyes:     Conjunctiva/sclera: Conjunctivae normal.  Cardiovascular:     Rate and Rhythm: Normal rate and regular rhythm.  Pulmonary:     Effort: Pulmonary effort is normal. No respiratory distress.     Breath sounds: No stridor.  Abdominal:     General: There is no distension.  Musculoskeletal:        General: No deformity.     Comments: Substantial atrophy  Skin:  General: Skin is warm and dry.  Neurological:     Comments: Moves all extremities minimally, spontaneously, does respond to painful stimuli, also sternal rub.  Patient is essentially nonverbal.  Psychiatric:        Cognition and Memory: Cognition is impaired.      ED Treatments / Results   Radiology No results found.  Procedures Procedures (including critical care time)   Initial Impression / Assessment and Plan / ED Course  I have reviewed the triage vital signs and the nursing notes.  Pertinent labs & imaging results that were available during my care of the patient were reviewed by me and considered in my medical decision making (see chart for details).  This adult male with early onset Alzheimer's presents after mechanical fall at his facility.  Patient is minimally interactive at baseline, and now. Is hemodynamically unremarkable. With concern for head trauma the patient will have CT scan performed. Should the scan returned with unremarkable results, and the patient remains in baseline condition, hemodynamically unremarkable, he will be appropriate for returning to his facility.  Final Clinical Impressions(s) / ED Diagnoses   Final diagnoses:  Fall, initial encounter  Injury of head, initial encounter     Gerhard MunchLockwood, Mikayla Chiusano, MD 12/14/18 2213

## 2018-12-14 NOTE — ED Triage Notes (Signed)
Arrives via EMS from an assisted living facility. Witnessed fall from wheelchair to floor, patient hit head, minor abrasion to forehead. No blood thinners. Baseline demented. Staff endorses generalized weakness x1 day. CBG 175.

## 2018-12-21 ENCOUNTER — Emergency Department (HOSPITAL_COMMUNITY): Payer: BC Managed Care – PPO

## 2018-12-21 ENCOUNTER — Emergency Department (HOSPITAL_COMMUNITY)
Admission: EM | Admit: 2018-12-21 | Discharge: 2018-12-21 | Disposition: A | Discharge status: Home / Self Care | Payer: BC Managed Care – PPO | Attending: Emergency Medicine | Admitting: Emergency Medicine

## 2018-12-21 ENCOUNTER — Other Ambulatory Visit: Payer: Self-pay

## 2018-12-21 ENCOUNTER — Encounter (HOSPITAL_COMMUNITY): Payer: Self-pay | Admitting: *Deleted

## 2018-12-21 DIAGNOSIS — G300 Alzheimer's disease with early onset: Secondary | ICD-10-CM | POA: Insufficient documentation

## 2018-12-21 DIAGNOSIS — R569 Unspecified convulsions: Secondary | ICD-10-CM | POA: Insufficient documentation

## 2018-12-21 DIAGNOSIS — F0280 Dementia in other diseases classified elsewhere without behavioral disturbance: Secondary | ICD-10-CM | POA: Insufficient documentation

## 2018-12-21 DIAGNOSIS — R251 Tremor, unspecified: Secondary | ICD-10-CM

## 2018-12-21 LAB — CBC WITH DIFFERENTIAL/PLATELET
Abs Immature Granulocytes: 0.02 10*3/uL (ref 0.00–0.07)
Basophils Absolute: 0 10*3/uL (ref 0.0–0.1)
Basophils Relative: 1 %
Eosinophils Absolute: 0.2 10*3/uL (ref 0.0–0.5)
Eosinophils Relative: 5 %
HCT: 41 % (ref 39.0–52.0)
Hemoglobin: 13.6 g/dL (ref 13.0–17.0)
Immature Granulocytes: 0 %
Lymphocytes Relative: 20 %
Lymphs Abs: 1 10*3/uL (ref 0.7–4.0)
MCH: 31.1 pg (ref 26.0–34.0)
MCHC: 33.2 g/dL (ref 30.0–36.0)
MCV: 93.6 fL (ref 80.0–100.0)
Monocytes Absolute: 0.3 10*3/uL (ref 0.1–1.0)
Monocytes Relative: 6 %
Neutro Abs: 3.3 10*3/uL (ref 1.7–7.7)
Neutrophils Relative %: 68 %
Platelets: 186 10*3/uL (ref 150–400)
RBC: 4.38 MIL/uL (ref 4.22–5.81)
RDW: 13.1 % (ref 11.5–15.5)
WBC: 4.8 10*3/uL (ref 4.0–10.5)
nRBC: 0 % (ref 0.0–0.2)

## 2018-12-21 LAB — COMPREHENSIVE METABOLIC PANEL
ALT: 16 U/L (ref 0–44)
AST: 23 U/L (ref 15–41)
Albumin: 3.6 g/dL (ref 3.5–5.0)
Alkaline Phosphatase: 47 U/L (ref 38–126)
Anion gap: 10 (ref 5–15)
BUN: 17 mg/dL (ref 8–23)
CO2: 25 mmol/L (ref 22–32)
Calcium: 9.3 mg/dL (ref 8.9–10.3)
Chloride: 102 mmol/L (ref 98–111)
Creatinine, Ser: 1.07 mg/dL (ref 0.61–1.24)
GFR calc Af Amer: >60 mL/min (ref >60)
GFR calc non Af Amer: >60 mL/min (ref >60)
Glucose, Bld: 108 mg/dL — ABNORMAL HIGH (ref 70–99)
Potassium: 4.3 mmol/L (ref 3.5–5.1)
Sodium: 137 mmol/L (ref 135–145)
Total Bilirubin: 0.9 mg/dL (ref 0.3–1.2)
Total Protein: 7.1 g/dL (ref 6.5–8.1)

## 2018-12-21 LAB — VALPROIC ACID LEVEL: Valproic Acid Lvl: 19 ug/mL — ABNORMAL LOW (ref 50.0–100.0)

## 2018-12-21 LAB — CK: Total CK: 93 U/L (ref 49–397)

## 2018-12-21 LAB — LACTIC ACID, PLASMA: Lactic Acid, Venous: 2 mmol/L (ref 0.5–1.9)

## 2018-12-21 MED ORDER — DIVALPROEX SODIUM 125 MG PO CSDR: 500.0000 mg | DELAYED_RELEASE_CAPSULE | Freq: Two times a day (BID) | ORAL | 0 refills | Status: DC

## 2018-12-21 NOTE — Discharge Instructions (Signed)
Increase depakote to 500 mg twice daily Have level checked and recheck with his neurologist next week Return to ED if any worsening or decreased responsiveness

## 2018-12-21 NOTE — ED Provider Notes (Signed)
[facility] EMERGENCY DEPARTMENT Provider Note   CSN: [id number] Arrival date & time: 12/21/18  2992    History   Chief Complaint Chief Complaint  Patient presents with  . Seizures    HPI Kenneth Mcdaniel is a 63 y.o. male.   The history is provided by the EMS personnel. The history is limited by the condition of the patient (Dementia).  He has history of hyperlipidemia, dementia, seizure, urinary tract infections and is brought in by ambulance following report of his seizure.  He is currently taking valproic acid which is mainly for mood stabilization and not specifically as an anticonvulsant.  Patient is not able to give any history whatsoever.  This is apparently his baseline.  Past Medical History:  Diagnosis Date  . Early onset Alzheimer's dementia ([place])    from [facility]  . Hyperlipidemia     Patient Active Problem List   Diagnosis Date Noted  . UTI (urinary tract infection) 11/20/2018  . Seizure ([place]) 11/20/2018  . Sepsis ([place]) 11/20/2018  . Dementia ([place]) 03/21/2017    No past surgical history on file.      Home Medications    Prior to Admission medications   Medication Sig Start Date End Date Taking? Authorizing Provider  ALPRAZolam [name]) 0.5 MG tablet Take 1 tablet (0.5 mg total) by mouth 2 (two) times daily. 11/23/18   [name], MD  ALPRAZolam [name]) 1 MG tablet Take 1 tablet (1 mg total) by mouth at bedtime. 11/23/18   [name], MD  divalproex (DEPAKOTE SPRINKLE) 125 MG capsule Take 250 mg by mouth 2 (two) times daily.    [provider]  feeding supplement, GLUCERNA SHAKE, (GLUCERNA SHAKE) LIQD Take 237 mLs by mouth 3 (three) times daily between meals. Drink 1 shake (237 ml's) by mouth three times a day and with any snacks (CHOCOLATE)     [provider]  traZODone (DESYREL) 100 MG tablet Take 200 mg by mouth at bedtime.  08/16/18   [provider]    Family History Family History  Problem  Relation Age of Onset  . Stroke Mother   . Diabetes Neg Hx   . Hypertension Neg Hx     Social History Social History   Tobacco Use  . Smoking status: Never Smoker  . Smokeless tobacco: Never Used  Substance Use Topics  . Alcohol use: Never    Frequency: Never  . Drug use: No     Allergies   Patient has no known allergies.   Review of Systems Review of Systems  Unable to perform ROS: Dementia     Physical Exam Updated Vital Signs BP (!) 135/100 (BP Location: Right Arm)   Pulse 72   Temp 97.7 F (36.5 C) (Oral)   Resp 19   Ht 5\' 6"  (1.676 m)   Wt 58 kg   SpO2 97%   BMI 20.64 kg/m   Physical Exam Vitals signs and nursing note reviewed.    63 year old male, resting comfortably and in no acute distress. Vital signs are significant for elevated blood pressure. Oxygen saturation is 97%, which is normal. Head is normocephalic and atraumatic. PERRLA, EOMI. Oropharynx is clear. Neck is nontender and supple without adenopathy or JVD. Back is nontender and there is no CVA tenderness. Lungs are clear without rales, wheezes, or rhonchi. Chest is nontender. Heart has regular rate and rhythm without murmur. Abdomen is soft, flat, nontender without masses or hepatosplenomegaly and peristalsis is  normoactive. Extremities have no cyanosis or edema, full range of motion is present. Skin is warm and dry without rash. Neurologic: Awake and sometimes repeats words spoken to him but is not able to hold meaningful conversation.  Cranial nerves are grossly intact.  He moves all extremities equally.  ED Treatments / Results  Labs (all labs ordered are listed, but only abnormal results are displayed) Labs Reviewed  COMPREHENSIVE METABOLIC PANEL - Abnormal; Notable for the following components:      Result Value   Glucose, Bld 108 (*)    All other components within normal limits  LACTIC ACID, PLASMA - Abnormal; Notable for the following components:   Lactic Acid, Venous 2.0 (*)     All other components within normal limits  VALPROIC ACID LEVEL - Abnormal; Notable for the following components:   Valproic Acid Lvl 19 (*)    All other components within normal limits  CBC WITH DIFFERENTIAL/PLATELET  CK  LACTIC ACID, PLASMA  URINALYSIS, ROUTINE W REFLEX MICROSCOPIC    Radiology Ct Head Wo Contrast  Result Date: 12/21/2018 CLINICAL DATA:  New nontraumatic seizure. EXAM: CT HEAD WITHOUT CONTRAST TECHNIQUE: Contiguous axial images were obtained from the base of the skull through the vertex without intravenous contrast. COMPARISON:  12/14/2018 FINDINGS: Brain: No evidence of acute infarction, hemorrhage, extra-axial collection, or mass lesion/mass effect. Diffuse cerebral atrophy and moderate to severe ventriculomegaly show no significant change. Vascular:  No hyperdense vessel or other acute findings. Skull: No evidence of fracture or other significant bone abnormality. Sinuses/Orbits: No acute findings. Partial opacification of ethmoid sinuses again seen bilaterally. Other: None. IMPRESSION: No acute intracranial abnormality. Stable diffuse cerebral atrophy and ventriculomegaly. Chronic ethmoid sinus disease.8 Electronically Signed   By: Danae OrleansJohn A Stahl M.D.   On: 12/21/2018 07:22    Procedures Procedures  Medications Ordered in ED Medications - No data to display   Initial Impression / Assessment and Plan / ED Course  I have reviewed the triage vital signs and the nursing notes.  Pertinent labs & imaging results that were available during my care of the patient were reviewed by me and considered in my medical decision making (see chart for details).  Apparent seizure.  Will check electrolytes and valproic acid level.  Old records are reviewed, and he was seen in June with seizure-like activity and it was elected to continue the valproic acid as an anticonvulsant.  It was never clearly established that he did have a seizure.  However, he has had numerous ED visits for falls  including one on August 29.  Will check electrolytes and valproic acid level and sent for CT of head.  Will also check lactic acid level and CK as they would be expected to be abnormal with recent seizure.  CT of the head shows no acute process.  CK is normal and lactic acid is minimally elevated.  No indication that he truly had a seizure.  Valproic acid level is subtherapeutic.  Case is signed out to Dr. Rosalia Hammersay to discuss management with neurology.  Final Clinical Impressions(s) / ED Diagnoses   Final diagnoses:  Episode of shaking    ED Discharge Orders    None       Dione BoozeGlick, Lluvia Gwynne, MD 12/21/18 [phone]

## 2018-12-21 NOTE — ED Notes (Signed)
HELP CLEAN PATIENT UP NOW PATIENT IS BACK IN BED ON THE MONITOR CALL BELL IN REACH

## 2018-12-21 NOTE — ED Notes (Addendum)
Pt waiting for [facility], this RN attempted report to [facility] x 2 with no answer. Pt given prescription for depakote

## 2018-12-21 NOTE — ED Triage Notes (Signed)
The pt arrived by [facility] from [facility].  Called there by staff because  The pt has tremors while standing with staff  No post ictal phase x2  Pt is non-verbal opens eyes and pulls away  When  Staff attempts any exam or maneuver  He is a n0 -code  The nursing facility has contacted family  According to ems

## 2018-12-21 NOTE — ED Provider Notes (Signed)
63 year old male with early onset dementia presents today with shaking episode.  Report received from Dr. Roxanne Mins.  Patient had some isolated extremity shaking but did not have any post ictal reported.  And here appears to be at his baseline.  Patient is on chronic acid for "mood stabilization". Care discussed with Dr. Leonel Ramsay.  Plan increase Depakote to 500 mg twice daily.  He will need levels checked and follow-up with his neurologist next week. Patient appears stable here in ED with normal labs stable heart rate, blood pressure, and afebrile.   [name], MD 12/21/18 [phone]

## 2018-12-30 ENCOUNTER — Encounter (HOSPITAL_COMMUNITY): Payer: Self-pay

## 2018-12-30 ENCOUNTER — Emergency Department (HOSPITAL_COMMUNITY)
Admission: EM | Admit: 2018-12-30 | Discharge: 2018-12-30 | Disposition: A | Discharge status: Home / Self Care | Payer: BC Managed Care – PPO | Attending: Emergency Medicine | Admitting: Emergency Medicine

## 2018-12-30 ENCOUNTER — Other Ambulatory Visit: Payer: Self-pay

## 2018-12-30 ENCOUNTER — Emergency Department (HOSPITAL_COMMUNITY): Payer: BC Managed Care – PPO

## 2018-12-30 DIAGNOSIS — Y92129 Unspecified place in nursing home as the place of occurrence of the external cause: Secondary | ICD-10-CM | POA: Diagnosis not present

## 2018-12-30 DIAGNOSIS — R569 Unspecified convulsions: Secondary | ICD-10-CM | POA: Insufficient documentation

## 2018-12-30 DIAGNOSIS — G300 Alzheimer's disease with early onset: Secondary | ICD-10-CM | POA: Insufficient documentation

## 2018-12-30 DIAGNOSIS — Y939 Activity, unspecified: Secondary | ICD-10-CM | POA: Insufficient documentation

## 2018-12-30 DIAGNOSIS — Z79899 Other long term (current) drug therapy: Secondary | ICD-10-CM | POA: Diagnosis not present

## 2018-12-30 DIAGNOSIS — Y999 Unspecified external cause status: Secondary | ICD-10-CM | POA: Diagnosis not present

## 2018-12-30 DIAGNOSIS — W1830XA Fall on same level, unspecified, initial encounter: Secondary | ICD-10-CM | POA: Diagnosis not present

## 2018-12-30 DIAGNOSIS — W19XXXA Unspecified fall, initial encounter: Secondary | ICD-10-CM

## 2018-12-30 DIAGNOSIS — S0083XA Contusion of other part of head, initial encounter: Secondary | ICD-10-CM | POA: Insufficient documentation

## 2018-12-30 LAB — COMPREHENSIVE METABOLIC PANEL
ALT: 13 U/L (ref 0–44)
AST: 23 U/L (ref 15–41)
Albumin: 3.6 g/dL (ref 3.5–5.0)
Alkaline Phosphatase: 51 U/L (ref 38–126)
Anion gap: 11 (ref 5–15)
BUN: 15 mg/dL (ref 8–23)
CO2: 26 mmol/L (ref 22–32)
Calcium: 9.1 mg/dL (ref 8.9–10.3)
Chloride: 102 mmol/L (ref 98–111)
Creatinine, Ser: 0.9 mg/dL (ref 0.61–1.24)
GFR calc Af Amer: >60 mL/min (ref >60)
GFR calc non Af Amer: >60 mL/min (ref >60)
Glucose, Bld: 109 mg/dL — ABNORMAL HIGH (ref 70–99)
Potassium: 3.9 mmol/L (ref 3.5–5.1)
Sodium: 139 mmol/L (ref 135–145)
Total Bilirubin: 0.9 mg/dL (ref 0.3–1.2)
Total Protein: 7 g/dL (ref 6.5–8.1)

## 2018-12-30 LAB — CBC WITH DIFFERENTIAL/PLATELET
Abs Immature Granulocytes: 0.04 10*3/uL (ref 0.00–0.07)
Basophils Absolute: 0 10*3/uL (ref 0.0–0.1)
Basophils Relative: 1 %
Eosinophils Absolute: 0.2 10*3/uL (ref 0.0–0.5)
Eosinophils Relative: 2 %
HCT: 44.9 % (ref 39.0–52.0)
Hemoglobin: 14.7 g/dL (ref 13.0–17.0)
Immature Granulocytes: 1 %
Lymphocytes Relative: 22 %
Lymphs Abs: 1.8 10*3/uL (ref 0.7–4.0)
MCH: 30.9 pg (ref 26.0–34.0)
MCHC: 32.7 g/dL (ref 30.0–36.0)
MCV: 94.3 fL (ref 80.0–100.0)
Monocytes Absolute: 0.4 10*3/uL (ref 0.1–1.0)
Monocytes Relative: 5 %
Neutro Abs: 5.7 10*3/uL (ref 1.7–7.7)
Neutrophils Relative %: 69 %
Platelets: 286 10*3/uL (ref 150–400)
RBC: 4.76 MIL/uL (ref 4.22–5.81)
RDW: 13.1 % (ref 11.5–15.5)
WBC: 8.1 10*3/uL (ref 4.0–10.5)
nRBC: 0 % (ref 0.0–0.2)

## 2018-12-30 LAB — VALPROIC ACID LEVEL: Valproic Acid Lvl: 51 ug/mL (ref 50.0–100.0)

## 2018-12-30 MED ORDER — VALPROATE SODIUM 500 MG/5ML IV SOLN
500.0000 mg | Freq: Once | INTRAVENOUS | Status: AC
  Administered 2018-12-30: 11:00:00 500 mg via INTRAVENOUS
  Filled 2018-12-30: qty 5

## 2018-12-30 NOTE — ED Provider Notes (Signed)
Patient is a 63 year old demented male, level 5 caveat applies, had a witnessed seizure episode prior to arrival, it is not clear whether this happened after the fall and head injury or prior to causing it.  He does have a boggy hematoma to the posterior occiput, he appears to not be well cared forward as is evidenced by black caked buildup on the bottom of his feet.  He otherwise is in no distress at this time, no active seizures, is able to follow occasional commands, does not have any changes in his pupils, soft abdomen, clear heart and lungs without tachycardia.  No active seizures.  Blood sugar was reportedly normal.  Needs imaging to rule out intracranial injury given this head injury with the fall.  Will need to evaluate for appropriate medication dosing at the facility, no other acute findings at this time.  I discussed this case with neurology, they recommended Depakote level, increasing if the level is less than 20 since his last dose was at 8:00 last night otherwise keeping at the same if it is greater than 20.  Medical screening examination/treatment/procedure(s) were conducted as a shared visit with non-physician practitioner(s) and myself.  I personally evaluated the patient during the encounter.  Clinical Impression:   Final diagnoses:  Fall, initial encounter  Seizure [facility])         Noemi Chapel, MD 12/31/18 [phone]

## 2018-12-30 NOTE — Progress Notes (Signed)
CSW received call from patient's RN Zelphia Cairo regarding her concerns about the patient's feet being dirty. CSW spoke with Mare Loan at [facility] to discuss concerns. Latoya reports that the patient refuses to wear socks at the facility and that the patient has shoes that he can wear but they give him blisters so he prefers to go barefoot all the time. Latoya reports the patient is pleasant and there are no concerns for his safety at the facility.  CSW notified RN of information. No APS report will be made.  Madilyn Fireman, MSW, LCSW-A Clinical Social Worker [facility] [phone]

## 2018-12-30 NOTE — ED Triage Notes (Signed)
Paitent BIB [facility] from [facility] for witnessed seizure. Staff reports pt did hit his head. Hx of seizures and dementia. Pt prescribed Depakote for seizures. Hematoma to back of head and small contusion to right elbow. Patient returned to baseline per [facility] staff.

## 2018-12-30 NOTE — ED Provider Notes (Signed)
[facility] EMERGENCY DEPARTMENT Provider Note   CSN: [id number] Arrival date & time: 12/30/18  1443     History   Chief Complaint Chief Complaint  Patient presents with   Seizures    HPI Kenneth Mcdaniel is a 63 y.o. male.     63 y.o male with a PMH of early onset Alz Dementia, hyperlipidemia presents to the ED s/p fall. According to staff, patient had a witness fall striking his head on the floor along with a witnessed seizure event, unknown type of seizure or length of the event. Level 5 Caveat.   The history is provided by the nursing home and the EMS personnel.    Past Medical History:  Diagnosis Date   Early onset Alzheimer's dementia ([place])    from [facility]   Hyperlipidemia     Patient Active Problem List   Diagnosis Date Noted   UTI (urinary tract infection) 11/20/2018   Seizure ([place]) 11/20/2018   Sepsis ([place]) 11/20/2018   Dementia ([place]) 03/21/2017    History reviewed. No pertinent surgical history.      Home Medications    Prior to Admission medications   Medication Sig Start Date End Date Taking? Authorizing Provider  ALPRAZolam [name]) 0.5 MG tablet Take 1 tablet (0.5 mg total) by mouth 2 (two) times daily. 11/23/18  Yes Darrick Meigs, Marge Duncans, MD  ALPRAZolam [name]) 1 MG tablet Take 1 tablet (1 mg total) by mouth at bedtime. 11/23/18  Yes [name], MD  divalproex (DEPAKOTE SPRINKLE) 125 MG capsule Take 4 capsules (500 mg total) by mouth 2 (two) times daily. 12/21/18  Yes [name], MD  feeding supplement, GLUCERNA SHAKE, (GLUCERNA SHAKE) LIQD Take 237 mLs by mouth 3 (three) times daily between meals. Drink 1 shake (237 ml's) by mouth three times a day and with any snacks (CHOCOLATE)    Yes [provider]  mirtazapine (REMERON) 15 MG tablet Take 15 mg by mouth at bedtime.   Yes [provider]  traZODone (DESYREL) 100 MG tablet Take 100 mg by mouth at bedtime.  08/16/18  Yes [provider]    Family  History Family History  Problem Relation Age of Onset   Stroke Mother    Diabetes Neg Hx    Hypertension Neg Hx     Social History Social History   Tobacco Use   Smoking status: Never Smoker   Smokeless tobacco: Never Used  Substance Use Topics   Alcohol use: Never    Frequency: Never   Drug use: No     Allergies   Patient has no known allergies.   Review of Systems Review of Systems  Unable to perform ROS: Dementia  Neurological: Positive for seizures.     Physical Exam Updated Vital Signs BP (!) 145/128    Pulse 68    Temp (!) 96.5 F (35.8 C) (Axillary)    Resp (!) 27    SpO2 100%   Physical Exam Vitals signs and nursing note reviewed.  Constitutional:      Comments: Thin chronically ill appearing male.   HENT:     Head: Normocephalic.     Comments: Goose egg noted to occipital region.     Nose: No rhinorrhea.     Mouth/Throat:     Mouth: Mucous membranes are dry.  Eyes:     Pupils: Pupils are equal, round, and reactive to light.     Comments: Pupils are equal and reactive.   Neck:  Comments: On Aspen C collar.  Cardiovascular:     Rate and Rhythm: Normal rate.  Pulmonary:     Effort: Pulmonary effort is normal.     Breath sounds: Normal breath sounds. No wheezing, rhonchi or rales.  Abdominal:     General: Abdomen is flat. There is no distension.     Tenderness: There is no abdominal tenderness.     Comments: Normal bowel sounds, no tenderness on my exam.   Skin:    General: Skin is warm and dry.  Neurological:     Mental Status: Mental status is at baseline.     Comments: At baseline per nursing facility.       ED Treatments / Results  Labs (all labs ordered are listed, but only abnormal results are displayed) Labs Reviewed  COMPREHENSIVE METABOLIC PANEL - Abnormal; Notable for the following components:      Result Value   Glucose, Bld 109 (*)    All other components within normal limits  CBC WITH DIFFERENTIAL/PLATELET    VALPROIC ACID LEVEL  URINALYSIS, ROUTINE W REFLEX MICROSCOPIC  VALPROIC ACID LEVEL    EKG EKG Interpretation  Date/Time:  Monday December 30 2018 08:28:41 EDT Ventricular Rate:  68 PR Interval:    QRS Duration: 104 QT Interval:  384 QTC Calculation: 409 R Axis:   86 Text Interpretation:  Sinus rhythm Short PR interval Probable inferior infarct, old Probable anterolateral infarct, old since 11/20/18, rate slowed, no other changes Confirmed by Eber HongMiller, Brian ([id number]) on 12/30/2018 8:33:47 AM   Radiology Ct Head Wo Contrast  Result Date: 12/30/2018 CLINICAL DATA:  Seizure EXAM: CT HEAD WITHOUT CONTRAST CT CERVICAL SPINE WITHOUT CONTRAST TECHNIQUE: Multidetector CT imaging of the head and cervical spine was performed following the standard protocol without intravenous contrast. Multiplanar CT image reconstructions of the cervical spine were also generated. COMPARISON:  12/21/2018 FINDINGS: CT HEAD FINDINGS Brain: None There is atrophy and chronic small vessel disease changes. Stable ventriculomegaly likely related to expected dilatation. No acute intracranial abnormality. Specifically, no hemorrhage, acute infarction or mass lesion. Vascular: No hyperdense vessel or unexpected calcification. Skull: No acute calvarial abnormality. Sinuses/Orbits: No acute finding Other: None CT CERVICAL SPINE FINDINGS Alignment: Normal Skull base and vertebrae: No acute fracture. No primary bone lesion or focal pathologic process. Soft tissues and spinal canal: No prevertebral fluid or swelling. No visible canal hematoma. Disc levels: Diffuse degenerative disc changes with disc space narrowing and spurring. Moderate diffuse bilateral degenerative facet disease. Upper chest: No acute findings Other: Carotid artery calcifications. IMPRESSION: Atrophy, chronic microvascular disease. No acute intracranial abnormality. Cervical spondylosis.  No acute bony abnormality. Electronically Signed   By: Charlett NoseKevin  Dover M.D.   On:  12/30/2018 09:12   Ct Cervical Spine Wo Contrast  Result Date: 12/30/2018 CLINICAL DATA:  Seizure EXAM: CT HEAD WITHOUT CONTRAST CT CERVICAL SPINE WITHOUT CONTRAST TECHNIQUE: Multidetector CT imaging of the head and cervical spine was performed following the standard protocol without intravenous contrast. Multiplanar CT image reconstructions of the cervical spine were also generated. COMPARISON:  12/21/2018 FINDINGS: CT HEAD FINDINGS Brain: None There is atrophy and chronic small vessel disease changes. Stable ventriculomegaly likely related to expected dilatation. No acute intracranial abnormality. Specifically, no hemorrhage, acute infarction or mass lesion. Vascular: No hyperdense vessel or unexpected calcification. Skull: No acute calvarial abnormality. Sinuses/Orbits: No acute finding Other: None CT CERVICAL SPINE FINDINGS Alignment: Normal Skull base and vertebrae: No acute fracture. No primary bone lesion or focal pathologic process. Soft tissues and spinal  canal: No prevertebral fluid or swelling. No visible canal hematoma. Disc levels: Diffuse degenerative disc changes with disc space narrowing and spurring. Moderate diffuse bilateral degenerative facet disease. Upper chest: No acute findings Other: Carotid artery calcifications. IMPRESSION: Atrophy, chronic microvascular disease. No acute intracranial abnormality. Cervical spondylosis.  No acute bony abnormality. Electronically Signed   By: Charlett Nose M.D.   On: 12/30/2018 09:12    Procedures Procedures (including critical care time)  Medications Ordered in ED Medications  valproate (DEPACON) 500 mg in dextrose 5 % 50 mL IVPB (0 mg Intravenous Stopped 12/30/18 1146)     Initial Impression / Assessment and Plan / ED Course  I have reviewed the triage vital signs and the nursing notes.  Pertinent labs & imaging results that were available during my care of the patient were reviewed by me and considered in my medical decision making (see  chart for details).       Patient with a past medical history of early onset dementia from nursing home presents to the ED via EMS status post seizure/fall.  According to nursing staff patient either had a seizure and fell or fell and then had a seizure.  According to nursing staff patient is at baseline right now. Patient does appear disheveled, does have a goose egg to the occipital region of his head, lungs are clear to auscultation, abdomen is soft and nontender.  He is able to follow commands intermittentl.  Pupils are equal and reactive.  Patient does have a prior visit on December 21, 2018 for the same incident, did have a CT head within normal limits, he also had a medication change by neurology with Depakote 500 twice daily instead of 250 twice daily, according to his records which have extensively review it appears the patient is currently taking 200 mg of Depakote twice daily.  Will obtain valproate acid level.  We will also obtain CT head, CT C-spine as patient does have some evidence of trauma trauma.  We will also place call for nursing home in order to obtain some collateral information of the event today.  CT Head/Cervical spine: Atrophy, chronic microvascular disease.    No acute intracranial abnormality.    Cervical spondylosis. No acute bony abnormality.   Labs were obtained in order to check patient's metabolic functions, CMP within normal limits.  LFTs are unremarkable.  CBC without any leukocytosis, hemoglobin is unremarkable.  Patient is currently not on any blood thinners.  Head CT is unremarkable, will place call to nursing facility.  Patient removed from c-collar by me.   9:56 AM Called place to [facility], spoke to [facility] who reports fall was witnessed, patient did not have any [place] seizure-like event, he was "shaking on the floor for 2 minutes ", did not have any medication for his seizure.  According to her, patient is currently  taking Depakote 500 twice daily, last dose was last night around 8 PM.  10:00 AM spoke to pharmacist Fleet Contras, will now load patient with Depakote 500 IV prior to returning to facility.  UA is currently pending, patient does not have a steady gait with ambulation, will not be waiting on UA which is pending at this time.  He likely will return to the facility after receiving loading dose of Depakote.  11:58 AM patient received loading dose of Depakote, Depakote level is within normal limits today at 51.  We will have him follow-up with neurology as needed, will provide neurology follow-up on  patient's discharge papers as the wife reports she has not been able to have neurology see him since.  Patient otherwise stable for discharge.   Portions of this note were generated with Scientist, clinical (histocompatibility and immunogenetics)Dragon dictation software. Dictation errors may occur despite best attempts at proofreading.  Final Clinical Impressions(s) / ED Diagnoses   Final diagnoses:  Fall, initial encounter  Seizure [facility])    ED Discharge Orders    None       Claude MangesSoto, Ludwika Rodd, PA-C 12/30/18 1200    Eber HongMiller, Brian, MD 12/31/18 [phone]

## 2018-12-30 NOTE — Discharge Instructions (Addendum)
The CT head along with CT neck was within normal limits today.  Your laboratory results were unremarkable.  You received a dose of Depakote IV while in the emergency department, you may take your Depakote tonight.

## 2019-02-06 ENCOUNTER — Encounter

## 2019-02-06 ENCOUNTER — Ambulatory Visit: Payer: BC Managed Care – PPO | Admitting: Neurology

## 2019-03-03 ENCOUNTER — Emergency Department (HOSPITAL_COMMUNITY): Payer: BC Managed Care – PPO

## 2019-03-03 ENCOUNTER — Emergency Department (HOSPITAL_COMMUNITY)
Admission: EM | Admit: 2019-03-03 | Discharge: 2019-03-03 | Disposition: A | Discharge status: Home / Self Care | Payer: BC Managed Care – PPO | Attending: Emergency Medicine | Admitting: Emergency Medicine

## 2019-03-03 ENCOUNTER — Encounter (HOSPITAL_COMMUNITY): Payer: Self-pay | Admitting: *Deleted

## 2019-03-03 ENCOUNTER — Other Ambulatory Visit: Payer: Self-pay

## 2019-03-03 DIAGNOSIS — Z79899 Other long term (current) drug therapy: Secondary | ICD-10-CM | POA: Diagnosis not present

## 2019-03-03 DIAGNOSIS — G300 Alzheimer's disease with early onset: Secondary | ICD-10-CM | POA: Diagnosis not present

## 2019-03-03 DIAGNOSIS — R519 Headache, unspecified: Secondary | ICD-10-CM | POA: Insufficient documentation

## 2019-03-03 DIAGNOSIS — F0280 Dementia in other diseases classified elsewhere without behavioral disturbance: Secondary | ICD-10-CM | POA: Insufficient documentation

## 2019-03-03 DIAGNOSIS — W19XXXA Unspecified fall, initial encounter: Secondary | ICD-10-CM

## 2019-03-03 HISTORY — DX: Restlessness and agitation: R45.1

## 2019-03-03 HISTORY — DX: Peripheral vascular disease, unspecified: I73.9

## 2019-03-03 NOTE — ED Notes (Signed)
Spoke with [facility] to arrange for transportation.

## 2019-03-03 NOTE — ED Notes (Signed)
Spoke with Minette Brine, Med Tech at [facility] and gave report for patient returning.

## 2019-03-03 NOTE — Discharge Instructions (Signed)
Your imaging today showed possible old rib fracture but as you are not tender we have low suspicion this is new.  No head and neck injury seen.  Given your lack of preceding symptoms now we feel you are safe for discharge home.  Please be careful not to fall.

## 2019-03-03 NOTE — ED Triage Notes (Signed)
Pt from [facility], he was using BSC as walker and tripped over it. Unwitnessed, fall, C Collar in place, unknown LOC or head injury. No s/s of head injury. 120/72-95% 65 CBG 112

## 2019-03-03 NOTE — ED Provider Notes (Signed)
[facility] DEPT Provider Note   CSN: [id number] Arrival date & time: 03/03/19  1145     History   Chief Complaint Chief Complaint  Patient presents with   Fall    HPI Kenneth Mcdaniel is a 63 y.o. male.     The history is provided by the patient and medical records. No language interpreter was used.  Fall This is a recurrent problem. The current episode started less than 1 hour ago. The problem occurs rarely. The problem has been resolved. Associated symptoms include headaches. Pertinent negatives include no chest pain, no abdominal pain and no shortness of breath. Nothing aggravates the symptoms. Nothing relieves the symptoms. He has tried nothing for the symptoms. The treatment provided no relief.    Past Medical History:  Diagnosis Date   Agitation    Early onset Alzheimer's dementia ([place])    from [facility]   Hyperlipidemia    PVD (peripheral vascular disease) [facility])     Patient Active Problem List   Diagnosis Date Noted   UTI (urinary tract infection) 11/20/2018   Seizure ([place]) 11/20/2018   Sepsis ([place]) 11/20/2018   Dementia ([place]) 03/21/2017    History reviewed. No pertinent surgical history.      Home Medications    Prior to Admission medications   Medication Sig Start Date End Date Taking? Authorizing Provider  ALPRAZolam [name]) 0.5 MG tablet Take 1 tablet (0.5 mg total) by mouth 2 (two) times daily. 11/23/18   [name], MD  ALPRAZolam [name]) 1 MG tablet Take 1 tablet (1 mg total) by mouth at bedtime. 11/23/18   [name], MD  divalproex (DEPAKOTE SPRINKLE) 125 MG capsule Take 4 capsules (500 mg total) by mouth 2 (two) times daily. 12/21/18   [name], MD  feeding supplement, GLUCERNA SHAKE, (GLUCERNA SHAKE) LIQD Take 237 mLs by mouth 3 (three) times daily between meals. Drink 1 shake (237 ml's) by mouth three times a day and with any snacks (CHOCOLATE)     [provider]  mirtazapine  (REMERON) 15 MG tablet Take 15 mg by mouth at bedtime.    [provider]  traZODone (DESYREL) 100 MG tablet Take 100 mg by mouth at bedtime.  08/16/18   [provider]    Family History Family History  Problem Relation Age of Onset   Stroke Mother    Diabetes Neg Hx    Hypertension Neg Hx     Social History Social History   Tobacco Use   Smoking status: Never Smoker   Smokeless tobacco: Never Used  Substance Use Topics   Alcohol use: Never    Frequency: Never   Drug use: No     Allergies   Patient has no known allergies.   Review of Systems Review of Systems  Constitutional: Negative for chills, diaphoresis, fatigue and fever.  HENT: Negative for congestion and ear pain.   Eyes: Negative for pain and visual disturbance.  Respiratory: Negative for cough, chest tightness, shortness of breath, wheezing and stridor.   Cardiovascular: Negative for chest pain, palpitations and leg swelling.  Gastrointestinal: Negative for abdominal pain, constipation, diarrhea, nausea and vomiting.  Genitourinary: Negative for dysuria and hematuria.  Musculoskeletal: Positive for neck pain. Negative for back pain and neck stiffness.  Skin: Negative for color change, rash and wound.  Neurological: Positive for headaches. Negative for seizures and syncope.  Psychiatric/Behavioral: Positive for confusion.  All other systems reviewed and are negative.    Physical  Exam Updated Vital Signs BP 122/74    Pulse (!) 51    Resp 14    SpO2 100%   Physical Exam Vitals signs and nursing note reviewed.  Constitutional:      General: He is not in acute distress.    Appearance: He is well-developed. He is not ill-appearing or toxic-appearing.  HENT:     Head: Normocephalic and atraumatic.     Nose: Nose normal. No congestion or rhinorrhea.     Mouth/Throat:     Mouth: Mucous membranes are moist.     Pharynx: Oropharynx is clear. No oropharyngeal exudate or posterior  oropharyngeal erythema.  Eyes:     Extraocular Movements: Extraocular movements intact.     Conjunctiva/sclera: Conjunctivae normal.     Pupils: Pupils are equal, round, and reactive to light.  Neck:     Musculoskeletal: Neck supple. No muscular tenderness.   Cardiovascular:     Rate and Rhythm: Normal rate and regular rhythm.     Pulses: Normal pulses.     Heart sounds: No murmur.  Pulmonary:     Effort: Pulmonary effort is normal. No respiratory distress.     Breath sounds: Normal breath sounds.  Abdominal:     General: Abdomen is flat.     Palpations: Abdomen is soft.     Tenderness: There is no abdominal tenderness. There is no right CVA tenderness or left CVA tenderness.  Musculoskeletal:        General: Tenderness present.  Skin:    General: Skin is warm and dry.  Neurological:     Mental Status: He is alert. Mental status is at baseline. He is disoriented.     Cranial Nerves: No dysarthria.     Sensory: No sensory deficit.     Motor: No weakness, tremor, abnormal muscle tone or seizure activity.     Comments: Patient is disoriented but per documentation appears to be somewhat baseline.  Psychiatric:        Mood and Affect: Mood normal.      ED Treatments / Results  Labs (all labs ordered are listed, but only abnormal results are displayed) Labs Reviewed - No data to display  EKG None  Radiology Dg Chest 2 View  Result Date: 03/03/2019 CLINICAL DATA:  Fall EXAM: CHEST - 2 VIEW COMPARISON:  Radiograph November 20, 2018 FINDINGS: No consolidation, features of edema, pneumothorax, or effusion. Pulmonary vascularity is normally distributed. The cardiomediastinal contours are unremarkable. Subacute appearing fracture deformity of the anterolateral left fifth rib is new since November 20, 2018 comparison. No other acute osseous abnormality. IMPRESSION: Subacute appearing fracture deformity of the anterolateral left fifth rib is new since November 20, 2018 comparison. Correlate  for point tenderness. No pneumothorax or effusion. No acute cardiopulmonary abnormality. Electronically Signed   By: Kreg Shropshire M.D.   On: 03/03/2019 14:16   Ct Head Wo Contrast  Result Date: 03/03/2019 CLINICAL DATA:  Larey Seat.  Hit head. EXAM: CT HEAD WITHOUT CONTRAST CT CERVICAL SPINE WITHOUT CONTRAST TECHNIQUE: Multidetector CT imaging of the head and cervical spine was performed following the standard protocol without intravenous contrast. Multiplanar CT image reconstructions of the cervical spine were also generated. COMPARISON:  Head CT 12/30/2018 FINDINGS: CT HEAD FINDINGS Brain: Stable appearing age advanced cerebral atrophy, ventriculomegaly and periventricular white matter disease. The degree of ventriculomegaly may be slightly out of proportion to the degree of cerebral atrophy and fourth ventricle appears relatively normal. Could not exclude some component of none communicating hydrocephalus. No  extra-axial fluid collections are identified. No CT findings for acute hemispheric infarction or intracranial hemorrhage. No mass lesions. The brainstem and cerebellum are normal. Vascular: No hyperdense vessels or obvious aneurysm. Skull: No skull fracture or bone lesion. Sinuses/Orbits: Scattered ethmoid sinus disease. The other paranasal sinuses are clear and the mastoid air cells and middle ear cavities are clear. The globes are intact. Other: No scalp lesions or scalp hematoma. CT CERVICAL SPINE FINDINGS Alignment: Normal Skull base and vertebrae: No acute fracture. No primary bone lesion or focal pathologic process. Soft tissues and spinal canal: No prevertebral fluid or swelling. No visible canal hematoma. Disc levels: The spinal canal is fairly generous. No significant spinal or foraminal stenosis. Upper chest: The visualized lung apices are grossly clear. Other: No significant findings in the neck or upper mediastinum. IMPRESSION: 1. Stable CT appearance of the brain compared to multiple previous  studies. Significant age advanced cerebral atrophy, ventriculomegaly and periventricular white matter disease. 2. No acute intracranial findings or skull fracture. 3. Normal alignment of the cervical vertebral bodies and no acute cervical spine fracture. Electronically Signed   By: Rudie MeyerP.  Gallerani M.D.   On: 03/03/2019 14:18   Ct Cervical Spine Wo Contrast  Result Date: 03/03/2019 CLINICAL DATA:  Larey SeatFell.  Hit head. EXAM: CT HEAD WITHOUT CONTRAST CT CERVICAL SPINE WITHOUT CONTRAST TECHNIQUE: Multidetector CT imaging of the head and cervical spine was performed following the standard protocol without intravenous contrast. Multiplanar CT image reconstructions of the cervical spine were also generated. COMPARISON:  Head CT 12/30/2018 FINDINGS: CT HEAD FINDINGS Brain: Stable appearing age advanced cerebral atrophy, ventriculomegaly and periventricular white matter disease. The degree of ventriculomegaly may be slightly out of proportion to the degree of cerebral atrophy and fourth ventricle appears relatively normal. Could not exclude some component of none communicating hydrocephalus. No extra-axial fluid collections are identified. No CT findings for acute hemispheric infarction or intracranial hemorrhage. No mass lesions. The brainstem and cerebellum are normal. Vascular: No hyperdense vessels or obvious aneurysm. Skull: No skull fracture or bone lesion. Sinuses/Orbits: Scattered ethmoid sinus disease. The other paranasal sinuses are clear and the mastoid air cells and middle ear cavities are clear. The globes are intact. Other: No scalp lesions or scalp hematoma. CT CERVICAL SPINE FINDINGS Alignment: Normal Skull base and vertebrae: No acute fracture. No primary bone lesion or focal pathologic process. Soft tissues and spinal canal: No prevertebral fluid or swelling. No visible canal hematoma. Disc levels: The spinal canal is fairly generous. No significant spinal or foraminal stenosis. Upper chest: The visualized  lung apices are grossly clear. Other: No significant findings in the neck or upper mediastinum. IMPRESSION: 1. Stable CT appearance of the brain compared to multiple previous studies. Significant age advanced cerebral atrophy, ventriculomegaly and periventricular white matter disease. 2. No acute intracranial findings or skull fracture. 3. Normal alignment of the cervical vertebral bodies and no acute cervical spine fracture. Electronically Signed   By: Rudie MeyerP.  Gallerani M.D.   On: 03/03/2019 14:18    Procedures Procedures (including critical care time)  Medications Ordered in ED Medications - No data to display   Initial Impression / Assessment and Plan / ED Course  I have reviewed the triage vital signs and the nursing notes.  Pertinent labs & imaging results that were available during my care of the patient were reviewed by me and considered in my medical decision making (see chart for details).        Kenneth Mcdaniel is a 63 y.o. male  with a past medical history significant for dementia, prior seizures, and hyperlipidemia who presents with a mechanical fall.  Patient had a unwitnessed fall and was placed in a cervical immobilization collar after he reportedly tripped with his walker over a bedside commode.  Patient denies any complaints on arrival aside from mild headache.  He denies any neck pain.  Denies any chest pain or palpitations.  No other preceding symptoms.  On exam, no significant bruising.  Mild tenderness in paraspinal neck.  No bruising or laceration seen.  Chest nontender and lungs clear.  Good pulses in extremities.  He moves all extremities.  He is disoriented.  Patient will have CT head and neck as well as chest x-ray.  If imaging is reassuring, dissipate discharge home.  Chest x-ray shows possible age-indeterminate fracture of the left anterior fifth rib.  On my assessment he is not tender whatsoever, doubt acute fracture at this time.  Imaging of the head and neck did not  show new injuries.  Given his lack of preceding symptoms or further symptoms, will hold on other labs or imaging.  Patient be discharged back to his facility after we see no new significant traumatic injuries.  He was instructed follow-up with PCP for further management.       Final Clinical Impressions(s) / ED Diagnoses   Final diagnoses:  Fall, initial encounter    ED Discharge Orders    None      Clinical Impression: 1. Fall, initial encounter     Disposition: Discharge  Condition: Good  I have discussed the results, Dx and Tx plan with the pt(& family if present). He/she/they expressed understanding and agree(s) with the plan. Discharge instructions discussed at great length. Strict return precautions discussed and pt &/or family have verbalized understanding of the instructions. No further questions at time of discharge.    New Prescriptions   No medications on file    Follow Up: Elijio Miles., MD [address] [place] [address] [phone]     [facility] [address] [id number] mc [address] [place] [address] [phone]       Asaad Gulley, Canary Brim, MD 03/03/19 [phone]

## 2019-03-21 ENCOUNTER — Ambulatory Visit: Payer: BC Managed Care – PPO | Admitting: Neurology

## 2019-04-24 ENCOUNTER — Emergency Department (HOSPITAL_COMMUNITY)
Admission: EM | Admit: 2019-04-24 | Discharge: 2019-04-25 | Disposition: A | Discharge status: Home / Self Care | Payer: BC Managed Care – PPO | Attending: Emergency Medicine | Admitting: Emergency Medicine

## 2019-04-24 ENCOUNTER — Emergency Department (HOSPITAL_COMMUNITY): Payer: BC Managed Care – PPO

## 2019-04-24 ENCOUNTER — Encounter (HOSPITAL_COMMUNITY): Payer: Self-pay

## 2019-04-24 ENCOUNTER — Other Ambulatory Visit: Payer: Self-pay

## 2019-04-24 DIAGNOSIS — W19XXXA Unspecified fall, initial encounter: Secondary | ICD-10-CM | POA: Insufficient documentation

## 2019-04-24 DIAGNOSIS — Z79899 Other long term (current) drug therapy: Secondary | ICD-10-CM | POA: Diagnosis not present

## 2019-04-24 DIAGNOSIS — G309 Alzheimer's disease, unspecified: Secondary | ICD-10-CM | POA: Diagnosis not present

## 2019-04-24 DIAGNOSIS — R0789 Other chest pain: Secondary | ICD-10-CM | POA: Insufficient documentation

## 2019-04-24 DIAGNOSIS — S0083XA Contusion of other part of head, initial encounter: Secondary | ICD-10-CM | POA: Diagnosis not present

## 2019-04-24 DIAGNOSIS — Y999 Unspecified external cause status: Secondary | ICD-10-CM | POA: Diagnosis not present

## 2019-04-24 DIAGNOSIS — Y92129 Unspecified place in nursing home as the place of occurrence of the external cause: Secondary | ICD-10-CM | POA: Insufficient documentation

## 2019-04-24 DIAGNOSIS — S0990XA Unspecified injury of head, initial encounter: Secondary | ICD-10-CM | POA: Diagnosis present

## 2019-04-24 DIAGNOSIS — Y939 Activity, unspecified: Secondary | ICD-10-CM | POA: Diagnosis not present

## 2019-04-24 NOTE — ED Notes (Signed)
Called report to [facility] spoke to [employer] LPN

## 2019-04-24 NOTE — ED Provider Notes (Signed)
[facility]-EMER[facility]ENC[facility] DEPT Provider Note   CSN: [id number] Arrival date & time: 04/24/19  1937     History No chief complaint on file.   Kenneth Mcdaniel is a 64 y.o. male.  Patient has a history of dementia and fell on his head today no loss conscious  The history is provided by the nursing home.  Fall This is a new problem. The current episode started less than 1 hour ago. The problem occurs constantly. The problem has been resolved. Associated symptoms include chest pain. Nothing aggravates the symptoms. Nothing relieves the symptoms. He has tried nothing for the symptoms.       Past Medical History:  Diagnosis Date   Agitation    Early onset Alzheimer's dementia ([facility])    from [facility]   Hyperlipidemia    PVD (peripheral vascular disease) [facility])     Patient Active Problem List   Diagnosis Date Noted   UTI (urinary tract infection) 11/20/2018   Seizure ([facility]) 11/20/2018   Sepsis ([facility]) 11/20/2018   Dementia ([facility]) 03/21/2017    History reviewed. No pertinent surgical history.     Family History  Problem Relation Age of Onset   Stroke Mother    Diabetes Neg Hx    Hypertension Neg Hx     Social History   Tobacco Use   Smoking status: Never Smoker   Smokeless tobacco: Never Used  Substance Use Topics   Alcohol use: Never   Drug use: No    Home Medications Prior to Admission medications   Medication Sig Start Date End Date Taking? Authorizing Provider  ALPRAZolam Prudy Feeler) 0.5 MG tablet Take 1 tablet (0.5 mg total) by mouth 2 (two) times daily. 11/23/18  Yes Sharl Ma, Sarina Ill, MD  ALPRAZolam Prudy Feeler) 1 MG tablet Take 1 tablet (1 mg total) by mouth at bedtime. 11/23/18  Yes Meredeth Ide, MD  divalproex (DEPAKOTE SPRINKLE) 125 MG capsule Take 4 capsules (500 mg total) by mouth 2 (two) times daily. 12/21/18  Yes Margarita Grizzle, MD  feeding supplement, GLUCERNA SHAKE, (GLUCERNA SHAKE) LIQD Take 237 mLs by mouth 3 (three) times daily  between meals. Drink 1 shake (237 ml's) by mouth three times a day and with any snacks (CHOCOLATE)    Yes [provider]  mirtazapine (REMERON) 15 MG tablet Take 15 mg by mouth at bedtime.   Yes [provider]  traZODone (DESYREL) 100 MG tablet Take 100 mg by mouth at bedtime.  08/16/18  Yes [provider]    Allergies    Patient has no known allergies.  Review of Systems   Review of Systems  Unable to perform ROS: Dementia  Cardiovascular: Positive for chest pain.    Physical Exam Updated Vital Signs BP (!) 141/82    Pulse (!) 53    Temp 97.6 F (36.4 C) (Oral)    Resp 13    SpO2 98%   Physical Exam Vitals and nursing note reviewed.  Constitutional:      Appearance: He is well-developed.  HENT:     Head: Normocephalic.     Comments: Bruising to right forehead    Nose: Nose normal.  Eyes:     General: No scleral icterus.    Conjunctiva/sclera: Conjunctivae normal.  Neck:     Thyroid: No thyromegaly.  Cardiovascular:     Rate and Rhythm: Normal rate and regular rhythm.     Heart sounds: No murmur. No friction rub. No gallop.   Pulmonary:  Breath sounds: No stridor. No wheezing or rales.  Chest:     Chest wall: No tenderness.  Abdominal:     General: There is no distension.     Tenderness: There is no abdominal tenderness. There is no rebound.  Musculoskeletal:        General: Normal range of motion.     Cervical back: Neck supple.  Lymphadenopathy:     Cervical: No cervical adenopathy.  Skin:    Findings: No erythema or rash.  Neurological:     Mental Status: He is alert.     Motor: No abnormal muscle tone.     Coordination: Coordination normal.  Psychiatric:        Mood and Affect: Mood normal.     ED Results / Procedures / Treatments   Labs (all labs ordered are listed, but only abnormal results are displayed) Labs Reviewed - No data to display  EKG None  Radiology CT Head Wo Contrast  Result Date: 04/24/2019 CLINICAL  DATA:  Status post trauma. EXAM: CT HEAD WITHOUT CONTRAST TECHNIQUE: Contiguous axial images were obtained from the base of the skull through the vertex without intravenous contrast. COMPARISON:  March 03, 2019 FINDINGS: Brain: There is moderate severity cerebral atrophy with widening of the extra-axial spaces and ventricular dilatation. There are areas of decreased attenuation within the white matter tracts of the supratentorial brain, consistent with microvascular disease changes. Vascular: No hyperdense vessel or unexpected calcification. Skull: Normal. Negative for fracture or focal lesion. Sinuses/Orbits: A small metallic density fixation plate is seen along the anterior wall of the right maxillary sinus. Other: There is mild right frontal scalp soft tissue swelling. An associated 2.4 cm x 0.8 cm right frontal scalp hematoma is noted. IMPRESSION: 1. Right frontal scalp soft tissue swelling and small right frontal scalp hematoma. 2. No acute intracranial abnormality. 3. Moderate severity cerebral atrophy and microvascular disease changes of the supratentorial brain. Electronically Signed   By: Aram Candela M.D.   On: 04/24/2019 22:26   CT Cervical Spine Wo Contrast  Result Date: 04/24/2019 CLINICAL DATA:  Status post trauma. EXAM: CT CERVICAL SPINE WITHOUT CONTRAST TECHNIQUE: Multidetector CT imaging of the cervical spine was performed without intravenous contrast. Multiplanar CT image reconstructions were also generated. COMPARISON:  March 03, 2019 FINDINGS: Alignment: Normal. Skull base and vertebrae: No acute fracture. No primary bone lesion or focal pathologic process. Soft tissues and spinal canal: No prevertebral fluid or swelling. No visible canal hematoma. Disc levels: C2-3: There is mild end plate spondylosis. Mild disc space narrowing is seen. Bilateral facet hypertrophy is noted. Normal central canal and intervertebral neuroforamina. C3-4: There is mild end plate spondylosis. Mild disc  space narrowing is seen. Bilateral facet hypertrophy is noted. Normal central canal and intervertebral neuroforamina. C4-5: There is mild to moderate severity end plate spondylosis. Mild disc space narrowing is seen. Bilateral facet hypertrophy is noted. Normal central canal and intervertebral neuroforamina. C5-6: There is mild end plate spondylosis. Mild disc space narrowing is seen. Bilateral facet hypertrophy is noted. Normal central canal and intervertebral neuroforamina. C6-7: There is mild end plate spondylosis. Mild disc space narrowing is seen. Bilateral facet hypertrophy is noted. Normal central canal and intervertebral neuroforamina. C7-T1: There is mild end plate spondylosis. Mild disc space narrowing is seen. Bilateral facet hypertrophy is noted. Normal central canal and intervertebral neuroforamina. Upper chest: Negative. Other: A stable 1.0 cm x 0.4 cm sclerotic focus is seen within the first left rib. IMPRESSION: 1. No acute osseous abnormality.  2. Mild degenerative changes, slightly more prominent at the levels of C4-C5 and C5-C6. Electronically Signed   By: Virgina Norfolk M.D.   On: 04/24/2019 22:40    Procedures Procedures (including critical care time)  Medications Ordered in ED Medications - No data to display  ED Course  I have reviewed the triage vital signs and the nursing notes.  Pertinent labs & imaging results that were available during my care of the patient were reviewed by me and considered in my medical decision making (see chart for details).    MDM Rules/Calculators/A&P                      CT scan is negative.  Patient with contusion to forehead.  He is discharged home with Tylenol Final Clinical Impression(s) / ED Diagnoses Final diagnoses:  Fall, initial encounter    Rx / DC Orders ED Discharge Orders    None       Milton Ferguson, MD 04/24/19 2259

## 2019-04-24 NOTE — ED Triage Notes (Signed)
BIB EMS from [facility]. Hospsic Pt DNR. Unwitnessed fall from sofa. Pt ambulatory after fall. C-collar in place. Hematoma to right side above eyebrow. No other complaints from pt. Hx of Dementia oriented to self.    118/82 60 22 97.3 CBG 204

## 2019-04-24 NOTE — ED Notes (Signed)
Ptar called 

## 2019-04-24 NOTE — Discharge Instructions (Addendum)
Tylenol for pain.  Follow-up as needed 

## 2019-04-30 ENCOUNTER — Emergency Department (HOSPITAL_COMMUNITY)
Admission: EM | Admit: 2019-04-30 | Discharge: 2019-05-01 | Disposition: A | Discharge status: Home / Self Care | Payer: BC Managed Care – PPO | Attending: Emergency Medicine | Admitting: Emergency Medicine

## 2019-04-30 DIAGNOSIS — N390 Urinary tract infection, site not specified: Secondary | ICD-10-CM | POA: Diagnosis not present

## 2019-04-30 DIAGNOSIS — F0390 Unspecified dementia without behavioral disturbance: Secondary | ICD-10-CM | POA: Insufficient documentation

## 2019-04-30 DIAGNOSIS — Z79899 Other long term (current) drug therapy: Secondary | ICD-10-CM | POA: Insufficient documentation

## 2019-04-30 DIAGNOSIS — R5383 Other fatigue: Secondary | ICD-10-CM | POA: Diagnosis present

## 2019-04-30 LAB — COMPREHENSIVE METABOLIC PANEL
ALT: 28 U/L (ref 0–44)
AST: 26 U/L (ref 15–41)
Albumin: 2.9 g/dL — ABNORMAL LOW (ref 3.5–5.0)
Alkaline Phosphatase: 50 U/L (ref 38–126)
Anion gap: 7 (ref 5–15)
BUN: 18 mg/dL (ref 8–23)
CO2: 30 mmol/L (ref 22–32)
Calcium: 8.6 mg/dL — ABNORMAL LOW (ref 8.9–10.3)
Chloride: 112 mmol/L — ABNORMAL HIGH (ref 98–111)
Creatinine, Ser: 0.7 mg/dL (ref 0.61–1.24)
GFR calc Af Amer: >60 mL/min (ref >60)
GFR calc non Af Amer: >60 mL/min (ref >60)
Glucose, Bld: 94 mg/dL (ref 70–99)
Potassium: 3.2 mmol/L — ABNORMAL LOW (ref 3.5–5.1)
Sodium: 149 mmol/L — ABNORMAL HIGH (ref 135–145)
Total Bilirubin: 0.7 mg/dL (ref 0.3–1.2)
Total Protein: 5.4 g/dL — ABNORMAL LOW (ref 6.5–8.1)

## 2019-04-30 LAB — URINALYSIS, ROUTINE W REFLEX MICROSCOPIC
Bilirubin Urine: NEGATIVE
Glucose, UA: NEGATIVE mg/dL
Hgb urine dipstick: NEGATIVE
Ketones, ur: 5 mg/dL — AB
Nitrite: POSITIVE — AB
Protein, ur: NEGATIVE mg/dL
Specific Gravity, Urine: 1.019 (ref 1.005–1.030)
pH: 8 (ref 5.0–8.0)

## 2019-04-30 LAB — CBG MONITORING, ED: Glucose-Capillary: 80 mg/dL (ref 70–99)

## 2019-04-30 LAB — CBC
HCT: 37.9 % — ABNORMAL LOW (ref 39.0–52.0)
Hemoglobin: 12.4 g/dL — ABNORMAL LOW (ref 13.0–17.0)
MCH: 31.4 pg (ref 26.0–34.0)
MCHC: 32.7 g/dL (ref 30.0–36.0)
MCV: 95.9 fL (ref 80.0–100.0)
Platelets: 134 10*3/uL — ABNORMAL LOW (ref 150–400)
RBC: 3.95 MIL/uL — ABNORMAL LOW (ref 4.22–5.81)
RDW: 14.3 % (ref 11.5–15.5)
WBC: 5.5 10*3/uL (ref 4.0–10.5)
nRBC: 0 % (ref 0.0–0.2)

## 2019-04-30 MED ORDER — CEPHALEXIN 500 MG PO CAPS: 1000.0000 mg | ORAL_CAPSULE | Freq: Two times a day (BID) | ORAL | 0 refills | Status: DC

## 2019-04-30 MED ORDER — SODIUM CHLORIDE 0.9 % IV SOLN: 1.0000 g | Freq: Once | INTRAVENOUS | Status: DC

## 2019-04-30 MED ORDER — CEPHALEXIN 250 MG PO CAPS
1000.0000 mg | ORAL_CAPSULE | Freq: Once | ORAL | Status: AC
  Administered 2019-04-30: 1000 mg via ORAL
  Filled 2019-04-30: qty 4

## 2019-04-30 MED ORDER — SODIUM CHLORIDE 0.9% FLUSH: 3.0000 mL | Freq: Once | INTRAVENOUS | Status: DC

## 2019-04-30 NOTE — ED Notes (Signed)
Urine culture sent with urinalysis.  

## 2019-04-30 NOTE — ED Provider Notes (Signed)
I assigned myself to see the patient however he never arrived in the room to be seen for treatment.   Mancel Bale, MD 04/30/19 [phone]

## 2019-04-30 NOTE — ED Notes (Signed)
Pt up and walking into hallway.

## 2019-04-30 NOTE — ED Provider Notes (Addendum)
[facility] EMERGENCY DEPARTMENT Provider Note   CSN: [id number] Arrival date & time: 04/30/19  1548     History Chief Complaint  Patient presents with  . Fatigue    Kenneth Mcdaniel is a 64 y.o. male.  HPI Patient is sent to the emergency department by EMS from return placed for lethargy.  Reportedly the patient was having usual behavior yesterday.  He typically walks around the facility although has frequent falls, he interacts with staff.  He does have advanced dementia, reportedly he interacts verbally with staff but his speech does not make sense.  This morning he ate less than usual for breakfast and lunch.  Patient cannot give any meaningful history.  He is alert and he will say yes to almost all questions.  He is awake and fidgety.    Past Medical History:  Diagnosis Date  . Agitation   . Early onset Alzheimer's dementia (HCC)    from [facility]  . Hyperlipidemia   . PVD (peripheral vascular disease) [facility])     Patient Active Problem List   Diagnosis Date Noted  . UTI (urinary tract infection) 11/20/2018  . Seizure (HCC) 11/20/2018  . Sepsis (HCC) 11/20/2018  . Dementia (HCC) 03/21/2017    No past surgical history on file.     Family History  Problem Relation Age of Onset  . Stroke Mother   . Diabetes Neg Hx   . Hypertension Neg Hx     Social History   Tobacco Use  . Smoking status: Never Smoker  . Smokeless tobacco: Never Used  Substance Use Topics  . Alcohol use: Never  . Drug use: No    Home Medications Prior to Admission medications   Medication Sig Start Date End Date Taking? Authorizing Provider  acetaminophen (TYLENOL) 500 MG tablet Take 500 mg by mouth every 6 (six) hours as needed for mild pain or fever.   Yes [provider]  ALPRAZolam (XANAX) 0.5 MG tablet Take 1 tablet (0.5 mg total) by mouth 2 (two) times daily. 11/23/18  Yes Sharl Ma, Sarina Ill, MD  ALPRAZolam Prudy Feeler) 1 MG tablet Take 1 tablet (1 mg total) by  mouth at bedtime. 11/23/18  Yes Meredeth Ide, MD  divalproex (DEPAKOTE SPRINKLE) 125 MG capsule Take 4 capsules (500 mg total) by mouth 2 (two) times daily. 12/21/18  Yes Margarita Grizzle, MD  feeding supplement, GLUCERNA SHAKE, (GLUCERNA SHAKE) LIQD Take 237 mLs by mouth See admin instructions. Drink 1 shake (237 ml's) by mouth three times a day and with any snacks (CHOCOLATE)    Yes [provider]  mirtazapine (REMERON) 15 MG tablet Take 15 mg by mouth at bedtime.   Yes [provider]  traZODone (DESYREL) 100 MG tablet Take 100 mg by mouth at bedtime.  08/16/18  Yes [provider]    Allergies    Patient has no known allergies.  Review of Systems   Review of Systems Level 5 caveat cannot obtain review of systems due to dementia Physical Exam Updated Vital Signs BP (!) 130/99 (BP Location: Right Arm)   Pulse 74   Temp 97.7 F (36.5 C)   Resp 16   SpO2 96%   Physical Exam Constitutional:      Comments: Patient is alert.  He is very thin.  He shows no signs of lethargy.  No respiratory distress.  He is somewhat fidgety and does not want to stay in the stretcher.  He has been sitting in a  recliner chair under direct nursing observation due to a desire to get up and walk around.  HENT:     Head:     Comments: Patient has an older appearing hematoma on the forehead.  This is resolving.  No erythema or warmth.    Mouth/Throat:     Mouth: Mucous membranes are moist.     Pharynx: Oropharynx is clear.  Eyes:     Extraocular Movements: Extraocular movements intact.  Cardiovascular:     Rate and Rhythm: Normal rate and regular rhythm.  Pulmonary:     Effort: Pulmonary effort is normal.     Breath sounds: Normal breath sounds.  Abdominal:     General: There is no distension.     Palpations: Abdomen is soft.     Tenderness: There is no abdominal tenderness. There is no guarding.  Musculoskeletal:        General: No swelling or tenderness. Normal range of motion.       Right lower leg: No edema.     Left lower leg: No edema.  Skin:    General: Skin is warm and dry.  Neurological:     Comments: Patient is very awake.  He is looking around.  He is trying to get up out of his recliner chair.  He is moving all extremities and trying to remove his blankets.  He will say yes to almost any question.  He makes some sentences but they do not make any sense.     ED Results / Procedures / Treatments   Labs (all labs ordered are listed, but only abnormal results are displayed) Labs Reviewed  COMPREHENSIVE METABOLIC PANEL - Abnormal; Notable for the following components:      Result Value   Sodium 149 (*)    Potassium 3.2 (*)    Chloride 112 (*)    Calcium 8.6 (*)    Total Protein 5.4 (*)    Albumin 2.9 (*)    All other components within normal limits  CBC - Abnormal; Notable for the following components:   RBC 3.95 (*)    Hemoglobin 12.4 (*)    HCT 37.9 (*)    Platelets 134 (*)    All other components within normal limits  URINALYSIS, ROUTINE W REFLEX MICROSCOPIC - Abnormal; Notable for the following components:   APPearance HAZY (*)    Ketones, ur 5 (*)    Nitrite POSITIVE (*)    Leukocytes,Ua SMALL (*)    Bacteria, UA RARE (*)    All other components within normal limits  URINE CULTURE  CULTURE, BLOOD (ROUTINE X 2)  CULTURE, BLOOD (ROUTINE X 2)  LACTIC ACID, PLASMA  LACTIC ACID, PLASMA  CBG MONITORING, ED  CBG MONITORING, ED    EKG EKG Interpretation  Date/Time:  Wednesday April 30 2019 15:52:01 EST Ventricular Rate:  57 PR Interval:  146 QRS Duration: 94 QT Interval:  426 QTC Calculation: 414 R Axis:   76 Text Interpretation: Sinus bradycardia Nonspecific ST and T wave abnormality Abnormal ECG no sig change from old Confirmed by Arby Barrette [phone]) on 04/30/2019 4:19:41 PM   Radiology No results found.  Procedures Procedures (including critical care time)  Medications Ordered in ED Medications  sodium chloride flush  (NS) 0.9 % injection 3 mL (3 mLs Intravenous Not Given 04/30/19 1909)  cefTRIAXone (ROCEPHIN) 1 g in sodium chloride 0.9 % 100 mL IVPB (has no administration in time range)    ED Course  I have reviewed the triage  vital signs and the nursing notes.  Pertinent labs & imaging results that were available during my care of the patient were reviewed by me and considered in my medical decision making (see chart for details).    MDM Rules/Calculators/A&P                      Patient is urine is positive for nitrites and leuk esterase as well as white cells.  Patient does not have any leukocytosis or fever.  Per EMS report, this appears to be pretty close to the patient's baseline.  He does not show any signs of being lethargic.  He does not appear delirious but demented and wishing to get up and move around constantly.  At this time vital signs are stable.  Was planning to give IV Rocephin first dose however, patient's nurse has informing the patient has eaten a sandwich and had several drinks.  He is having no difficulty taking oral intake.  He is not acutely ill in appearance.  Will change to oral antibiotic for UTI.  Patient is a chronic fall risk as documented in the chart. Final Clinical Impression(s) / ED Diagnoses Final diagnoses:  Urinary tract infection without hematuria, site unspecified  Dementia without behavioral disturbance, unspecified dementia type [facility])    Rx / DC Orders ED Discharge Orders    None       Charlesetta Shanks, MD 04/30/19 2245    Charlesetta Shanks, MD 04/30/19 2249

## 2019-04-30 NOTE — ED Triage Notes (Signed)
Pt here via [facility] from [facility] for lethargy.  They stated that yesterday he was his normal self. They stated he normally walks around the facility, though he is a fall risk, and he interacts with staff, though his responses are normally nonsensical d/t advanced dementia. This morning he ate a smaller breakfast than normal and a small portion for lunch.  Unsure if he was able to ambulate today.  Pt is unable to follow commands.  Vs  97.5 temp 117/54 Hr 56 rr 12 98% RA  cbg 111  20 L AC

## 2019-04-30 NOTE — ED Notes (Signed)
Pt. began to wheel himself with the stool in the room, this tech redirected him into the bed. This tech turned on the tv for the pt.

## 2019-04-30 NOTE — ED Notes (Signed)
This pt. was cleaned, and changed by this tech with the assistance of 2 RNs.

## 2019-04-30 NOTE — Discharge Instructions (Addendum)
1.  Give Keflex as prescribed. 2.  Continue regular care. 3.  Recheck by facility physician as soon as possible.

## 2019-04-30 NOTE — ED Notes (Addendum)
Patient's spouse updated on patient's condition and plan of care  , [facility] nursing home notified on patient's discharge , [facility] notified by [employer] operational officer for transport.

## 2019-05-01 ENCOUNTER — Other Ambulatory Visit: Payer: Self-pay

## 2019-05-01 ENCOUNTER — Encounter (HOSPITAL_COMMUNITY): Payer: Self-pay | Admitting: *Deleted

## 2019-05-01 ENCOUNTER — Emergency Department (HOSPITAL_COMMUNITY): Payer: BC Managed Care – PPO

## 2019-05-01 ENCOUNTER — Observation Stay (HOSPITAL_COMMUNITY)
Admission: EM | Admit: 2019-05-01 | Discharge: 2019-05-05 | Disposition: A | Discharge status: Skilled Nursing Facility | Payer: BC Managed Care – PPO | Attending: Internal Medicine | Admitting: Internal Medicine

## 2019-05-01 DIAGNOSIS — D72829 Elevated white blood cell count, unspecified: Secondary | ICD-10-CM | POA: Insufficient documentation

## 2019-05-01 DIAGNOSIS — Z66 Do not resuscitate: Secondary | ICD-10-CM

## 2019-05-01 DIAGNOSIS — B9561 Methicillin susceptible Staphylococcus aureus infection as the cause of diseases classified elsewhere: Secondary | ICD-10-CM | POA: Diagnosis present

## 2019-05-01 DIAGNOSIS — E785 Hyperlipidemia, unspecified: Secondary | ICD-10-CM | POA: Diagnosis not present

## 2019-05-01 DIAGNOSIS — A411 Sepsis due to other specified staphylococcus: Secondary | ICD-10-CM | POA: Diagnosis not present

## 2019-05-01 DIAGNOSIS — R1310 Dysphagia, unspecified: Secondary | ICD-10-CM

## 2019-05-01 DIAGNOSIS — I739 Peripheral vascular disease, unspecified: Secondary | ICD-10-CM | POA: Diagnosis present

## 2019-05-01 DIAGNOSIS — Z20822 Contact with and (suspected) exposure to covid-19: Secondary | ICD-10-CM | POA: Diagnosis present

## 2019-05-01 DIAGNOSIS — W19XXXA Unspecified fall, initial encounter: Secondary | ICD-10-CM | POA: Insufficient documentation

## 2019-05-01 DIAGNOSIS — N390 Urinary tract infection, site not specified: Principal | ICD-10-CM | POA: Insufficient documentation

## 2019-05-01 DIAGNOSIS — F0280 Dementia in other diseases classified elsewhere without behavioral disturbance: Secondary | ICD-10-CM | POA: Insufficient documentation

## 2019-05-01 DIAGNOSIS — Z79899 Other long term (current) drug therapy: Secondary | ICD-10-CM | POA: Diagnosis not present

## 2019-05-01 DIAGNOSIS — Z823 Family history of stroke: Secondary | ICD-10-CM | POA: Insufficient documentation

## 2019-05-01 DIAGNOSIS — Y939 Activity, unspecified: Secondary | ICD-10-CM | POA: Diagnosis not present

## 2019-05-01 DIAGNOSIS — G300 Alzheimer's disease with early onset: Secondary | ICD-10-CM | POA: Diagnosis not present

## 2019-05-01 DIAGNOSIS — R569 Unspecified convulsions: Secondary | ICD-10-CM

## 2019-05-01 DIAGNOSIS — R7881 Bacteremia: Secondary | ICD-10-CM

## 2019-05-01 DIAGNOSIS — Z9181 History of falling: Secondary | ICD-10-CM | POA: Diagnosis not present

## 2019-05-01 DIAGNOSIS — A419 Sepsis, unspecified organism: Secondary | ICD-10-CM | POA: Diagnosis present

## 2019-05-01 DIAGNOSIS — I959 Hypotension, unspecified: Secondary | ICD-10-CM | POA: Diagnosis present

## 2019-05-01 DIAGNOSIS — F0390 Unspecified dementia without behavioral disturbance: Secondary | ICD-10-CM | POA: Diagnosis not present

## 2019-05-01 DIAGNOSIS — B957 Other staphylococcus as the cause of diseases classified elsewhere: Secondary | ICD-10-CM | POA: Insufficient documentation

## 2019-05-01 DIAGNOSIS — G9340 Encephalopathy, unspecified: Secondary | ICD-10-CM

## 2019-05-01 DIAGNOSIS — R296 Repeated falls: Secondary | ICD-10-CM | POA: Diagnosis present

## 2019-05-01 DIAGNOSIS — Z87440 Personal history of urinary (tract) infections: Secondary | ICD-10-CM

## 2019-05-01 DIAGNOSIS — R636 Underweight: Secondary | ICD-10-CM | POA: Diagnosis present

## 2019-05-01 LAB — BLOOD CULTURE ID PANEL (REFLEXED)

## 2019-05-01 LAB — CBC WITH DIFFERENTIAL/PLATELET
Abs Immature Granulocytes: 0.29 10*3/uL — ABNORMAL HIGH (ref 0.00–0.07)
Basophils Absolute: 0 10*3/uL (ref 0.0–0.1)
Basophils Relative: 0 %
Eosinophils Absolute: 0 10*3/uL (ref 0.0–0.5)
Eosinophils Relative: 0 %
HCT: 35.2 % — ABNORMAL LOW (ref 39.0–52.0)
Hemoglobin: 11.8 g/dL — ABNORMAL LOW (ref 13.0–17.0)
Immature Granulocytes: 2 %
Lymphocytes Relative: 3 %
Lymphs Abs: 0.5 10*3/uL — ABNORMAL LOW (ref 0.7–4.0)
MCH: 31.8 pg (ref 26.0–34.0)
MCHC: 33.5 g/dL (ref 30.0–36.0)
MCV: 94.9 fL (ref 80.0–100.0)
Monocytes Absolute: 0.6 10*3/uL (ref 0.1–1.0)
Monocytes Relative: 4 %
Neutro Abs: 13.9 10*3/uL — ABNORMAL HIGH (ref 1.7–7.7)
Neutrophils Relative %: 91 %
Platelets: 111 10*3/uL — ABNORMAL LOW (ref 150–400)
RBC: 3.71 MIL/uL — ABNORMAL LOW (ref 4.22–5.81)
RDW: 14.4 % (ref 11.5–15.5)
WBC: 15.3 10*3/uL — ABNORMAL HIGH (ref 4.0–10.5)
nRBC: 0 % (ref 0.0–0.2)

## 2019-05-01 LAB — BASIC METABOLIC PANEL
Anion gap: 10 (ref 5–15)
BUN: 18 mg/dL (ref 8–23)
CO2: 30 mmol/L (ref 22–32)
Calcium: 8.4 mg/dL — ABNORMAL LOW (ref 8.9–10.3)
Chloride: 103 mmol/L (ref 98–111)
Creatinine, Ser: 1.03 mg/dL (ref 0.61–1.24)
GFR calc Af Amer: >60 mL/min (ref >60)
GFR calc non Af Amer: >60 mL/min (ref >60)
Glucose, Bld: 100 mg/dL — ABNORMAL HIGH (ref 70–99)
Potassium: 3.4 mmol/L — ABNORMAL LOW (ref 3.5–5.1)
Sodium: 143 mmol/L (ref 135–145)

## 2019-05-01 LAB — RESPIRATORY PANEL BY RT PCR (FLU A&B, COVID)
Influenza A by PCR: NEGATIVE
Influenza B by PCR: NEGATIVE
SARS Coronavirus 2 by RT PCR: NEGATIVE

## 2019-05-01 LAB — LACTIC ACID, PLASMA
Lactic Acid, Venous: 2.8 mmol/L (ref 0.5–1.9)
Lactic Acid, Venous: 2.1 mmol/L (ref 0.5–1.9)

## 2019-05-01 MED ORDER — VALPROATE SODIUM 500 MG/5ML IV SOLN: 500.0000 mg | Freq: Two times a day (BID) | INTRAVENOUS | Status: DC

## 2019-05-01 MED ORDER — SODIUM CHLORIDE 0.9 % IV SOLN
2.0000 g | INTRAVENOUS | Status: DC
  Administered 2019-05-02 – 2019-05-05 (×4): 2 g via INTRAVENOUS
  Filled 2019-05-01 (×4): qty 2

## 2019-05-01 MED ORDER — LACTATED RINGERS IV BOLUS
1000.0000 mL | Freq: Once | INTRAVENOUS | Status: AC
  Administered 2019-05-01: 1000 mL via INTRAVENOUS

## 2019-05-01 MED ORDER — CEPHALEXIN 250 MG PO CAPS
500.0000 mg | ORAL_CAPSULE | Freq: Once | ORAL | Status: AC
  Administered 2019-05-01: 10:00:00 500 mg via ORAL
  Filled 2019-05-01: qty 2

## 2019-05-01 MED ORDER — VANCOMYCIN HCL 1250 MG/250ML IV SOLN
1250.0000 mg | INTRAVENOUS | Status: DC
  Filled 2019-05-01: qty 250

## 2019-05-01 MED ORDER — SODIUM CHLORIDE 0.9 % IV SOLN
1.0000 g | INTRAVENOUS | Status: AC
  Administered 2019-05-01: 1 g via INTRAVENOUS
  Filled 2019-05-01: qty 10

## 2019-05-01 MED ORDER — SODIUM CHLORIDE 0.9 % IV SOLN: INTRAVENOUS | Status: DC

## 2019-05-01 MED ORDER — ENOXAPARIN SODIUM 40 MG/0.4ML ~~LOC~~ SOLN
40.0000 mg | SUBCUTANEOUS | Status: DC
  Administered 2019-05-03 – 2019-05-05 (×3): 40 mg via SUBCUTANEOUS
  Filled 2019-05-01 (×3): qty 0.4

## 2019-05-01 MED ORDER — SODIUM CHLORIDE 0.9 % IV SOLN: 1.0000 g | INTRAVENOUS | Status: DC

## 2019-05-01 MED ORDER — LORAZEPAM 2 MG/ML IJ SOLN
0.5000 mg | Freq: Two times a day (BID) | INTRAMUSCULAR | Status: DC | PRN
  Administered 2019-05-01 – 2019-05-05 (×6): 0.5 mg via INTRAVENOUS
  Filled 2019-05-01 (×6): qty 1

## 2019-05-01 MED ORDER — DIVALPROEX SODIUM 125 MG PO CSDR
500.0000 mg | DELAYED_RELEASE_CAPSULE | Freq: Two times a day (BID) | ORAL | Status: DC
  Administered 2019-05-01 – 2019-05-05 (×8): 500 mg via ORAL
  Filled 2019-05-01 (×9): qty 4

## 2019-05-01 MED ORDER — SODIUM CHLORIDE 0.9 % IV SOLN
1.0000 g | Freq: Once | INTRAVENOUS | Status: AC
  Administered 2019-05-01: 15:00:00 1 g via INTRAVENOUS
  Filled 2019-05-01: qty 10

## 2019-05-01 NOTE — ED Provider Notes (Addendum)
[facility] EMERGENCY DEPARTMENT Provider Note   CSN: [id number] Arrival date & time: 05/01/19  8938     History Chief Complaint  Patient presents with  . Fall    Kenneth Mcdaniel is a 64 y.o. male.  Pt is a 63y/o male with early onset dementia, with frequent falls and UTI who lives in a skilled facility and was seen in the ER yesterday for decreased appetite and no quite himself.  Pt found to have a UTI yesterday and was given rocephin and started on abx.  Pt while in the ER had to be monitored closely as he continued to try to get out of bed and was a fall risk.  Skilled facility reported that pt was found this morning on the floor and may have been there up to 1 hour.  No localized area of pain or concern.  Pt does not take anticoagulation.  He is sleeping on exam and does wake up but does not speak.  The history is provided by the EMS personnel, the nursing home and medical records. The history is limited by the absence of a caregiver and the condition of the patient.  Fall This is a recurrent problem. The current episode started 1 to 2 hours ago. The problem occurs constantly. The problem has been resolved. Associated symptoms comments: Pt is non-verbal but has no complaints.       Past Medical History:  Diagnosis Date  . Agitation   . Early onset Alzheimer's dementia (HCC)    from [facility]  . Hyperlipidemia   . PVD (peripheral vascular disease) [facility])     Patient Active Problem List   Diagnosis Date Noted  . UTI (urinary tract infection) 11/20/2018  . Seizure (HCC) 11/20/2018  . Sepsis (HCC) 11/20/2018  . Dementia (HCC) 03/21/2017    History reviewed. No pertinent surgical history.     Family History  Problem Relation Age of Onset  . Stroke Mother   . Diabetes Neg Hx   . Hypertension Neg Hx     Social History   Tobacco Use  . Smoking status: Never Smoker  . Smokeless tobacco: Never Used  Substance Use Topics  . Alcohol use: Never    . Drug use: No    Home Medications Prior to Admission medications   Medication Sig Start Date End Date Taking? Authorizing Provider  acetaminophen (TYLENOL) 500 MG tablet Take 500 mg by mouth every 6 (six) hours as needed for mild pain or fever.    [provider]  ALPRAZolam Prudy Feeler) 0.5 MG tablet Take 1 tablet (0.5 mg total) by mouth 2 (two) times daily. 11/23/18   Meredeth Ide, MD  ALPRAZolam Prudy Feeler) 1 MG tablet Take 1 tablet (1 mg total) by mouth at bedtime. 11/23/18   Meredeth Ide, MD  cephALEXin (KEFLEX) 500 MG capsule Take 2 capsules (1,000 mg total) by mouth 2 (two) times daily. 04/30/19   Arby Barrette, MD  divalproex (DEPAKOTE SPRINKLE) 125 MG capsule Take 4 capsules (500 mg total) by mouth 2 (two) times daily. 12/21/18   Margarita Grizzle, MD  feeding supplement, GLUCERNA SHAKE, (GLUCERNA SHAKE) LIQD Take 237 mLs by mouth See admin instructions. Drink 1 shake (237 ml's) by mouth three times a day and with any snacks (CHOCOLATE)     [provider]  mirtazapine (REMERON) 15 MG tablet Take 15 mg by mouth at bedtime.    [provider]  traZODone (DESYREL) 100 MG tablet Take 100 mg by  mouth at bedtime.  08/16/18   [provider]    Allergies    Patient has no known allergies.  Review of Systems   Review of Systems  Unable to perform ROS: Dementia    Physical Exam Updated Vital Signs BP (!) 88/62 (BP Location: Left Arm)   Pulse 89   Temp (!) 97.1 F (36.2 C) (Axillary)   Resp 16   SpO2 96%   Physical Exam Vitals and nursing note reviewed.  Constitutional:      General: He is not in acute distress.    Appearance: He is well-developed and underweight.  HENT:     Head: Normocephalic.      Mouth/Throat:     Mouth: Mucous membranes are moist.  Eyes:     Conjunctiva/sclera: Conjunctivae normal.     Pupils: Pupils are equal, round, and reactive to light.  Cardiovascular:     Rate and Rhythm: Normal rate and regular rhythm.     Pulses:  Normal pulses.     Heart sounds: No murmur.  Pulmonary:     Effort: Pulmonary effort is normal. No respiratory distress.     Breath sounds: Normal breath sounds. No wheezing or rales.  Abdominal:     General: There is no distension.     Palpations: Abdomen is soft.     Tenderness: There is no abdominal tenderness. There is no guarding or rebound.  Musculoskeletal:        General: No tenderness. Normal range of motion.     Cervical back: Normal range of motion. No tenderness.     Right lower leg: No edema.     Left lower leg: No edema.     Comments: Legs ranged without any producible pain  Skin:    General: Skin is warm and dry.     Findings: No erythema or rash.     Comments: Scattered ecchymosis over the upper and lower ext in various stages of healing.  Neurological:     Mental Status: He is alert.     Comments: Easily awakened and alert but does not speak.  Moving all ext  Psychiatric:     Comments: Calm but intermittently trying to get out of bed.     ED Results / Procedures / Treatments   Labs (all labs ordered are listed, but only abnormal results are displayed) Labs Reviewed  CBC WITH DIFFERENTIAL/PLATELET - Abnormal; Notable for the following components:      Result Value   WBC 15.3 (*)    RBC 3.71 (*)    Hemoglobin 11.8 (*)    HCT 35.2 (*)    Platelets 111 (*)    Neutro Abs 13.9 (*)    Lymphs Abs 0.5 (*)    Abs Immature Granulocytes 0.29 (*)    All other components within normal limits  BASIC METABOLIC PANEL - Abnormal; Notable for the following components:   Potassium 3.4 (*)    Glucose, Bld 100 (*)    Calcium 8.4 (*)    All other components within normal limits  LACTIC ACID, PLASMA - Abnormal; Notable for the following components:   Lactic Acid, Venous 2.8 (*)    All other components within normal limits    EKG None  Radiology CT HEAD WO CONTRAST  Result Date: 05/01/2019 CLINICAL DATA:  64 year old male with history of trauma from a fall. Head and  neck pain. EXAM: CT HEAD WITHOUT CONTRAST CT CERVICAL SPINE WITHOUT CONTRAST TECHNIQUE: Multidetector CT imaging of the head and  cervical spine was performed following the standard protocol without intravenous contrast. Multiplanar CT image reconstructions of the cervical spine were also generated. COMPARISON:  Head and neck CT 04/24/2019. FINDINGS: CT HEAD FINDINGS Brain: Moderate cerebral atrophy with ex vacuo dilatation of the ventricular system, similar to prior examinations. Patchy and confluent areas of decreased attenuation are noted throughout the deep and periventricular white matter of the cerebral hemispheres bilaterally, compatible with chronic microvascular ischemic disease. No evidence of acute infarction, hemorrhage, hydrocephalus, extra-axial collection or mass lesion/mass effect. Vascular: No hyperdense vessel or unexpected calcification. Skull: Normal. Negative for fracture or focal lesion. Fixation cerclage wires noted in the anterior aspect of the right maxillary bone. Sinuses/Orbits: No acute finding. Other: Previously noted small right frontal scalp hematoma has decreased in size, currently measuring up to 4 mm in thickness. CT CERVICAL SPINE FINDINGS Alignment: Normal. Skull base and vertebrae: No acute fracture. No primary bone lesion or focal pathologic process. Soft tissues and spinal canal: No prevertebral fluid or swelling. No visible canal hematoma. Disc levels: Mild multilevel degenerative disc disease, most pronounced at C2-C3, C3-C4 and C4-C5. Mild multilevel facet arthropathy. Upper chest: Unremarkable. Other: None. IMPRESSION: 1. No evidence of significant acute traumatic injury to the skull, brain or cervical spine. 2. Resolving right frontal scalp hematoma which has decreased in size compared to prior study from 04/24/2019. 3. Moderate cerebral atrophy with ex vacuo dilatation of the ventricular system. Although there is no definitive imaging evidence of normal pressure  hydrocephalus, given the patient's history of repeated falls and recurrent trauma, this should be considered clinically and further evaluated. 4. Mild multilevel degenerative disc disease and cervical spondylosis, as above. Electronically Signed   By: Trudie Reed M.D.   On: 05/01/2019 09:04   CT Cervical Spine Wo Contrast  Result Date: 05/01/2019 CLINICAL DATA:  64 year old male with history of trauma from a fall. Head and neck pain. EXAM: CT HEAD WITHOUT CONTRAST CT CERVICAL SPINE WITHOUT CONTRAST TECHNIQUE: Multidetector CT imaging of the head and cervical spine was performed following the standard protocol without intravenous contrast. Multiplanar CT image reconstructions of the cervical spine were also generated. COMPARISON:  Head and neck CT 04/24/2019. FINDINGS: CT HEAD FINDINGS Brain: Moderate cerebral atrophy with ex vacuo dilatation of the ventricular system, similar to prior examinations. Patchy and confluent areas of decreased attenuation are noted throughout the deep and periventricular white matter of the cerebral hemispheres bilaterally, compatible with chronic microvascular ischemic disease. No evidence of acute infarction, hemorrhage, hydrocephalus, extra-axial collection or mass lesion/mass effect. Vascular: No hyperdense vessel or unexpected calcification. Skull: Normal. Negative for fracture or focal lesion. Fixation cerclage wires noted in the anterior aspect of the right maxillary bone. Sinuses/Orbits: No acute finding. Other: Previously noted small right frontal scalp hematoma has decreased in size, currently measuring up to 4 mm in thickness. CT CERVICAL SPINE FINDINGS Alignment: Normal. Skull base and vertebrae: No acute fracture. No primary bone lesion or focal pathologic process. Soft tissues and spinal canal: No prevertebral fluid or swelling. No visible canal hematoma. Disc levels: Mild multilevel degenerative disc disease, most pronounced at C2-C3, C3-C4 and C4-C5. Mild  multilevel facet arthropathy. Upper chest: Unremarkable. Other: None. IMPRESSION: 1. No evidence of significant acute traumatic injury to the skull, brain or cervical spine. 2. Resolving right frontal scalp hematoma which has decreased in size compared to prior study from 04/24/2019. 3. Moderate cerebral atrophy with ex vacuo dilatation of the ventricular system. Although there is no definitive imaging evidence of normal pressure hydrocephalus,  given the patient's history of repeated falls and recurrent trauma, this should be considered clinically and further evaluated. 4. Mild multilevel degenerative disc disease and cervical spondylosis, as above. Electronically Signed   By: Trudie Reed M.D.   On: 05/01/2019 09:04    Procedures Procedures (including critical care time)  Medications Ordered in ED Medications - No data to display  ED Course  I have reviewed the triage vital signs and the nursing notes.  Pertinent labs & imaging results that were available during my care of the patient were reviewed by me and considered in my medical decision making (see chart for details).    MDM Rules/Calculators/A&P                      Elderly male with advanced dementia dx with UTI yesterday and d/ced back to facility with abx presenting after a fall.  Pt found on the floor near his bed this morning.  Pt here is moving all ext and opening his eyes.  Healing trauma over the right forehead but no focal area of new trauma.  Pt in c-collar but no pain with palpation.  Due to unwitnessed fall and pt unable to communicated with prior trauma to the head will scan to ensure no injury.  9:19 AM Imaging is negative without acute.  Patient C-spine is also negative and cervical spine was cleared.  Patient received his first dose of Keflex here but otherwise was transferred back to facility.  Initial blood pressure was 88/60 however on repeat check without intervention it is 106/67.  Suspect initial read was  inaccurate.  12:13 PM Prior to discharge repeat vital signs showed patient was persistently hypotensive.  He otherwise is still awake and looking around however given his recent UTI, possibly poor intake due to multiple hospital visits patient did drink 2 cups of water but blood pressure did not improve.  Will give IV fluids, recheck labs and follow blood pressures to ensure they improve.  Patient does not take any blood pressure medications.  2:06 PM Patient has a CBC with a leukocytosis of 15,000 and a mild left shift, BMP which is been within normal limits and elevated lactate of 2.8.  Will give repeat dose of Rocephin but given patient's persistent blood pressure in the upper 90s, new leukocytosis and elevated lactate with UTI discovered yesterday will admit for further care.  Today's Vitals   05/01/19 0723 05/01/19 0852 05/01/19 1107 05/01/19 1423  BP: (!) 88/62 106/67 (!) 86/67 97/65  Pulse: 89 98 78 79  Resp: 16 18 18 18   Temp: (!) 97.1 F (36.2 C)     TempSrc: Axillary     SpO2: 96% 100% 98%   PainSc:   0-No pain    There is no height or weight on file to calculate BMI.    Final Clinical Impression(s) / ED Diagnoses Final diagnoses:  Fall, initial encounter  Urinary tract infection without hematuria, site unspecified  Sepsis without acute organ dysfunction, due to unspecified organism [facility])    Rx / DC Orders ED Discharge Orders    None       [facility], MD 05/01/19 05/03/19    [id number], MD 05/01/19 1443

## 2019-05-01 NOTE — ED Notes (Addendum)
Patient was incont. Of urine and stool, patient cleaned and new gown given. Small broken area on buttocks  Pad applied.

## 2019-05-01 NOTE — ED Triage Notes (Signed)
Patient presents to ed via [facility] states patient had a unwitnessed fall, states he probably was on the floor for approx. 1 hour before being found. Upon arrival to ed patient is alert non-verbal of which per ems is his baseline. Hematoma pver his right eye. Patient was just discharged from ED for UTI.

## 2019-05-01 NOTE — ED Notes (Signed)
Patient's wife called and aware he will be returning to [facility].

## 2019-05-01 NOTE — ED Notes (Signed)
Hospice nurse Maralyn Sago) called and update given.

## 2019-05-01 NOTE — ED Notes (Signed)
Wife Judeth Cornfield called and update given on Patient.

## 2019-05-01 NOTE — Discharge Instructions (Signed)
Normal CT of head and neck.  He was given a dose of keflex this morning.

## 2019-05-01 NOTE — ED Notes (Signed)
Wife called and updated on patient status

## 2019-05-01 NOTE — H&P (Addendum)
Date: 05/01/2019               Patient Name:  Kenneth Mcdaniel MRN: [id number]  DOB: 1955-06-19 Age / Sex: 64 y.o., male   PCP: Derrill Center., MD         Medical Service: Internal Medicine Teaching Service         Attending Physician: Dr. Blanchie Dessert, MD    First Contact: Dr. Sheppard Coil Pager: [phone]  Second Contact: Dr. Eileen Stanford Pager: [phone]       After Hours (After 5p/  First Contact Pager: [phone]  weekends / holidays): Second Contact Pager: [phone]   Chief Complaint: AMS  History of Present Illness:  History was obtained via chart review, as patient is nonverbal.  Kenneth Mcdaniel is a 64 year old male with a past medical history significant for early onset dementia, frequent falls, and UTI who lives in a skilled nursing facility who presents today with AMS. The patient visited the ER yesterday for decreased PO intake and AMS and was given a dose of ceftriaxone and subsequently was discharged with the plan of continuing Keflex at his skilled nursing facility. This morning, it was reported that the patient was found down on the floor this morning at the facility, and may have been on the floor for up to 1 hour.  In the ED, the patient's was afebrile but hypotensive to the to the 29B systolic BP. CBC was notable for leukocytosis to 15.3 with left shift and elevated lactate to 2.8. CT head and C-spine were negative for an acute process or fractures. Pt received 2 L or LR in the ED and was started on ceftriaxone.   Meds: Current Meds  Medication Sig  . ALPRAZolam (XANAX) 0.5 MG tablet Take 1 tablet (0.5 mg total) by mouth 2 (two) times daily.  . divalproex (DEPAKOTE SPRINKLE) 125 MG capsule Take 4 capsules (500 mg total) by mouth 2 (two) times daily.  . feeding supplement, GLUCERNA SHAKE, (GLUCERNA SHAKE) LIQD Take 237 mLs by mouth See admin instructions. Drink 1 shake (237 ml's) by mouth three times a day and with any snacks (CHOCOLATE)   . mirtazapine (REMERON) 15 MG tablet  Take 15 mg by mouth at bedtime.  . traZODone (DESYREL) 100 MG tablet Take 100 mg by mouth at bedtime.    Allergies: Allergies as of 05/01/2019  . (No Known Allergies)   Past Medical History:  Diagnosis Date  . Agitation   . Early onset Alzheimer's dementia ([place])    from [facility]  . Hyperlipidemia   . PVD (peripheral vascular disease) ([place])    Family History:  Family History  Problem Relation Age of Onset  . Stroke Mother   . Diabetes Neg Hx   . Hypertension Neg Hx    Social History:  -Lives at SNF -Able to ambulate, but non verbal and doesn't follow commands consitently -Married   Review of Systems: Unable to obtain  Imaging: EKG: personally reviewed my interpretation is normal sinus rhythm, no signs of cute ischemia.  CT Head IMPRESSION: 1. No evidence of significant acute traumatic injury to the skull, brain or cervical spine. 2. Resolving right frontal scalp hematoma which has decreased in size compared to prior study from 04/24/2019. 3. Moderate cerebral atrophy with ex vacuo dilatation of the ventricular system. Although there is no definitive imaging evidence of normal pressure hydrocephalus, given the patient's history of repeated falls and recurrent trauma, this should be considered clinically and further evaluated. 4. Mild  multilevel degenerative disc disease and cervical spondylosis, as above.  CT C-spine: 1.Alignment: Normal 2. Skull base and vertebrae: No acute fracture. No primary bone lesion or focal pathologic process. 3. Soft tissues and spinal canal: No prevertebral fluid or swelling. No visible canal hematoma. 4. Disc levels: Mild multilevel degenerative disc disease, most pronounced at C2-C3, C3-C4 and C4-C5. Mild multilevel facet arthropathy. 5. Upper chest: Unremarkable.  Physical Exam: Blood pressure 97/65, pulse 79, temperature (!) 97.1 F (36.2 C), temperature source Axillary, resp. rate 18, SpO2 98 %.  Physical Exam Vitals  reviewed.  Constitutional:      General: He is not in acute distress.    Appearance: He is ill-appearing. He is not toxic-appearing.     Comments: Frail appearing  HENT:     Head: Normocephalic.     Comments: Healing hematoma upon the right aspect of the forehead Eyes:     General: No scleral icterus.       Right eye: No discharge.        Left eye: No discharge.     Extraocular Movements: Extraocular movements intact.     Conjunctiva/sclera: Conjunctivae normal.  Cardiovascular:     Rate and Rhythm: Normal rate and regular rhythm.     Pulses: Normal pulses.     Heart sounds: Normal heart sounds. No murmur. No friction rub. No gallop.   Pulmonary:     Effort: Pulmonary effort is normal. No respiratory distress.     Breath sounds: Normal breath sounds. No wheezing or rales.  Abdominal:     General: Abdomen is flat. There is no distension.     Palpations: Abdomen is soft.     Tenderness: There is no abdominal tenderness. There is no guarding.  Genitourinary:    Comments: No suprapubic tenderness noted Musculoskeletal:     Right lower leg: No edema.     Left lower leg: No edema.  Skin:    General: Skin is warm.  Neurological:     General: No focal deficit present.     Mental Status: He is alert. Mental status is at baseline.     Comments: Nonverbal, does not follow commands  Moves all 4 extremities spontaneously antigravity    Assessment & Plan by Problem: Active Problems:   Sepsis secondary to UTI [facility])  Kenneth Mcdaniel is a 64 year old male with a past medical history significant for early onset dementia, and frequent falls who lives in a skilled nursing facility who presents today with decreased p.o. intake, AMS, and hypotension (systolics 80s) leukocytosis with left shift. This is in the context of diagnosed UTI the day before and an ED visit in which the patient received a dose of ceftriaxone and was subsequently discharged with oral Keflex. His presentation is most consistent  with sepsis due to urinary source.  #Leukocytosis #Sepsis secondary to UTI: Was in the ED yesterday for the same presentation and was diagnosed with a UTI.  Patient was given a dose of ceftriaxone and then discharged with a plan to continue oral Keflex at his facility, however did not show significant improvement. Pt now returns with UA positive for nitrites and small leukocytes.  Has leukocytosis to 15.3 as well as elevated lactate of 2.8, painting a sepsis picture. Patient was started on ceftriaxone again in the ED. Hypotension to systolic 80s was treated with 2L LR boluses with improvement of blood pressures. -Continue IV Ceftriaxone -f/u urine and blood cultures -NS 100 cc/hr  #Early Onset Dementia: Patient is nonverbal at baseline.  Takes Depakote 500 mg twice daily, mirtazapine 50 mg nightly and trazodone 100 mg nightly at his facility.  Patient was initially not tolerating p.o., but per nursing, is tolerating it now.  Will start p.o. medications. -Oral Depakote 500 mg every 12 hours -Ativan 0.5mg  2 times daily PRN  #FEN/GI -Diet: Dysphagia 1 -Fluids: NS 100 cc/hr  #DVT prophylaxis -Lovenox 40 mg subq injections daily  #CODE STATUS: DNR  #Dispo: Admit patient to Observation with expected length of stay less than 2 midnights.  Signed: [name], MD Internal Medicine, PGY1 Pager: [phone]  05/01/2019,4:46 PM

## 2019-05-01 NOTE — ED Notes (Signed)
Ate 1/2 cup applesause and 2 cups water.

## 2019-05-01 NOTE — Progress Notes (Signed)
PHARMACY - PHYSICIAN COMMUNICATION CRITICAL VALUE ALERT - BLOOD CULTURE IDENTIFICATION (BCID)  Kenneth Mcdaniel is an 64 y.o. male who presented to [facility] on 05/01/2019 with a chief complaint of AMS  Assessment:  63 yom on ceftriaxone for UTI. Only 1 set of BCx drawn, with both bottles growing staph species (no methicillin resistance detected)  Name of physician (or Provider) Contacted: [name] ([facility])  Current antibiotics: ceftriaxone 1g IV q24h  Changes to prescribed antibiotics recommended: Continue ceftriaxone for now with UTI, increase dose to 2g IV q24h  Results for orders placed or performed during the hospital encounter of 04/30/19  Blood Culture ID Panel (Reflexed) (Collected: 04/30/2019 10:50 PM)  Result Value Ref Range   Enterococcus species NOT DETECTED NOT DETECTED   Listeria monocytogenes NOT DETECTED NOT DETECTED   Staphylococcus species DETECTED (A) NOT DETECTED   Staphylococcus aureus (BCID) NOT DETECTED NOT DETECTED   Methicillin resistance NOT DETECTED NOT DETECTED   Streptococcus species NOT DETECTED NOT DETECTED   Streptococcus agalactiae NOT DETECTED NOT DETECTED   Streptococcus pneumoniae NOT DETECTED NOT DETECTED   Streptococcus pyogenes NOT DETECTED NOT DETECTED   Acinetobacter baumannii NOT DETECTED NOT DETECTED   Enterobacteriaceae species NOT DETECTED NOT DETECTED   Enterobacter cloacae complex NOT DETECTED NOT DETECTED   Escherichia coli NOT DETECTED NOT DETECTED   Klebsiella oxytoca NOT DETECTED NOT DETECTED   Klebsiella pneumoniae NOT DETECTED NOT DETECTED   Proteus species NOT DETECTED NOT DETECTED   Serratia marcescens NOT DETECTED NOT DETECTED   Haemophilus influenzae NOT DETECTED NOT DETECTED   Neisseria meningitidis NOT DETECTED NOT DETECTED   Pseudomonas aeruginosa NOT DETECTED NOT DETECTED   Candida albicans NOT DETECTED NOT DETECTED   Candida glabrata NOT DETECTED NOT DETECTED   Candida krusei NOT DETECTED NOT DETECTED   Candida parapsilosis NOT DETECTED NOT DETECTED   Candida tropicalis NOT DETECTED NOT DETECTED     Babs Bertin, PharmD, BCPS Please check AMION for all [facility] contact numbers Clinical Pharmacist 05/01/2019 6:19 PM

## 2019-05-01 NOTE — ED Notes (Signed)
Admitting MD at bedside for admission assessment.

## 2019-05-02 ENCOUNTER — Observation Stay (HOSPITAL_COMMUNITY): Payer: BC Managed Care – PPO

## 2019-05-02 DIAGNOSIS — I739 Peripheral vascular disease, unspecified: Secondary | ICD-10-CM | POA: Diagnosis present

## 2019-05-02 DIAGNOSIS — R636 Underweight: Secondary | ICD-10-CM | POA: Diagnosis present

## 2019-05-02 DIAGNOSIS — F0390 Unspecified dementia without behavioral disturbance: Secondary | ICD-10-CM | POA: Diagnosis not present

## 2019-05-02 DIAGNOSIS — E785 Hyperlipidemia, unspecified: Secondary | ICD-10-CM | POA: Diagnosis present

## 2019-05-02 DIAGNOSIS — R1310 Dysphagia, unspecified: Secondary | ICD-10-CM | POA: Diagnosis not present

## 2019-05-02 DIAGNOSIS — A411 Sepsis due to other specified staphylococcus: Secondary | ICD-10-CM | POA: Diagnosis not present

## 2019-05-02 DIAGNOSIS — R569 Unspecified convulsions: Secondary | ICD-10-CM | POA: Diagnosis present

## 2019-05-02 DIAGNOSIS — R7881 Bacteremia: Secondary | ICD-10-CM

## 2019-05-02 DIAGNOSIS — R296 Repeated falls: Secondary | ICD-10-CM | POA: Diagnosis present

## 2019-05-02 DIAGNOSIS — Z79899 Other long term (current) drug therapy: Secondary | ICD-10-CM | POA: Diagnosis not present

## 2019-05-02 DIAGNOSIS — G300 Alzheimer's disease with early onset: Secondary | ICD-10-CM | POA: Diagnosis present

## 2019-05-02 DIAGNOSIS — Z66 Do not resuscitate: Secondary | ICD-10-CM | POA: Diagnosis present

## 2019-05-02 DIAGNOSIS — A419 Sepsis, unspecified organism: Secondary | ICD-10-CM | POA: Diagnosis present

## 2019-05-02 DIAGNOSIS — D72829 Elevated white blood cell count, unspecified: Secondary | ICD-10-CM | POA: Diagnosis not present

## 2019-05-02 DIAGNOSIS — Z9181 History of falling: Secondary | ICD-10-CM | POA: Diagnosis not present

## 2019-05-02 DIAGNOSIS — Z20822 Contact with and (suspected) exposure to covid-19: Secondary | ICD-10-CM | POA: Diagnosis present

## 2019-05-02 DIAGNOSIS — G9340 Encephalopathy, unspecified: Secondary | ICD-10-CM | POA: Diagnosis present

## 2019-05-02 DIAGNOSIS — B9561 Methicillin susceptible Staphylococcus aureus infection as the cause of diseases classified elsewhere: Secondary | ICD-10-CM | POA: Diagnosis present

## 2019-05-02 DIAGNOSIS — N390 Urinary tract infection, site not specified: Secondary | ICD-10-CM | POA: Diagnosis present

## 2019-05-02 DIAGNOSIS — I959 Hypotension, unspecified: Secondary | ICD-10-CM | POA: Diagnosis present

## 2019-05-02 DIAGNOSIS — F0280 Dementia in other diseases classified elsewhere without behavioral disturbance: Secondary | ICD-10-CM | POA: Diagnosis present

## 2019-05-02 LAB — ECHOCARDIOGRAM COMPLETE

## 2019-05-02 LAB — BASIC METABOLIC PANEL
Anion gap: 8 (ref 5–15)
BUN: 16 mg/dL (ref 8–23)
CO2: 27 mmol/L (ref 22–32)
Calcium: 7.9 mg/dL — ABNORMAL LOW (ref 8.9–10.3)
Chloride: 108 mmol/L (ref 98–111)
Creatinine, Ser: 0.78 mg/dL (ref 0.61–1.24)
GFR calc Af Amer: >60 mL/min (ref >60)
GFR calc non Af Amer: >60 mL/min (ref >60)
Glucose, Bld: 85 mg/dL (ref 70–99)
Potassium: 3.9 mmol/L (ref 3.5–5.1)
Sodium: 143 mmol/L (ref 135–145)

## 2019-05-02 LAB — CBC
HCT: 30.5 % — ABNORMAL LOW (ref 39.0–52.0)
Hemoglobin: 10.2 g/dL — ABNORMAL LOW (ref 13.0–17.0)
MCH: 31.4 pg (ref 26.0–34.0)
MCHC: 33.4 g/dL (ref 30.0–36.0)
MCV: 93.8 fL (ref 80.0–100.0)
Platelets: 108 10*3/uL — ABNORMAL LOW (ref 150–400)
RBC: 3.25 MIL/uL — ABNORMAL LOW (ref 4.22–5.81)
RDW: 14.3 % (ref 11.5–15.5)
WBC: 16.3 10*3/uL — ABNORMAL HIGH (ref 4.0–10.5)
nRBC: 0 % (ref 0.0–0.2)

## 2019-05-02 NOTE — Progress Notes (Signed)
Echocardiogram 2D Echocardiogram has been performed.  Warren Lacy Quana Chamberlain 05/02/2019, 10:43 AM

## 2019-05-02 NOTE — TOC Initial Note (Signed)
Transition of Care [facility]) - Initial/Assessment Note    Patient Details  Name: Kenneth Mcdaniel MRN: [id number] Date of Birth: 04/15/1956  Transition of Care [facility]) CM/SW Contact:    Doy Hutching, LCSWA Phone Number:  05/02/2019, 10:46 AM  Clinical Narrative:                 CSW spoke with pt wife Kenneth Mcdaniel via telephone. Introduced self, role, reason for call. Pt from [facility]. He has lived there for about two years, he is generally ambulatory and they assist him with all other daily cares. Pt also has support from [facility] and Palliative Care. Pt wife would like pt to return with previous services to [facility] when he is stable and ready.   CSW spoke with Kenneth Mcdaniel [phone]) through [facility], they are active with pt and following for return to Memory Care when pt is ready. They assisted CSW with obtaining contact at [facility] to f/u on what is needed for pt when he is ready to return.   CSW spoke with Kenneth Mcdaniel [phone]) at [facility]. They are also following for pt to return to their facility when hes ready. They will need a new Fl2 with discharge medications and discharge summary faxed to [phone]. She states that they cannot accept pt back on weekend due to pharmacy being closed.   All of these updates have been communicated with Kenneth Mcdaniel.  TOC team continues to follow.   Expected Discharge Plan: Memory Care Barriers to Discharge: Continued Medical Work up   Patient Goals and CMS Choice Patient states their goals for this hospitalization and ongoing recovery are:: for him to return to [facility], infection to be treated CMS Medicare.gov Compare Post Acute Care list provided to:: (n/a) Choice offered to / list presented to : Spouse  Expected Discharge Plan and Services Expected Discharge Plan: Memory Care In-house Referral: Clinical Social Work Discharge Planning Services: CM Consult Post Acute Care Choice:  Resumption of Svcs/PTA Provider Living arrangements for the past 2 months: Assisted Living Facility(Memory Care)   Prior Living Arrangements/Services Living arrangements for the past 2 months: Assisted Living Facility(Memory Care) Lives with:: Facility Resident Patient language and need for interpreter reviewed:: Yes(no needs) Do you feel safe going back to the place where you live?: Yes      Need for Family Participation in Patient Care: Yes (Comment)(assistance with daily cares; assistance with decision making) Care giver support system in place?: Yes (comment)(spouse; facility staff; hospice staff) Current home services: Hospice Criminal Activity/Legal Involvement Pertinent to Current Situation/Hospitalization: No - Comment as needed  Permission Sought/Granted Permission sought to share information with : Family Supports, Other (comment), Magazine features editor Permission granted to share information with : No(pt with fluctuating orientation)  Share Information with NAME: Kenneth Mcdaniel  Permission granted to share info w AGENCY: [employer] (contact is Kenneth Mcdaniel [phone]); [facility] (contact is Kenneth Mcdaniel [phone])  Permission granted to share info w Relationship: wife  Permission granted to share info w Contact Information: [phone]  Emotional Assessment Appearance:: Other (Comment Required(telephonic assessment with wife) Attitude/Demeanor/Rapport: (telephonic assessment with wife) Affect (typically observed): (telephonic assessment with wife) Orientation: : Fluctuating Orientation (Suspected and/or reported Sundowners), Oriented to Self(telephonic assessment with wife) Alcohol / Substance Use: Not Applicable Psych Involvement: No (comment)  Admission diagnosis:  Sepsis secondary to UTI (HCC) [A41.9, N39.0] Fall, initial encounter [W19.XXXA] Urinary tract infection without hematuria, site unspecified [N39.0] Sepsis without acute organ  dysfunction, due  to unspecified organism [facility]) [A41.9] Patient Active Problem List   Diagnosis Date Noted   Sepsis secondary to UTI ([place]) 05/01/2019   Acute encephalopathy 05/01/2019   MSSA bacteremia 05/01/2019   UTI (urinary tract infection) 11/20/2018   Seizure ([place]) 11/20/2018   Sepsis ([place]) 11/20/2018   Dementia ([place]) 03/21/2017   PCP:  Derrill Center., MD Pharmacy:  No Pharmacies Listed   Readmission Risk Interventions No flowsheet data found.

## 2019-05-02 NOTE — Social Work (Signed)
CSW acknowledging "SNF placement" consult. Pt is from [facility] which is ALF/Memory Care.   Octavio Graves, MSW, LCSWA [place] Clinical Social Work

## 2019-05-02 NOTE — Progress Notes (Signed)
   Subjective:   Pt seen at the bedside this AM. When asked if he was feeling OK he said, "yeah." We discussed plan for antibiotics. He had no complaints or concerns.  Objective:  Vital signs in last 24 hours: Vitals:   05/01/19 1423 05/01/19 1729 05/01/19 1856 05/01/19 2051  BP: 97/65 107/86 115/88 97/70  Pulse: 79 89 97 90  Resp: 18 16 18 17   Temp:   99.2 F (37.3 C) 97.7 F (36.5 C)  TempSrc:   Oral Oral  SpO2:  98% 94% 100%   Physical Exam General: pale, thin, chronically ill appearing male HEENT: NCAT CV: RRR, normal S1-S2 no murmurs rubs or gallops appreciated PULM: Auscultation bilaterally ABD: Nontender in all quadrants NEURO: Alert, but does not respond or follow commands.  No focal deficits noted.  Patient is at his baseline SKIN: stage 2 pressure ulcer on lower back, around 3 x 1 cm in size   Assessment/Plan:  Principal Problem:   Sepsis secondary to UTI [facility]) Active Problems:   Dementia (HCC)   Seizure (HCC)   Acute encephalopathy   MSSA bacteremia  Mr. Bartholomew is a 64 year old male with a past medical history significant for early onset dementia, and frequent falls who lives in a skilled nursing facility who presents today with decreased p.o. intake, AMS, and hypotension (systolics 80s) leukocytosis with left shift. This is in the context of diagnosed UTI the day before and an ED visit in which the patient received a dose of ceftriaxone and was subsequently discharged with oral Keflex. His presentation is most consistent with sepsis due to urinary source. Blood cultures have come back positive for coagulase negative staphylococcus.  #Leukocytosis #Sepsis secondary to coagulase-negative Staphylococcus: Leukocytosis is increased to 16.3 from 15.3 the day before.  ceftriaxone again in the ED. Vitial signs have improved to afebrile and sysotlic BP to 97/70. -Discussed with ID, who recommends 7 day course of ceftriaxone and would not pursue TEE. (day 2/7) -f/u TTE  results -Daily CBC -f/u urine cultures  -Continue NS 100 cc/hr  #Early Onset Dementia: Patient is nonverbal at baseline. Takes Depakote 500 mg twice daily, mirtazapine 50 mg nightly and trazodone 100 mg nightly at his facility.  Patient was initially not tolerating p.o., but per nursing, is tolerating it now.  Will start p.o. medications. -Oral Depakote 500 mg every 12 hours -Ativan 0.5mg  2 times daily PRN  #FEN/GI -Diet: Dysphagia 1 -Fluids: NS 100 cc/hr  #DVT prophylaxis -Lovenox 40 mg subq injections daily  #CODE STATUS: DNR  #Dispo: Pending medical course.  Patient will likely be transition back to memory care unit after the holiday weekend. Unable to discharge this weekend, due to the fact that the memory care unit pharmacy is closed.  4/7, MD Internal Medicine, PGY1 Pager: [phone]  05/02/2019,11:09 AM

## 2019-05-03 DIAGNOSIS — F0390 Unspecified dementia without behavioral disturbance: Secondary | ICD-10-CM | POA: Diagnosis not present

## 2019-05-03 DIAGNOSIS — R1310 Dysphagia, unspecified: Secondary | ICD-10-CM | POA: Diagnosis not present

## 2019-05-03 DIAGNOSIS — Z9181 History of falling: Secondary | ICD-10-CM | POA: Diagnosis not present

## 2019-05-03 DIAGNOSIS — A411 Sepsis due to other specified staphylococcus: Secondary | ICD-10-CM | POA: Diagnosis not present

## 2019-05-03 LAB — BASIC METABOLIC PANEL
Anion gap: 5 (ref 5–15)
BUN: 15 mg/dL (ref 8–23)
CO2: 28 mmol/L (ref 22–32)
Calcium: 7.9 mg/dL — ABNORMAL LOW (ref 8.9–10.3)
Chloride: 106 mmol/L (ref 98–111)
Creatinine, Ser: 0.67 mg/dL (ref 0.61–1.24)
GFR calc Af Amer: >60 mL/min (ref >60)
GFR calc non Af Amer: >60 mL/min (ref >60)
Glucose, Bld: 94 mg/dL (ref 70–99)
Potassium: 3.1 mmol/L — ABNORMAL LOW (ref 3.5–5.1)
Sodium: 139 mmol/L (ref 135–145)

## 2019-05-03 LAB — CBC
HCT: 31.1 % — ABNORMAL LOW (ref 39.0–52.0)
Hemoglobin: 10.9 g/dL — ABNORMAL LOW (ref 13.0–17.0)
MCH: 32 pg (ref 26.0–34.0)
MCHC: 35 g/dL (ref 30.0–36.0)
MCV: 91.2 fL (ref 80.0–100.0)
Platelets: 95 10*3/uL — ABNORMAL LOW (ref 150–400)
RBC: 3.41 MIL/uL — ABNORMAL LOW (ref 4.22–5.81)
RDW: 13.9 % (ref 11.5–15.5)
WBC: 11.6 10*3/uL — ABNORMAL HIGH (ref 4.0–10.5)
nRBC: 0 % (ref 0.0–0.2)

## 2019-05-03 LAB — CULTURE, BLOOD (ROUTINE X 2): Special Requests: ADEQUATE

## 2019-05-03 MED ORDER — POTASSIUM CHLORIDE 20 MEQ PO PACK
30.0000 meq | PACK | Freq: Four times a day (QID) | ORAL | Status: AC
  Administered 2019-05-03 (×2): 30 meq via ORAL
  Filled 2019-05-03 (×2): qty 2

## 2019-05-03 NOTE — Progress Notes (Signed)
   Subjective: HD#1   Overnight: No acute events reported, TTE performed yesterday  Today, Kenneth Mcdaniel continues to remain intermittently nonverbal   Objective:  Vital signs in last 24 hours: Vitals:   05/01/19 1856 05/01/19 2051 05/02/19 1308 05/02/19 2026  BP: 115/88 97/70 112/70 104/70  Pulse: 97 90 71 81  Resp: 18 17 17 18   Temp: 99.2 F (37.3 C) 97.7 F (36.5 C) 98.4 F (36.9 C) 98.6 F (37 C)  TempSrc: Oral Oral Axillary Axillary  SpO2: 94% 100% 99% 99%   Const: Nonverbal intermittently, sleeping and snoring, mittens in place CV: RRR, no murmurs, gallop, rub   Assessment/Plan:  Principal Problem:   Sepsis secondary to UTI [facility]) Active Problems:   Dementia (HCC)   Seizure (HCC)   Acute encephalopathy   MSSA bacteremia  Kenneth Mcdaniel is a56 year old male with a past medical history significant for early onset dementia,andfrequent falls who lives in a skilled nursing facility who presents today withdecreased p.o. intake, AMS, and hypotension (systolics 80s)leukocytosis with left shift.This is in the context of diagnosed UTI the day before and an ED visit in which the patient received a dose of ceftriaxone and was subsequently discharged with oral Keflex. His presentation is most consistent with sepsis due to urinary source. Blood cultures have come back positive for coagulase negative staphylococcus.  #Urosepsis secondary to coagulase-negative Staphylococcus:  Leukocytosis improved.  TTE performed yesterday showed an ejection fraction of 60 to 65%.  No obvious vegetation on aortic, pulmonary, mitral or tricuspid valves.  Repeat blood cultures no growth to date -Discussed with ID, who recommends 7 day course of ceftriaxone and would not pursue TEE. (day 3/7) -Daily CBC -f/uurine cultures  -Continue NS 100 cc/hr   #Early Onset Dementia:Patient is nonverbal at baseline.  -Oral Depakote 500 mg every 12 hours -Ativan 0.5mg  2 times daily  PRN   #FEN/GI -Diet: Dysphagia 1 -Fluids: NS 100 cc/hr   #DVT prophylaxis -Lovenox40  mgsubqinjections daily  #CODE STATUS:DNR  #Dispo: Pending medical course.  Patient will likely be transition back to memory care unit after the holiday weekend. Unable to discharge this weekend, due to the fact that the memory care unit pharmacy is closed.  5/7, MD 05/03/2019, 6:54 AM Pager: [phone] Internal Medicine Teaching Service

## 2019-05-04 DIAGNOSIS — F0390 Unspecified dementia without behavioral disturbance: Secondary | ICD-10-CM | POA: Diagnosis not present

## 2019-05-04 DIAGNOSIS — A411 Sepsis due to other specified staphylococcus: Secondary | ICD-10-CM | POA: Diagnosis not present

## 2019-05-04 DIAGNOSIS — R1310 Dysphagia, unspecified: Secondary | ICD-10-CM | POA: Diagnosis not present

## 2019-05-04 DIAGNOSIS — Z9181 History of falling: Secondary | ICD-10-CM | POA: Diagnosis not present

## 2019-05-04 LAB — CBC
HCT: 29.5 % — ABNORMAL LOW (ref 39.0–52.0)
Hemoglobin: 10.4 g/dL — ABNORMAL LOW (ref 13.0–17.0)
MCH: 31.6 pg (ref 26.0–34.0)
MCHC: 35.3 g/dL (ref 30.0–36.0)
MCV: 89.7 fL (ref 80.0–100.0)
Platelets: 112 10*3/uL — ABNORMAL LOW (ref 150–400)
RBC: 3.29 MIL/uL — ABNORMAL LOW (ref 4.22–5.81)
RDW: 14 % (ref 11.5–15.5)
WBC: 8.4 10*3/uL (ref 4.0–10.5)
nRBC: 0 % (ref 0.0–0.2)

## 2019-05-04 LAB — BASIC METABOLIC PANEL
Anion gap: 7 (ref 5–15)
BUN: 11 mg/dL (ref 8–23)
CO2: 26 mmol/L (ref 22–32)
Calcium: 8.2 mg/dL — ABNORMAL LOW (ref 8.9–10.3)
Chloride: 105 mmol/L (ref 98–111)
Creatinine, Ser: 0.69 mg/dL (ref 0.61–1.24)
GFR calc Af Amer: >60 mL/min (ref >60)
GFR calc non Af Amer: >60 mL/min (ref >60)
Glucose, Bld: 87 mg/dL (ref 70–99)
Potassium: 3.8 mmol/L (ref 3.5–5.1)
Sodium: 138 mmol/L (ref 135–145)

## 2019-05-04 NOTE — Progress Notes (Signed)
Paged by nursing staff regarding patient found on the floor. Examined and evaluated at bedside. Per chart review, he is at baseline non-verbal and was observed laying in bed. He is unable to express any pain. On physical exam, no evidence of trauma. Unable to perform full neuro exam due to dementia but thankfully on fall precautions and per discussion with nursing staff at bedside slid off to the floor mat. Will continue to monitor.

## 2019-05-04 NOTE — Plan of Care (Signed)

## 2019-05-04 NOTE — Progress Notes (Signed)
   05/04/19 2222  What Happened  Was fall witnessed? No  Was patient injured? No  Patient found on floor  Found by Staff-comment Maree Krabbe)  Stated prior activity ambulating-unassisted  Follow Up  MD notified Ivin Booty Lee,MD  Time MD notified 2229  Family notified Yes - comment  Time family notified 2234  Progress note created (see row info) Yes  Adult Fall Risk Assessment  Risk Factor Category (scoring not indicated) High fall risk per protocol (document High fall risk)  Patient Fall Risk Level High fall risk  Adult Fall Risk Interventions  Required Bundle Interventions *See Row Information* High fall risk - low, moderate, and high requirements implemented  Screening for Fall Injury Risk (To be completed on HIGH fall risk patients) - Assessing Need for Low Bed  Risk For Fall Injury- Low Bed Criteria Admitted as a result of a fall  Will Implement Low Bed and Floor Mats Yes  Vitals  Temp 98.6 F (37 C)  Temp Source Axillary  BP (!) 143/93  BP Location Left Arm  BP Method Automatic  Patient Position (if appropriate) Lying  Pulse Rate 63  Pulse Rate Source Dinamap  Resp 17  Oxygen Therapy  SpO2 100 %  O2 Device Room Air  Pain Assessment  Pain Scale Faces  Pain Score 0  Neurological  Neuro (WDL) X  Level of Consciousness Alert  Orientation Level Oriented to person;Disoriented to place;Disoriented to time;Disoriented to situation  Cognition Impulsive;Poor safety awareness;Memory impairment;Poor attention/concentration  Musculoskeletal  Musculoskeletal (WDL) X  Assistive Device None  Generalized Weakness Yes  Weight Bearing Restrictions No  Integumentary  Integumentary (WDL) X  Skin Color Appropriate for ethnicity  Skin Condition Dry  Skin Integrity Ecchymosis  Abrasion Location Arm  Abrasion Location Orientation Right;Left  Ecchymosis Location Head  Ecchymosis Location Orientation Right  Ecchymosis Intervention Other (Comment) (assessed)

## 2019-05-04 NOTE — Progress Notes (Signed)
   Subjective: Total duration of encounter: 3 days  Overnight: no acute events overnight  Today, Kenneth Mcdaniel was resting comfortably in bed. He woke up to light sternal rubbing. His speech was largely unintelligible. He did not appear in distress.  Objective:  Vital signs in last 24 hours: Vitals:   05/02/19 2026 05/03/19 1409 05/03/19 2140 05/04/19 0433  BP: 104/70 137/87 125/82 124/72  Pulse: 81 70 73 64  Resp: 18 18 (!) 22 17  Temp: 98.6 F (37 C) 98.6 F (37 C) 98.5 F (36.9 C) 98.1 F (36.7 C)  TempSrc: Axillary Oral Axillary Oral  SpO2: 99% 99% 100% 100%   Const: In no apparent distress, lying comfortably in bed HEENT: Atraumatic, normocephalic Resp: CTA BL, no wheezes, crackles, rhonchi CV: RRR, no murmurs, gallop, rub Abd: Bowel sounds present, nondistended, nontender to palpation Ext: No lower extremity edema, skin is warm to touch Neuro: Cranial nerves II to XII grossly intact without focal neurological deficits Skin: Warm, no rashes  Assessment/Plan:  Principal Problem:   Sepsis secondary to UTI [facility]) Active Problems:   Dementia (HCC)   Seizure (HCC)   Acute encephalopathy   MSSA bacteremia  Kenneth Mcdaniel is a68 year old male with a past medical history significant for early onset dementia,andfrequent falls who lives in a skilled nursing facility who presents today withdecreased p.o. intake, AMS, and hypotension (systolics 80s) withleukocytosis with left shift.This is in the context of diagnosed UTI the day before and an ED visit in which the patient received a dose of ceftriaxone and was subsequently discharged with oral Keflex. His presentation is most consistent with sepsis due to urinary source. Blood cultures have come back positive forcoagulase negative staphylococcus. Plan for dc back to his ALF on Monday when pharmacy is open.   #Urosepsis secondaryto coagulase-negative Staphylococcus: Leukocytosis improved.  TTE performed yesterday showed an  ejection fraction of 60 to 65%.  No obvious vegetation on aortic, pulmonary, mitral or tricuspid valves.  Repeat blood cultures no growth to date -Discussed with ID, who recommends 7 day course of ceftriaxone and would not pursue TEE. (day 4/7) -Daily CBC -f/uurinecultures -ContinueNS 100 cc/hr  #Early Onset Dementia:Patient is nonverbal at baseline.  -Oral Depakote 500 mg every 12 hours -Ativan 0.5mg  2 times daily PRN  #FEN/GI -Diet: Dysphagia 1 -Fluids: NS 100 cc/hr  #DVT prophylaxis -Lovenox40  mgsubqinjections daily  #CODE STATUS:DNR  #Dispo:Ready for d/c.Plan for patient to be transitioned back to memory care unit after the holiday weekend. Unable to discharge this weekend, due to the fact that the memory care unit pharmacy is closed.  Jenell Milliner, MD 05/04/2019, 9:02 AM Pager: [phone] Internal Medicine Teaching Service

## 2019-05-05 LAB — CBC
HCT: 30.5 % — ABNORMAL LOW (ref 39.0–52.0)
Hemoglobin: 10.9 g/dL — ABNORMAL LOW (ref 13.0–17.0)
MCH: 31.6 pg (ref 26.0–34.0)
MCHC: 35.7 g/dL (ref 30.0–36.0)
MCV: 88.4 fL (ref 80.0–100.0)
Platelets: 137 10*3/uL — ABNORMAL LOW (ref 150–400)
RBC: 3.45 MIL/uL — ABNORMAL LOW (ref 4.22–5.81)
RDW: 14.1 % (ref 11.5–15.5)
WBC: 5.3 10*3/uL (ref 4.0–10.5)
nRBC: 0 % (ref 0.0–0.2)

## 2019-05-05 MED ORDER — LINEZOLID 600 MG PO TABS: 600.0000 mg | ORAL_TABLET | Freq: Two times a day (BID) | ORAL | Status: DC

## 2019-05-05 MED ORDER — LINEZOLID 600 MG PO TABS: 600.0000 mg | ORAL_TABLET | Freq: Two times a day (BID) | ORAL | 0 refills | Status: AC

## 2019-05-05 MED ORDER — VITAMIN D-3 125 MCG (5000 UT) PO TABS: 5000.0000 [IU] | ORAL_TABLET | Freq: Every day | ORAL | Status: AC

## 2019-05-05 MED ORDER — ACETAMINOPHEN 500 MG PO TABS: 500.0000 mg | ORAL_TABLET | ORAL | Status: AC

## 2019-05-05 MED ORDER — SODIUM CHLORIDE 0.9 % IV SOLN: 2.0000 g | INTRAVENOUS | Status: DC

## 2019-05-05 MED ORDER — ZINC GLUCONATE 50 MG PO TABS: 100.0000 mg | ORAL_TABLET | Freq: Every day | ORAL | Status: AC

## 2019-05-05 NOTE — Progress Notes (Signed)
Confirmed with Dr. Dortha Schwalbe the discharge for Mr. Kenneth Mcdaniel will be cancelled due to the need for 1-2 more days of IV antibiotics.   Spoke with Erie Noe, CSW and [place], Charity fundraiser at [place] to let her know Kenneth Mcdaniel will not be returning tonight.

## 2019-05-05 NOTE — NC FL2 (Addendum)
[place] LEVEL OF CARE SCREENING TOOL     IDENTIFICATION  Patient Name: Kenneth Mcdaniel Birthdate: [birth year] Sex: male Admission Date (Current Location): 05/01/2019  [facility] and [place] Number:  [employer] and Address:  The [place], [place] [address], [place], [place] [address]      Provider Number: [id number]  Attending Physician Name and Address:  Lucious Groves, DO  Relative Name and Phone Number:  Kiyoshi Schaab, spouse [phone]    Current Level of Care: Hospital Recommended Level of Care: Assisted Living Facility([facility] Prior Approval Number:    Date Approved/Denied:   PASRR Number:    Discharge Plan: Other (Comment)([facility] [place])    Current Diagnoses: Patient Active Problem List   Diagnosis Date Noted  . Sepsis secondary to UTI ([place]) 05/01/2019  . Acute encephalopathy 05/01/2019  . MSSA bacteremia 05/01/2019  . UTI (urinary tract infection) 11/20/2018  . Seizure ([place]) 11/20/2018  . Sepsis ([place]) 11/20/2018  . Dementia ([place]) 03/21/2017    Orientation RESPIRATION BLADDER Height & Weight     Self  Normal Incontinent, External catheter(External catheter placed 05/01/19) Weight:   Height:     BEHAVIORAL SYMPTOMS/MOOD NEUROLOGICAL BOWEL NUTRITION STATUS      Continent Diet(Regular)  AMBULATORY STATUS COMMUNICATION OF NEEDS Skin   Limited Assist Verbally Normal, Other (Comment)(Ecchymosis to right head. Abrasion right, left arm.)                       Personal Care Assistance Level of Assistance  Bathing, Feeding, Dressing Bathing Assistance: Limited assistance Feeding assistance: Limited assistance Dressing Assistance: Limited assistance     Functional Limitations Info  Speech Sight Info: Adequate Hearing Info: Adequate Speech Info: Impaired    SPECIAL CARE FACTORS FREQUENCY  OT (By licensed OT), PT (By licensed PT)     PT Frequency:  5x week OT Frequency: 5x week            Contractures Contractures Info: Not present    Additional Factors Info  Code Status, Allergies Code Status Info: DNR Allergies Info: NKA           Current Medications (05/05/2019):  This is the current hospital active medication list Current Facility-Administered Medications  Medication Dose Route Frequency Provider Last Rate Last Admin  . 0.9 %  sodium chloride infusion   Intravenous Continuous Jean Rosenthal, MD 100 mL/hr at 05/05/19 0538 New Bag at 05/05/19 0538  . cefTRIAXone (ROCEPHIN) 2 g in sodium chloride 0.9 % 100 mL IVPB  2 g Intravenous Q24H Joni Reining C, DO 200 mL/hr at 05/04/19 1410 2 g at 05/04/19 1410  . divalproex (DEPAKOTE SPRINKLE) capsule 500 mg  500 mg Oral Q12H Hoffman, Erik C, DO   500 mg at 05/05/19 0945  . enoxaparin (LOVENOX) injection 40 mg  40 mg Subcutaneous Q24H Agyei, Obed K, MD   40 mg at 05/04/19 1726  . LORazepam (ATIVAN) injection 0.5 mg  0.5 mg Intravenous BID PRN Jean Rosenthal, MD   0.5 mg at 05/04/19 1922     Discharge Medications: Please see discharge summary for a list of discharge medications.  Relevant Imaging Results:  Relevant Lab Results:   Additional Information SSN [id number]  Neysa Hotter Riddick, LCSW   DISCHARGE MEDICATIONS   TAKE these medications   acetaminophen 500 MG tablet Commonly known as: TYLENOL Take 1 tablet (500 mg total) by mouth every 4 (four)  hours. Please only give while awake.   ALPRAZolam 1 MG tablet Commonly known as: XANAX Take 1 tablet (1 mg total) by mouth at bedtime.   ALPRAZolam 0.5 MG tablet Commonly known as: XANAX Take 1 tablet (0.5 mg total) by mouth 2 (two) times daily.   cefTRIAXone 2 g in sodium chloride 0.9 % 100 mL Inject 2 g into the vein daily for 2 days.   divalproex 125 MG capsule Commonly known as: DEPAKOTE SPRINKLE Take 4 capsules (500 mg total) by mouth 2 (two) times daily.   feeding supplement (GLUCERNA SHAKE) Liqd Take  237 mLs by mouth See admin instructions. Drink 1 shake (237 ml's) by mouth three times a day and with any snacks (CHOCOLATE)   linezolid 600 MG tablet Commonly known as: ZYVOX Take 1 tablet (600 mg total) by mouth every 12 (twelve) hours for 2 days. Start taking on: May 06, 2019   mirtazapine 15 MG tablet Commonly known as: REMERON Take 15 mg by mouth at bedtime.   traZODone 100 MG tablet Commonly known as: DESYREL Take 100 mg by mouth at bedtime.   Vitamin D-3 125 MCG (5000 UT) Tabs Take 5,000 Units by mouth daily.   zinc gluconate 50 MG tablet Take 2 tablets (100 mg total) by mouth daily.

## 2019-05-05 NOTE — Progress Notes (Signed)
Have paged MD multiple times concerning patient's AVS stating 2 more days of IV antibiotics needed. [facility] does not do IV antibiotic treatment.

## 2019-05-05 NOTE — Progress Notes (Signed)
Patient transported to memory care unit by [facility]. Paperwork sent with patient.

## 2019-05-05 NOTE — Discharge Summary (Addendum)
Name: Kenneth Mcdaniel MRN: [id number] DOB: December 30, 1955 64 y.o. PCP: Elijio Miles., MD  Date of Admission: 05/01/2019  7:13 AM Date of Discharge: 05/05/2019 Attending Physician: Blanch Media, MD  Discharge Diagnosis: 1.  Urosepsis secondary to coagulase-negative Staphylococcus  Discharge Medications: Allergies as of 05/05/2019   No Known Allergies     Medication List    TAKE these medications   acetaminophen 500 MG tablet Commonly known as: TYLENOL Take 1 tablet (500 mg total) by mouth every 4 (four) hours. Please only give while awake.   ALPRAZolam 1 MG tablet Commonly known as: XANAX Take 1 tablet (1 mg total) by mouth at bedtime.   ALPRAZolam 0.5 MG tablet Commonly known as: XANAX Take 1 tablet (0.5 mg total) by mouth 2 (two) times daily.   divalproex 125 MG capsule Commonly known as: DEPAKOTE SPRINKLE Take 4 capsules (500 mg total) by mouth 2 (two) times daily.   feeding supplement (GLUCERNA SHAKE) Liqd Take 237 mLs by mouth See admin instructions. Drink 1 shake (237 ml's) by mouth three times a day and with any snacks (CHOCOLATE)   linezolid 600 MG tablet Commonly known as: ZYVOX Take 1 tablet (600 mg total) by mouth every 12 (twelve) hours for 2 days. Start taking on: May 06, 2019   mirtazapine 15 MG tablet Commonly known as: REMERON Take 15 mg by mouth at bedtime.   traZODone 100 MG tablet Commonly known as: DESYREL Take 100 mg by mouth at bedtime.   Vitamin D-3 125 MCG (5000 UT) Tabs Take 5,000 Units by mouth daily.   zinc gluconate 50 MG tablet Take 2 tablets (100 mg total) by mouth daily.      Disposition and follow-up:   Kenneth Mcdaniel was discharged from [facility] [place] condition.  At the hospital follow up visit please address:  1.  Urosepsis secondary to coagulase negative Staphylococcus: Pt was treated with Ceftriaxone with the plan to complete a 7 day course on 1/20.  Due to facility's inability to  administer IV antibiotics, Infectious disease recommended oral linezolid for an additional 2 days  2.  Labs / imaging needed at time of follow-up: cbc, urinalysis if urinary symptoms   3.  Pending labs/ test needing follow-up: none  Follow-up Appointments:  Contact information for follow-up providers    Schedule an appointment as soon as possible for a visit  with Elijio Miles., MD.   Specialty: Family Medicine Why: As needed Contact information: [address] [place] [address] [phone]            Contact information for after-discharge care    Destination    [facility] .   Service: Assisted Living Contact information: [address] [place] [address] [phone]                  Hospital Course by problem list: 1.  Urosepsis secondary to coagulase-negative Staphylococcus   Kenneth Mcdaniel is a64 year old male with a past medical history significant for early onset dementia,andfrequent falls who lives in a ALF who presented to the hospital on 1/14 withdecreased p.o. intake, AMS, and hypotension (systolics 80s) withleukocytosis with left shift. Patient was found down on the floor of his facility. This is in the context of diagnosed UTI the day before and an ED visit in which the patient received a dose of ceftriaxone and was subsequently discharged with oral Keflex.  Patient initially received a CT of the head and C-spine,  which were negative for any acute intracranial process or fractures. Upon further work-up, the patient had coagulation negative Staphylococcus bacteremia, suspected from urinary source. The patient was started again on IV ceftriaxone with the plan of completing a 7-day course (stop date 1/20). The patient completed 5 days in the hospital and will finish antibiotic course at his facility.  TTE was performed and showed no evidence of endocarditis.  ID was consulted, and their recommendation was to not pursue TEE and  finished a 7-day IV antibiotic course. Due to facility's inability to administer IV antibiotics, Infectious disease recommended oral linezolid for an additional 2 days  Discharge Vitals:   BP 120/76 (BP Location: Left Arm)   Pulse 61   Temp 97.8 F (36.6 C)   Resp 20   SpO2 100%   Pertinent Labs, Studies, and Procedures:  CBC Latest Ref Rng & Units 05/05/2019 05/04/2019 05/03/2019  WBC 4.0 - 10.5 K/uL 5.3 8.4 11.6(H)  Hemoglobin 13.0 - 17.0 g/dL 10.9(L) 10.4(L) 10.9(L)  Hematocrit 39.0 - 52.0 % 30.5(L) 29.5(L) 31.1(L)  Platelets 150 - 400 K/uL 137(L) 112(L) 95(L)   BMP Latest Ref Rng & Units 05/04/2019 05/03/2019 05/02/2019  Glucose 70 - 99 mg/dL 87 94 85  BUN 8 - 23 mg/dL 11 15 16   Creatinine 0.61 - 1.24 mg/dL 0.69 0.67 0.78  Sodium 135 - 145 mmol/L 138 139 143  Potassium 3.5 - 5.1 mmol/L 3.8 3.1(L) 3.9  Chloride 98 - 111 mmol/L 105 106 108  CO2 22 - 32 mmol/L 26 28 27   Calcium 8.9 - 10.3 mg/dL 8.2(L) 7.9(L) 7.9(L)   CT Head IMPRESSION: 1. No evidence of significant acute traumatic injury to the skull, brain or cervical spine. 2. Resolving right frontal scalp hematoma which has decreased in size compared to prior study from 04/24/2019. 3. Moderate cerebral atrophy with ex vacuo dilatation of the ventricular system. Although there is no definitive imaging evidence of normal pressure hydrocephalus, given the patient's history of repeated falls and recurrent trauma, this should be considered clinically and further evaluated. 4. Mild multilevel degenerative disc disease and cervical spondylosis, as above.  CT C-spine: 1.Alignment: Normal 2. Skull base and vertebrae: No acute fracture. No primary bone lesion or focal pathologic process. 3. Soft tissues and spinal canal: No prevertebral fluid or swelling. No visible canal hematoma. 4. Disc levels: Mild multilevel degenerative disc disease, most pronounced at C2-C3, C3-C4 and C4-C5. Mild multilevel facet arthropathy. 5. Upper  chest: Unremarkable.  TTE: 1/15 IMPRESSIONS 1. Left ventricular ejection fraction, by visual estimation, is 60 to 65%. The left ventricle has normal function. There is no left ventricular hypertrophy. 2. Global right ventricle has normal systolic function.The right ventricular size is normal. No increase in right ventricular wall thickness. 3. Left atrial size was normal. 4. Right atrial size was not well visualized. 5. The mitral valve is normal in structure. No evidence of mitral valve regurgitation. 6. The tricuspid valve is grossly normal. Tricuspid valve regurgitation is trivial 7. The aortic valve was not well visualized. Aortic valve regurgitation is not visualized. No evidence of aortic valve stenosis. 8. The pulmonic valve was not well visualized. Pulmonic valve regurgitation is not visualized. 9. The inferior vena cava is normal in size with <50% respiratory variability, suggesting right atrial pressure of 8 mmHg. 10. Technically difficult study. No clear vegetation seen. If clinical suspicion for endocarditis, recommend TEE  Discharge Instructions: Discharge Instructions    Call MD for:  difficulty breathing, headache or visual disturbances   Complete  by: As directed    Call MD for:  extreme fatigue   Complete by: As directed    Call MD for:  hives   Complete by: As directed    Call MD for:  persistant dizziness or light-headedness   Complete by: As directed    Call MD for:  persistant nausea and vomiting   Complete by: As directed    Call MD for:  redness, tenderness, or signs of infection (pain, swelling, redness, odor or green/yellow discharge around incision site)   Complete by: As directed    Call MD for:  severe uncontrolled pain   Complete by: As directed    Call MD for:  temperature >100.4   Complete by: As directed    Diet - low sodium heart healthy   Complete by: As directed    Discharge instructions   Complete by: As directed    Thank you for allowing [place] to  take care of you during your hospitalization.  Below is a summary of what we treated:  1.  UTI -You had a bacteria that was growing in your urine and went to your bloodstream.  We are treating this with a 7-day course of IV antibiotics. -You will continue receiving antibiotics at your facility for 2 remaining days to complete her course.  2.  Follow-up -Please schedule an appointment with your primary care provider to follow-up your urine infection within 2 weeks of leaving the hospital.  If you have any questions or concerns, please feel free to reach out to [place].   Increase activity slowly   Complete by: As directed      Signed: [name], MD Internal Medicine, PGY1 Pager: [phone]  05/05/2019,6:29 PM

## 2019-05-05 NOTE — Progress Notes (Signed)
   Subjective:   Patient seen at the bedside this morning.  Resting comfortably in bed.  States that he is not in pain and is doing fine this morning.  No complaints at this time.  Objective: CBC Latest Ref Rng & Units 05/05/2019 05/04/2019 05/03/2019  WBC 4.0 - 10.5 K/uL 5.3 8.4 11.6(H)  Hemoglobin 13.0 - 17.0 g/dL 10.9(L) 10.4(L) 10.9(L)  Hematocrit 39.0 - 52.0 % 30.5(L) 29.5(L) 31.1(L)  Platelets 150 - 400 K/uL 137(L) 112(L) 95(L)   BMP Latest Ref Rng & Units 05/04/2019 05/03/2019 05/02/2019  Glucose 70 - 99 mg/dL 87 94 85  BUN 8 - 23 mg/dL 11 15 16   Creatinine 0.61 - 1.24 mg/dL 7.00 1.74  Sodium 135 - 145 mmol/L 138 139 143  Potassium 3.5 - 5.1 mmol/L 3.8 3.1(L) 3.9  Chloride 98 - 111 mmol/L 105 106 108  CO2 22 - 32 mmol/L 26 28 27   Calcium 8.9 - 10.3 mg/dL 8.2(L) 7.9(L) 7.9(L)   Vital signs in last 24 hours: Vitals:   05/04/19 1255 05/04/19 2000 05/04/19 2222 05/05/19 0541  BP: 125/86 131/88 (!) 143/93 124/80  Pulse: (!) 58 68 63 (!) 57  Resp: 17 20 17 16   Temp: (!) 97.5 F (36.4 C) 97.6 F (36.4 C) 98.6 F (37 C) 97.6 F (36.4 C)  TempSrc: Oral Axillary Axillary Axillary  SpO2: 100% 100% 100% 98%   Physical Exam General: Resting in bed comfortably, NAD HEENT: Well healing hematoma upon the right aspect of forehead CV: RRR, normal S1-S2 no murmurs rubs or gallops appreciated PULM: Clear to auscultation bilaterally, no crackles or wheezes appreciated ABD: Soft and nontender in all quadrants GU: No suprapubic tenderness noted NEURO: Alert, but not oriented.  No focal deficits noted  Assessment/Plan:  Principal Problem:   Sepsis secondary to UTI [facility]) Active Problems:   Dementia (HCC)   Seizure (HCC)   Acute encephalopathy   MSSA bacteremia  Kenneth Mcdaniel is a80 year old male with a past medical history significant for early onset dementia,andfrequent falls who lives in a skilled nursing facility who presents today withdecreased p.o. intake, AMS, and  hypotension (systolics 80s) withleukocytosis with left shift.This is in the context of diagnosed UTI the day before and an ED visit in which the patient received a dose of ceftriaxone and was subsequently discharged with oral Keflex. His presentation is most consistent with sepsis due to urinary source. Blood cultures positive forcoagulase negative staphylococcus. Plan for dc back to his ALF when pharmacy is open.  #Urosepsis secondaryto coagulase-negative Staphylococcus: Leukocytosis resolved.  TTE performed showed an EF of 60 to 65%.  No obvious vegetation on aortic, pulmonary, mitral or tricuspid valves.  Repeat blood cultures no growth to date. -Discussed with ID, who recommends 7 day course of ceftriaxone and would not pursue TEE. (day 5/7) -Daily CBC -ContinueNS 100 cc/hr  #Early Onset Dementia:Patient is nonverbal at baseline, however appears more conversive today. -Oral Depakote 500 mg every 12 hours -Ativan 0.5mg  2 times daily PRN  #FEN/GI -Diet: Dysphagia 1 -Fluids: NS 100 cc/hr  #DVT prophylaxis -Lovenox40  mgsubqinjections daily  #CODE STATUS:DNR  #Dispo:Ready for d/c.Plan for patient to be transitioned back to memory care unit after the holiday weekend, when the facilities pharmacy opens.  Katrinka Blazing, MD Internal Medicine, PGY1 Pager: [phone]  05/05/2019,8:15 AM

## 2019-05-05 NOTE — Significant Event (Signed)
I was notified by nursing staff that patient's memory care was unable to administer IV antibiotics.  I spoke with infectious disease who had recommended for patient to transition IV ceftriaxone to oral linezolid for an additional 2 doses to complete 7-day course of antibiotics.  Jodelle Red MD [phone] Internal Medicine Teaching Service

## 2019-05-05 NOTE — TOC Transition Note (Addendum)
Transition of Care [facility]) - CM/SW Discharge Note Patient discharged back to [facility]   Patient Details  Name: Kenneth Mcdaniel MRN: [id number] Date of Birth: 01/09/1956  [facility] of [facility] CM/SW Contact:  Cristobal Goldmann, LCSW Phone Number: 05/05/2019, 5:16 PM   Clinical Narrative:  Patient medically stable for discharge and is returning to [facility]. Facility staff person Claris Gower advised and discharge clinicals transmitted to ALF. Wife contacted regarding discharge and non-emergency ambulance transport.  Final next level of care: Memory Care([facility]) Barriers to Discharge: Barriers Resolved   Patient Goals and CMS Choice Patient states their goals for this hospitalization and ongoing recovery are:: Wife desires for patient to return to [facility] when medically stable. CMS Medicare.gov Compare Post Acute Care list provided to:: Other (Comment Required)(n/a as patient returning to ALF) Choice offered to / list presented to : NA  Discharge Placement              Patient chooses bed at: [facility]) Patient to be transferred to facility by: Ambulance Name of family member notified: Daquane Aguilar - wife - [phone] Patient and family notified of of transfer: 05/05/19  Discharge Plan and Services In-house Referral: Clinical Social Work Discharge Planning Services: CM Consult Post Acute Care Choice: Resumption of Svcs/PTA Provider                             Social Determinants of Health (SDOH) Interventions  No SDOH interventions needed or requested prior to discharge   Readmission Risk Interventions No flowsheet data found.

## 2019-05-06 LAB — CULTURE, BLOOD (ROUTINE X 2)
Culture: NO GROWTH
Culture: NO GROWTH
Special Requests: ADEQUATE

## 2019-05-09 ENCOUNTER — Emergency Department (HOSPITAL_COMMUNITY): Payer: BC Managed Care – PPO

## 2019-05-09 ENCOUNTER — Other Ambulatory Visit: Payer: Self-pay

## 2019-05-09 ENCOUNTER — Emergency Department (HOSPITAL_COMMUNITY)
Admission: EM | Admit: 2019-05-09 | Discharge: 2019-05-09 | Disposition: A | Discharge status: Home / Self Care | Payer: BC Managed Care – PPO | Attending: Emergency Medicine | Admitting: Emergency Medicine

## 2019-05-09 ENCOUNTER — Encounter (HOSPITAL_COMMUNITY): Payer: Self-pay

## 2019-05-09 DIAGNOSIS — Z79899 Other long term (current) drug therapy: Secondary | ICD-10-CM | POA: Diagnosis not present

## 2019-05-09 DIAGNOSIS — Y92129 Unspecified place in nursing home as the place of occurrence of the external cause: Secondary | ICD-10-CM | POA: Diagnosis not present

## 2019-05-09 DIAGNOSIS — S0990XA Unspecified injury of head, initial encounter: Secondary | ICD-10-CM | POA: Diagnosis present

## 2019-05-09 DIAGNOSIS — Y939 Activity, unspecified: Secondary | ICD-10-CM | POA: Diagnosis not present

## 2019-05-09 DIAGNOSIS — Y998 Other external cause status: Secondary | ICD-10-CM | POA: Diagnosis not present

## 2019-05-09 DIAGNOSIS — W19XXXA Unspecified fall, initial encounter: Secondary | ICD-10-CM | POA: Insufficient documentation

## 2019-05-09 DIAGNOSIS — F0390 Unspecified dementia without behavioral disturbance: Secondary | ICD-10-CM | POA: Insufficient documentation

## 2019-05-09 DIAGNOSIS — S0003XA Contusion of scalp, initial encounter: Secondary | ICD-10-CM | POA: Insufficient documentation

## 2019-05-09 NOTE — ED Provider Notes (Signed)
Medical screening examination/treatment/procedure(s) were conducted as a shared visit with non-physician practitioner(s) and myself.  I personally evaluated the patient during the encounter.    64 year old male with history of dementia here after witnessed fall.  At his baseline.  Will check CT of head neck and likely sent back to facility   Lorre Nick, MD 05/09/19 1239

## 2019-05-09 NOTE — ED Notes (Signed)
[facility] called for transport back to facility 

## 2019-05-09 NOTE — ED Provider Notes (Signed)
[facility] DEPT Provider Note   CSN: [id number] Arrival date & time: 05/09/19  1130     History Chief Complaint  Patient presents with   Kenneth Mcdaniel is a 64 y.o. male.  Patient is a 64 year old gentleman with history of dementia coming from nursing home for a fall.  Patient has severe dementia and cannot provide history.  Per nursing home staff he falls often.  He has been seen in this emergency department for the same multiple times.  Does not use a cane or walker for ambulation.  Reports he had a fall today and hit his head.  No loss of consciousness.        Past Medical History:  Diagnosis Date   Agitation    Early onset Alzheimer's dementia ([place])    from [facility]   Hyperlipidemia    PVD (peripheral vascular disease) [facility])     Patient Active Problem List   Diagnosis Date Noted   Sepsis secondary to UTI ([place]) 05/01/2019   Acute encephalopathy 05/01/2019   MSSA bacteremia 05/01/2019   UTI (urinary tract infection) 11/20/2018   Seizure ([place]) 11/20/2018   Sepsis ([place]) 11/20/2018   Dementia ([place]) 03/21/2017    History reviewed. No pertinent surgical history.     Family History  Problem Relation Age of Onset   Stroke Mother    Diabetes Neg Hx    Hypertension Neg Hx     Social History   Tobacco Use   Smoking status: Never Smoker   Smokeless tobacco: Never Used  Substance Use Topics   Alcohol use: Never   Drug use: No    Home Medications Prior to Admission medications   Medication Sig Start Date End Date Taking? Authorizing Provider  acetaminophen (TYLENOL) 500 MG tablet Take 1 tablet (500 mg total) by mouth every 4 (four) hours. Please only give while awake. 05/05/19  Yes [name], MD  acetaminophen (TYLENOL) 500 MG tablet Take 500 mg by mouth every 4 (four) hours as needed for mild pain or moderate pain.   Yes [provider]  ALPRAZolam (XANAX) 0.5 MG tablet Take 1  tablet (0.5 mg total) by mouth 2 (two) times daily. 11/23/18  Yes Darrick Meigs, Marge Duncans, MD  ALPRAZolam [name]) 1 MG tablet Take 1 tablet (1 mg total) by mouth at bedtime. 11/23/18  Yes [name], MD  Cholecalciferol (VITAMIN D-3) 125 MCG (5000 UT) TABS Take 5,000 Units by mouth daily. 05/05/19  Yes [name], MD  divalproex (DEPAKOTE SPRINKLE) 125 MG capsule Take 4 capsules (500 mg total) by mouth 2 (two) times daily. 12/21/18  Yes [name], MD  feeding supplement, GLUCERNA SHAKE, (GLUCERNA SHAKE) LIQD Take 237 mLs by mouth See admin instructions. Drink 1 shake (237 ml's) by mouth three times a day and with any snacks (CHOCOLATE)    Yes [provider]  linezolid (ZYVOX) 600 MG tablet Take 600 mg by mouth 2 (two) times daily.   Yes [provider]  mirtazapine (REMERON) 15 MG tablet Take 15 mg by mouth at bedtime.   Yes [provider]  traZODone (DESYREL) 100 MG tablet Take 100 mg by mouth at bedtime.  08/16/18  Yes [provider]  zinc gluconate 50 MG tablet Take 2 tablets (100 mg total) by mouth daily. 05/05/19  Yes [name], MD    Allergies    Patient has no known allergies.  Review of Systems   Review of Systems  Unable  to perform ROS: Dementia    Physical Exam Updated Vital Signs BP 104/60 (BP Location: Right Arm)    Pulse 64    Temp 98 F (36.7 C) (Oral)    Resp 16    SpO2 100%   Physical Exam Vitals and nursing note reviewed.  Constitutional:      General: He is not in acute distress.    Appearance: Normal appearance. He is not toxic-appearing or diaphoretic.     Comments: Chronically ill-appearing, elderly, cachectic   HENT:     Head: Normocephalic. Contusion present. No raccoon eyes or Battle's sign.      Nose: Nose normal. No nasal deformity or signs of injury.     Right Nostril: No epistaxis or septal hematoma.     Left Nostril: No epistaxis or septal hematoma.     Mouth/Throat:     Mouth: Mucous membranes are moist.    Eyes:     Extraocular Movements: Extraocular movements intact.     Conjunctiva/sclera: Conjunctivae normal.  Neck:     Comments: In  c collar Cardiovascular:     Rate and Rhythm: Normal rate.  Pulmonary:     Effort: Pulmonary effort is normal.  Abdominal:     General: Abdomen is flat.     Palpations: Abdomen is soft.  Musculoskeletal:        General: No swelling, tenderness or deformity.     Cervical back: No bony tenderness.     Thoracic back: No bony tenderness.     Lumbar back: No bony tenderness.     Comments: No bony tenderness throughout. No obvious deformities or signs of injury/trauma.   Skin:    General: Skin is dry.     Findings: No bruising, erythema, lesion or rash.  Neurological:     Mental Status: He is alert.     Deep Tendon Reflexes: Reflexes normal.     Comments: Oriented to self only     ED Results / Procedures / Treatments   Labs (all labs ordered are listed, but only abnormal results are displayed) Labs Reviewed - No data to display  EKG None  Radiology CT Head Wo Contrast  Result Date: 05/09/2019 CLINICAL DATA:  Fall, headache, hematoma EXAM: CT HEAD WITHOUT CONTRAST CT CERVICAL SPINE WITHOUT CONTRAST TECHNIQUE: Multidetector CT imaging of the head and cervical spine was performed following the standard protocol without intravenous contrast. Multiplanar CT image reconstructions of the cervical spine were also generated. COMPARISON:  05/01/2019 FINDINGS: CT HEAD FINDINGS Brain: No evidence of acute infarction, hemorrhage, hydrocephalus, extra-axial collection or mass lesion/mass effect. Periventricular white matter hypodensity and prominence of the lateral ventricles unchanged compared to prior examination. Vascular: No hyperdense vessel or unexpected calcification. Skull: Normal. Negative for acute fracture or focal lesion. Chronic fracture deformity of the right maxilla. Sinuses/Orbits: No acute finding. Other: None. CT CERVICAL SPINE FINDINGS Alignment:  Normal. Skull base and vertebrae: No acute fracture. No primary bone lesion or focal pathologic process. Soft tissues and spinal canal: No prevertebral fluid or swelling. No visible canal hematoma. Disc levels: Mild multilevel disc space height loss and osteophytosis. Upper chest: Negative. Other: None. IMPRESSION: 1. No acute intracranial pathology. 2. Redemonstrated prominence of the lateral ventricles, likely ex vacuo secondary to global cerebral volume loss, although normal pressure hydrocephalus could have this appearance, particularly given clinical history of frequent falling. 3. No fracture or static subluxation of the cervical spine. Electronically Signed   By: Lauralyn Primes M.D.   On: 05/09/2019 13:09  CT Cervical Spine Wo Contrast  Result Date: 05/09/2019 CLINICAL DATA:  Fall, headache, hematoma EXAM: CT HEAD WITHOUT CONTRAST CT CERVICAL SPINE WITHOUT CONTRAST TECHNIQUE: Multidetector CT imaging of the head and cervical spine was performed following the standard protocol without intravenous contrast. Multiplanar CT image reconstructions of the cervical spine were also generated. COMPARISON:  05/01/2019 FINDINGS: CT HEAD FINDINGS Brain: No evidence of acute infarction, hemorrhage, hydrocephalus, extra-axial collection or mass lesion/mass effect. Periventricular white matter hypodensity and prominence of the lateral ventricles unchanged compared to prior examination. Vascular: No hyperdense vessel or unexpected calcification. Skull: Normal. Negative for acute fracture or focal lesion. Chronic fracture deformity of the right maxilla. Sinuses/Orbits: No acute finding. Other: None. CT CERVICAL SPINE FINDINGS Alignment: Normal. Skull base and vertebrae: No acute fracture. No primary bone lesion or focal pathologic process. Soft tissues and spinal canal: No prevertebral fluid or swelling. No visible canal hematoma. Disc levels: Mild multilevel disc space height loss and osteophytosis. Upper chest: Negative.  Other: None. IMPRESSION: 1. No acute intracranial pathology. 2. Redemonstrated prominence of the lateral ventricles, likely ex vacuo secondary to global cerebral volume loss, although normal pressure hydrocephalus could have this appearance, particularly given clinical history of frequent falling. 3. No fracture or static subluxation of the cervical spine. Electronically Signed   By: Lauralyn Primes M.D.   On: 05/09/2019 13:09    Procedures Procedures (including critical care time)  Medications Ordered in ED Medications - No data to display  ED Course  I have reviewed the triage vital signs and the nursing notes.  Pertinent labs & imaging results that were available during my care of the patient were reviewed by me and considered in my medical decision making (see chart for details).  Clinical Course as of May 09 1319  Fri May 09, 2019  1221 Dementia patient from nursing home with fall and hit head. No other injuries. Head and neck CT pending. I have also ordered DME walker to help patient ambulate due to frequent falls.    [KM]  1316 Normal CT head and neck. Patient at bedside. Walker ordered. Discussed with Dr. Freida Busman. Will be d/c back to nursing home   [KM]    Clinical Course User Index [KM] Jeral Pinch   MDM Rules/Calculators/A&P                      Based on review of vitals, medical screening exam, lab work and/or imaging, there does not appear to be an acute, emergent etiology for the patient's symptoms. Counseled pt on good return precautions and encouraged both PCP and ED follow-up as needed.  Prior to discharge, I also discussed incidental imaging findings with patient in detail and advised appropriate, recommended follow-up in detail.  Clinical Impression: 1. Fall, initial encounter   2. Contusion of scalp, initial encounter     Disposition: Discharge  Prior to providing a prescription for a controlled substance, I independently reviewed the patient's recent  prescription history on the [place] Controlled Substance Reporting System. The patient had no recent or regular prescriptions and was deemed appropriate for a brief, less than 3 day prescription of narcotic for acute analgesia.  This note was prepared with assistance of Conservation officer, historic buildings. Occasional wrong-word or sound-a-like substitutions may have occurred due to the inherent limitations of voice recognition software.  Final Clinical Impression(s) / ED Diagnoses Final diagnoses:  Fall, initial encounter  Contusion of scalp, initial encounter    Rx / DC  Orders ED Discharge Orders         Ordered    For home use only DME 4 wheeled rolling walker with seat     05/09/19 1320           Jeral Pinch 05/09/19 1321    Lorre Nick, MD 05/10/19 1345

## 2019-05-09 NOTE — Discharge Instructions (Signed)
You have been given a prescription for a walker. Always use walker or other assistive device to ambulate and do not get up to walk alone. Thank you for allowing me to care for you today. Please return to the emergency department if you have new or worsening symptoms. Take your medications as instructed.

## 2019-05-09 NOTE — ED Triage Notes (Signed)
Per EMS, patient from [facility], had witnessed fall. Hx dementia. Hematoma to head. Baseline per staff. Staff denies LOC and blood thinners. Ambulatory without assistance at baseline.

## 2019-05-14 ENCOUNTER — Emergency Department (HOSPITAL_COMMUNITY): Payer: BC Managed Care – PPO

## 2019-05-14 ENCOUNTER — Emergency Department (HOSPITAL_COMMUNITY)
Admission: EM | Admit: 2019-05-14 | Discharge: 2019-05-14 | Disposition: A | Discharge status: Home / Self Care | Payer: BC Managed Care – PPO | Attending: Emergency Medicine | Admitting: Emergency Medicine

## 2019-05-14 ENCOUNTER — Encounter (HOSPITAL_COMMUNITY): Payer: Self-pay

## 2019-05-14 ENCOUNTER — Other Ambulatory Visit: Payer: Self-pay

## 2019-05-14 DIAGNOSIS — G300 Alzheimer's disease with early onset: Secondary | ICD-10-CM | POA: Diagnosis not present

## 2019-05-14 DIAGNOSIS — S0003XA Contusion of scalp, initial encounter: Secondary | ICD-10-CM | POA: Diagnosis not present

## 2019-05-14 DIAGNOSIS — W0110XA Fall on same level from slipping, tripping and stumbling with subsequent striking against unspecified object, initial encounter: Secondary | ICD-10-CM | POA: Insufficient documentation

## 2019-05-14 DIAGNOSIS — Y999 Unspecified external cause status: Secondary | ICD-10-CM | POA: Diagnosis not present

## 2019-05-14 DIAGNOSIS — Z7901 Long term (current) use of anticoagulants: Secondary | ICD-10-CM | POA: Diagnosis not present

## 2019-05-14 DIAGNOSIS — Y939 Activity, unspecified: Secondary | ICD-10-CM | POA: Diagnosis not present

## 2019-05-14 DIAGNOSIS — Y92129 Unspecified place in nursing home as the place of occurrence of the external cause: Secondary | ICD-10-CM | POA: Insufficient documentation

## 2019-05-14 DIAGNOSIS — W19XXXA Unspecified fall, initial encounter: Secondary | ICD-10-CM

## 2019-05-14 DIAGNOSIS — Z79899 Other long term (current) drug therapy: Secondary | ICD-10-CM | POA: Diagnosis not present

## 2019-05-14 DIAGNOSIS — S0990XA Unspecified injury of head, initial encounter: Secondary | ICD-10-CM | POA: Diagnosis present

## 2019-05-14 NOTE — Discharge Instructions (Addendum)
Your CT scan showed no evidence of skull fracture or bleeding on your brain.

## 2019-05-14 NOTE — ED Provider Notes (Signed)
[facility] DEPT Provider Note   CSN: [id number] Arrival date & time: 05/14/19  0119     History Chief Complaint  Patient presents with  . Fall    Kenneth Mcdaniel is a 64 y.o. male.  Patient with PMH early onset Alzheimer's Dementia, presents to the emergency department from ALF with chief complaint of fall.  He is anticoagulated on subq Lovenox.  He hit his head and has hematoma to right forehead.  Fall was unwitnessed.  Patient is unable to provide any history secondary to dementia.  Level 5 caveat applies.  The history is provided by the EMS personnel and medical records. No language interpreter was used.       Past Medical History:  Diagnosis Date  . Agitation   . Early onset Alzheimer's dementia ([place])    from [facility]  . Hyperlipidemia   . PVD (peripheral vascular disease) [facility])     Patient Active Problem List   Diagnosis Date Noted  . Sepsis secondary to UTI ([place]) 05/01/2019  . Acute encephalopathy 05/01/2019  . MSSA bacteremia 05/01/2019  . UTI (urinary tract infection) 11/20/2018  . Seizure ([place]) 11/20/2018  . Sepsis ([place]) 11/20/2018  . Dementia ([place]) 03/21/2017    No past surgical history on file.     Family History  Problem Relation Age of Onset  . Stroke Mother   . Diabetes Neg Hx   . Hypertension Neg Hx     Social History   Tobacco Use  . Smoking status: Never Smoker  . Smokeless tobacco: Never Used  Substance Use Topics  . Alcohol use: Never  . Drug use: No    Home Medications Prior to Admission medications   Medication Sig Start Date End Date Taking? Authorizing Provider  acetaminophen (TYLENOL) 500 MG tablet Take 1 tablet (500 mg total) by mouth every 4 (four) hours. Please only give while awake. 05/05/19   [name], MD  acetaminophen (TYLENOL) 500 MG tablet Take 500 mg by mouth every 4 (four) hours as needed for mild pain or moderate pain.    [provider]  ALPRAZolam [name])  0.5 MG tablet Take 1 tablet (0.5 mg total) by mouth 2 (two) times daily. 11/23/18   [name], MD  ALPRAZolam [name]) 1 MG tablet Take 1 tablet (1 mg total) by mouth at bedtime. 11/23/18   [name], MD  Cholecalciferol (VITAMIN D-3) 125 MCG (5000 UT) TABS Take 5,000 Units by mouth daily. 05/05/19   [name], MD  divalproex (DEPAKOTE SPRINKLE) 125 MG capsule Take 4 capsules (500 mg total) by mouth 2 (two) times daily. 12/21/18   [name], MD  feeding supplement, GLUCERNA SHAKE, (GLUCERNA SHAKE) LIQD Take 237 mLs by mouth See admin instructions. Drink 1 shake (237 ml's) by mouth three times a day and with any snacks (CHOCOLATE)     [provider]  linezolid (ZYVOX) 600 MG tablet Take 600 mg by mouth 2 (two) times daily.    [provider]  mirtazapine (REMERON) 15 MG tablet Take 15 mg by mouth at bedtime.    [provider]  traZODone (DESYREL) 100 MG tablet Take 100 mg by mouth at bedtime.  08/16/18   [provider]  zinc gluconate 50 MG tablet Take 2 tablets (100 mg total) by mouth daily. 05/05/19   [name], MD    Allergies    Patient has no known allergies.  Review of Systems   Review of Systems  Unable  to perform ROS: Dementia    Physical Exam Updated Vital Signs BP 110/76 (BP Location: Left Arm)   Pulse 60   Temp 97.6 F (36.4 C) (Oral)   Resp 13   SpO2 100%   Physical Exam Vitals and nursing note reviewed.  Constitutional:      General: He is not in acute distress.    Appearance: He is well-developed. He is not ill-appearing.  HENT:     Head: Normocephalic and atraumatic.     Comments: Right forehead hematoma No laceration Eyes:     Conjunctiva/sclera: Conjunctivae normal.  Cardiovascular:     Rate and Rhythm: Normal rate.  Pulmonary:     Effort: Pulmonary effort is normal. No respiratory distress.  Abdominal:     General: There is no distension.  Musculoskeletal:     Cervical back: Neck supple.      Comments: Moves all extremities  Skin:    General: Skin is warm and dry.     Comments: Scrapes and abrasions on feet and legs  Neurological:     Mental Status: He is alert and oriented to person, place, and time.  Psychiatric:        Mood and Affect: Mood normal.        Behavior: Behavior normal.     ED Results / Procedures / Treatments   Labs (all labs ordered are listed, but only abnormal results are displayed) Labs Reviewed - No data to display  EKG None  Radiology CT Head Wo Contrast  Result Date: 05/14/2019 CLINICAL DATA:  Fall on blood thinners, unwitnessed, right forehead hematoma EXAM: CT HEAD WITHOUT CONTRAST CT CERVICAL SPINE WITHOUT CONTRAST TECHNIQUE: Multidetector CT imaging of the head and cervical spine was performed following the standard protocol without intravenous contrast. Multiplanar CT image reconstructions of the cervical spine were also generated. COMPARISON:  CT head and cervical spine 05/09/2019 FINDINGS: CT HEAD FINDINGS Brain: No evidence of acute infarction, hemorrhage, hydrocephalus, extra-axial collection or mass lesion/mass effect. Symmetric prominence of the ventricles, cisterns and sulci compatible with parenchymal volume loss. Dilatation of the ventricles is somewhat out of proportion to the sulci. Extensive patchy and confluent areas of white matter hypoattenuation are most compatible with chronic microvascular angiopathy. Vascular: Atherosclerotic calcification of the carotid siphons. No hyperdense vessel. Skull: There is right frontal and supraorbital scalp swelling and thickening. Small left occipital scalp contusion is noted as well. No subjacent calvarial or visible acute facial bone fracture is seen. Postsurgical changes from prior surgical repair of the right inferior orbital rim and maxillary sinus. Anterior maxillary sinus. Remote posttraumatic deformity of the nasal bones is similar to prior. Sinuses/Orbits: No retro septal gas or stranding in  either orbit. Globes appear normal and symmetric. The lenses remain orthotopic. Other: None CT CERVICAL SPINE FINDINGS Alignment: Preservation of the normal cervical lordosis without traumatic listhesis. No abnormal facet widening. Normal alignment of the craniocervical and atlantoaxial articulations. Skull base and vertebrae: No acute fracture. No primary bone lesion or focal pathologic process. Soft tissues and spinal canal: No pre or paravertebral fluid or swelling. No visible canal hematoma. Disc levels: Multilevel intervertebral disc height loss with spondylitic endplate changes. Large disc osteophyte complexes at C2-3 and C3-4 result in at least moderate canal stenosis. Uncinate spurring and facet hypertrophic changes result in moderate bilateral neural foraminal narrowing at C3-4 as well. Upper chest: No acute abnormality in the upper chest or imaged lung apices. Other: None IMPRESSION: 1. No acute intracranial abnormality. 2. Right frontal and supraorbital  scalp swelling. Small left occipital scalp contusion. No subjacent calvarial or facial bone fracture identified. 3. Extensive volume loss and chronic microvascular angiopathy is similar to prior. Dilatation of the ventricles out of proportion to the sulci could reflect some underlying normal pressure hydrocephaly. 4. Remote posttraumatic deformity of the nasal bones. 5. Postsurgical changes from prior right orbital rim reconstruction. 6. No acute fracture of the cervical spine. 7. Multilevel intervertebral disc height loss with spondylitic changes. Disc osteophyte complexes at C2-3 and C3-4 results in at least moderate spinal canal stenosis. Moderate bilateral neural foraminal narrowing at C3-4 as well. Electronically Signed   By: Kreg Shropshire M.D.   On: 05/14/2019 03:21   CT Cervical Spine Wo Contrast  Result Date: 05/14/2019 CLINICAL DATA:  Fall on blood thinners, unwitnessed, right forehead hematoma EXAM: CT HEAD WITHOUT CONTRAST CT CERVICAL SPINE  WITHOUT CONTRAST TECHNIQUE: Multidetector CT imaging of the head and cervical spine was performed following the standard protocol without intravenous contrast. Multiplanar CT image reconstructions of the cervical spine were also generated. COMPARISON:  CT head and cervical spine 05/09/2019 FINDINGS: CT HEAD FINDINGS Brain: No evidence of acute infarction, hemorrhage, hydrocephalus, extra-axial collection or mass lesion/mass effect. Symmetric prominence of the ventricles, cisterns and sulci compatible with parenchymal volume loss. Dilatation of the ventricles is somewhat out of proportion to the sulci. Extensive patchy and confluent areas of white matter hypoattenuation are most compatible with chronic microvascular angiopathy. Vascular: Atherosclerotic calcification of the carotid siphons. No hyperdense vessel. Skull: There is right frontal and supraorbital scalp swelling and thickening. Small left occipital scalp contusion is noted as well. No subjacent calvarial or visible acute facial bone fracture is seen. Postsurgical changes from prior surgical repair of the right inferior orbital rim and maxillary sinus. Anterior maxillary sinus. Remote posttraumatic deformity of the nasal bones is similar to prior. Sinuses/Orbits: No retro septal gas or stranding in either orbit. Globes appear normal and symmetric. The lenses remain orthotopic. Other: None CT CERVICAL SPINE FINDINGS Alignment: Preservation of the normal cervical lordosis without traumatic listhesis. No abnormal facet widening. Normal alignment of the craniocervical and atlantoaxial articulations. Skull base and vertebrae: No acute fracture. No primary bone lesion or focal pathologic process. Soft tissues and spinal canal: No pre or paravertebral fluid or swelling. No visible canal hematoma. Disc levels: Multilevel intervertebral disc height loss with spondylitic endplate changes. Large disc osteophyte complexes at C2-3 and C3-4 result in at least moderate  canal stenosis. Uncinate spurring and facet hypertrophic changes result in moderate bilateral neural foraminal narrowing at C3-4 as well. Upper chest: No acute abnormality in the upper chest or imaged lung apices. Other: None IMPRESSION: 1. No acute intracranial abnormality. 2. Right frontal and supraorbital scalp swelling. Small left occipital scalp contusion. No subjacent calvarial or facial bone fracture identified. 3. Extensive volume loss and chronic microvascular angiopathy is similar to prior. Dilatation of the ventricles out of proportion to the sulci could reflect some underlying normal pressure hydrocephaly. 4. Remote posttraumatic deformity of the nasal bones. 5. Postsurgical changes from prior right orbital rim reconstruction. 6. No acute fracture of the cervical spine. 7. Multilevel intervertebral disc height loss with spondylitic changes. Disc osteophyte complexes at C2-3 and C3-4 results in at least moderate spinal canal stenosis. Moderate bilateral neural foraminal narrowing at C3-4 as well. Electronically Signed   By: Kreg Shropshire M.D.   On: 05/14/2019 03:21    Procedures Procedures (including critical care time)  Medications Ordered in ED Medications - No data to display  ED Course  I have reviewed the triage vital signs and the nursing notes.  Pertinent labs & imaging results that were available during my care of the patient were reviewed by me and considered in my medical decision making (see chart for details).    MDM Rules/Calculators/A&P                      Patient with fall at ALF.  Mild hematoma to right forehead.  He is anticoagulated.  CTs shows no acute traumatic findings.   DC back to ALF with PCP follow-up.    Final Clinical Impression(s) / ED Diagnoses Final diagnoses:  Fall, initial encounter  Contusion of scalp, initial encounter    Rx / DC Orders ED Discharge Orders    None       Roxy Horseman, PA-C 05/14/19 0345    Dione Booze,  MD 05/14/19 [id number]

## 2019-05-14 NOTE — ED Triage Notes (Signed)
Per EMS, patient is on lovenox SQ. EMS brought gold DNR form with patient.

## 2019-05-14 NOTE — ED Triage Notes (Signed)
Called report to Trenton Founds, Charity fundraiser at [facility]. Patient stable for transfer back to facility. Called EMS for transport.

## 2019-05-14 NOTE — ED Triage Notes (Signed)
Patient arrived via EMS. Per EMS, pt had unwitnessed fall at ALF. Facility did rounds at 2330 and patient was resting in bed. Facility found patient on floor at 0026. Patient alert to self only which is baseline per facility. Hematoma noted over right frontal area. No bleeding noted. ALF contacted family and Hospice prior to calling EMS. Hospice instructed facility to call EMS.

## 2019-05-15 ENCOUNTER — Emergency Department (HOSPITAL_COMMUNITY): Payer: BC Managed Care – PPO

## 2019-05-15 ENCOUNTER — Emergency Department (HOSPITAL_COMMUNITY)
Admission: EM | Admit: 2019-05-15 | Discharge: 2019-05-15 | Disposition: A | Discharge status: Home / Self Care | Payer: BC Managed Care – PPO | Attending: Emergency Medicine | Admitting: Emergency Medicine

## 2019-05-15 ENCOUNTER — Encounter (HOSPITAL_COMMUNITY): Payer: Self-pay

## 2019-05-15 ENCOUNTER — Encounter (HOSPITAL_COMMUNITY): Payer: Self-pay | Admitting: *Deleted

## 2019-05-15 ENCOUNTER — Other Ambulatory Visit: Payer: Self-pay

## 2019-05-15 ENCOUNTER — Emergency Department (HOSPITAL_COMMUNITY)
Admission: EM | Admit: 2019-05-15 | Discharge: 2019-05-16 | Disposition: A | Discharge status: Home / Self Care | Payer: BC Managed Care – PPO | Source: Home / Self Care | Attending: Emergency Medicine | Admitting: Emergency Medicine

## 2019-05-15 DIAGNOSIS — G309 Alzheimer's disease, unspecified: Secondary | ICD-10-CM | POA: Insufficient documentation

## 2019-05-15 DIAGNOSIS — R197 Diarrhea, unspecified: Secondary | ICD-10-CM | POA: Diagnosis not present

## 2019-05-15 DIAGNOSIS — R519 Headache, unspecified: Secondary | ICD-10-CM | POA: Diagnosis not present

## 2019-05-15 DIAGNOSIS — Y939 Activity, unspecified: Secondary | ICD-10-CM | POA: Insufficient documentation

## 2019-05-15 DIAGNOSIS — I739 Peripheral vascular disease, unspecified: Secondary | ICD-10-CM | POA: Insufficient documentation

## 2019-05-15 DIAGNOSIS — W1830XA Fall on same level, unspecified, initial encounter: Secondary | ICD-10-CM | POA: Diagnosis not present

## 2019-05-15 DIAGNOSIS — Z79899 Other long term (current) drug therapy: Secondary | ICD-10-CM | POA: Insufficient documentation

## 2019-05-15 DIAGNOSIS — R10814 Left lower quadrant abdominal tenderness: Secondary | ICD-10-CM | POA: Diagnosis not present

## 2019-05-15 DIAGNOSIS — Y92129 Unspecified place in nursing home as the place of occurrence of the external cause: Secondary | ICD-10-CM | POA: Diagnosis not present

## 2019-05-15 DIAGNOSIS — Y999 Unspecified external cause status: Secondary | ICD-10-CM | POA: Diagnosis not present

## 2019-05-15 DIAGNOSIS — R296 Repeated falls: Secondary | ICD-10-CM

## 2019-05-15 DIAGNOSIS — W19XXXA Unspecified fall, initial encounter: Secondary | ICD-10-CM

## 2019-05-15 DIAGNOSIS — R10813 Right lower quadrant abdominal tenderness: Secondary | ICD-10-CM | POA: Insufficient documentation

## 2019-05-15 DIAGNOSIS — S0181XA Laceration without foreign body of other part of head, initial encounter: Secondary | ICD-10-CM

## 2019-05-15 DIAGNOSIS — S0990XA Unspecified injury of head, initial encounter: Secondary | ICD-10-CM

## 2019-05-15 LAB — C DIFFICILE QUICK SCREEN W PCR REFLEX
C Diff antigen: NEGATIVE
C Diff interpretation: NOT DETECTED
C Diff toxin: NEGATIVE

## 2019-05-15 LAB — URINALYSIS, ROUTINE W REFLEX MICROSCOPIC
Bilirubin Urine: NEGATIVE
Glucose, UA: NEGATIVE mg/dL
Hgb urine dipstick: NEGATIVE
Ketones, ur: 5 mg/dL — AB
Leukocytes,Ua: NEGATIVE
Nitrite: NEGATIVE
Protein, ur: NEGATIVE mg/dL
Specific Gravity, Urine: 1.027 (ref 1.005–1.030)
pH: 5 (ref 5.0–8.0)

## 2019-05-15 LAB — CBC WITH DIFFERENTIAL/PLATELET
Abs Immature Granulocytes: 0.03 10*3/uL (ref 0.00–0.07)
Basophils Absolute: 0 10*3/uL (ref 0.0–0.1)
Basophils Relative: 1 %
Eosinophils Absolute: 0.1 10*3/uL (ref 0.0–0.5)
Eosinophils Relative: 2 %
HCT: 33.5 % — ABNORMAL LOW (ref 39.0–52.0)
Hemoglobin: 11.3 g/dL — ABNORMAL LOW (ref 13.0–17.0)
Immature Granulocytes: 1 %
Lymphocytes Relative: 39 %
Lymphs Abs: 2.3 10*3/uL (ref 0.7–4.0)
MCH: 32.8 pg (ref 26.0–34.0)
MCHC: 33.7 g/dL (ref 30.0–36.0)
MCV: 97.1 fL (ref 80.0–100.0)
Monocytes Absolute: 0.6 10*3/uL (ref 0.1–1.0)
Monocytes Relative: 11 %
Neutro Abs: 2.7 10*3/uL (ref 1.7–7.7)
Neutrophils Relative %: 46 %
Platelets: 320 10*3/uL (ref 150–400)
RBC: 3.45 MIL/uL — ABNORMAL LOW (ref 4.22–5.81)
RDW: 15.9 % — ABNORMAL HIGH (ref 11.5–15.5)
WBC: 5.8 10*3/uL (ref 4.0–10.5)
nRBC: 0 % (ref 0.0–0.2)

## 2019-05-15 LAB — COMPREHENSIVE METABOLIC PANEL
ALT: 27 U/L (ref 0–44)
AST: 23 U/L (ref 15–41)
Albumin: 3 g/dL — ABNORMAL LOW (ref 3.5–5.0)
Alkaline Phosphatase: 48 U/L (ref 38–126)
Anion gap: 9 (ref 5–15)
BUN: 17 mg/dL (ref 8–23)
CO2: 27 mmol/L (ref 22–32)
Calcium: 8.6 mg/dL — ABNORMAL LOW (ref 8.9–10.3)
Chloride: 105 mmol/L (ref 98–111)
Creatinine, Ser: 0.8 mg/dL (ref 0.61–1.24)
GFR calc Af Amer: >60 mL/min (ref >60)
GFR calc non Af Amer: >60 mL/min (ref >60)
Glucose, Bld: 91 mg/dL (ref 70–99)
Potassium: 3.3 mmol/L — ABNORMAL LOW (ref 3.5–5.1)
Sodium: 141 mmol/L (ref 135–145)
Total Bilirubin: 0.9 mg/dL (ref 0.3–1.2)
Total Protein: 5.9 g/dL — ABNORMAL LOW (ref 6.5–8.1)

## 2019-05-15 MED ORDER — POTASSIUM CHLORIDE CRYS ER 20 MEQ PO TBCR
40.0000 meq | EXTENDED_RELEASE_TABLET | Freq: Once | ORAL | Status: AC
  Administered 2019-05-15: 11:00:00 40 meq via ORAL
  Filled 2019-05-15: qty 2

## 2019-05-15 NOTE — ED Provider Notes (Signed)
[facility]-EMERGENCY DEPT Provider Note   CSN: [id number] Arrival date & time: 05/15/19  0141     History Chief Complaint  Patient presents with  . Fall    Lance SellMichael R Bankhead is a 64 y.o. male with a history of Alzheimer's disease, HLD, and PVD who presents to the emergency department by EMS [facility] after an unwitnessed fall.  EMS reports that the patient was also seen on January 26 for a fall.  They were called to the facility after an unwitnessed fall.  The patient has an head injury to the right forehead.  He was noted to be oriented to self.  Staff were unable to report to EMS that this was the patient's baseline.  Patient was noted to be able to open his eyes to voice, but is drowsy.  He is a DNR.   Level 5 caveat secondary to dementia.  The history is provided by the EMS personnel and the nursing home. No language interpreter was used.       Past Medical History:  Diagnosis Date  . Agitation   . Early onset Alzheimer's dementia (HCC)    [facility]  . Hyperlipidemia   . PVD (peripheral vascular disease) [facility](HCC)     Patient Active Problem List   Diagnosis Date Noted  . Sepsis secondary to UTI (HCC) 05/01/2019  . Acute encephalopathy 05/01/2019  . MSSA bacteremia 05/01/2019  . UTI (urinary tract infection) 11/20/2018  . Seizure (HCC) 11/20/2018  . Sepsis (HCC) 11/20/2018  . Dementia (HCC) 03/21/2017    History reviewed. No pertinent surgical history.     Family History  Problem Relation Age of Onset  . Stroke Mother   . Diabetes Neg Hx   . Hypertension Neg Hx     Social History   Tobacco Use  . Smoking status: Never Smoker  . Smokeless tobacco: Never Used  Substance Use Topics  . Alcohol use: Never  . Drug use: No    Home Medications Prior to Admission medications   Medication Sig Start Date End Date Taking? Authorizing Provider  acetaminophen (TYLENOL) 500 MG tablet Take 1 tablet (500 mg total) by mouth  every 4 (four) hours. Please only give while awake. 05/05/19  Yes Kirt BoysAlexander, Andrew, MD  ALPRAZolam Prudy Feeler(XANAX) 0.5 MG tablet Take 1 tablet (0.5 mg total) by mouth 2 (two) times daily. 11/23/18  Yes Sharl MaLama, Sarina IllGagan S, MD  ALPRAZolam Prudy Feeler(XANAX) 1 MG tablet Take 1 tablet (1 mg total) by mouth at bedtime. 11/23/18  Yes Meredeth IdeLama, Gagan S, MD  Cholecalciferol (VITAMIN D-3) 125 MCG (5000 UT) TABS Take 5,000 Units by mouth daily. 05/05/19  Yes Kirt BoysAlexander, Andrew, MD  divalproex (DEPAKOTE SPRINKLE) 125 MG capsule Take 4 capsules (500 mg total) by mouth 2 (two) times daily. 12/21/18  Yes Margarita Grizzleay, Danielle, MD  feeding supplement, GLUCERNA SHAKE, (GLUCERNA SHAKE) LIQD Take 237 mLs by mouth See admin instructions. Drink 1 shake (237 ml's) by mouth three times a day and with any snacks (CHOCOLATE)    Yes [provider]  linezolid (ZYVOX) 600 MG tablet Take 600 mg by mouth 2 (two) times daily.   Yes [provider]  mirtazapine (REMERON) 15 MG tablet Take 15 mg by mouth at bedtime.   Yes [provider]  traZODone (DESYREL) 100 MG tablet Take 100 mg by mouth at bedtime.  08/16/18  Yes [provider]  zinc gluconate 50 MG tablet Take 2 tablets (100 mg total) by mouth daily. 05/05/19  Yes [name], MD    Allergies    Patient has no known allergies.  Review of Systems   Review of Systems  Unable to perform ROS: Dementia    Physical Exam Updated Vital Signs BP 116/75   Pulse 65   Temp 97.6 F (36.4 C) (Oral)   Resp 15   Wt 47.2 kg   SpO2 100%   BMI 16.79 kg/m   Physical Exam Constitutional:      General: He is not in acute distress.    Appearance: He is not toxic-appearing.     Comments: Frail male.  HENT:     Head:     Comments: Small hematoma and 0.5 cm laceration noted to the right forehead.  Wound is hemostatic.  No obvious foreign bodies.  No battle sign or raccoon eyes.  No hemotympanum bilaterally.  No focal tenderness palpation to the face. Eyes:     Extraocular  Movements: Extraocular movements intact.     Pupils: Pupils are equal, round, and reactive to light.  Pulmonary:     Effort: No respiratory distress.     Breath sounds: No stridor. No wheezing, rhonchi or rales.     Comments: No tenderness to palpation or wounds noted to the chest wall. Chest:     Chest wall: No tenderness.  Abdominal:     General: There is no distension.     Palpations: There is no mass.     Tenderness: There is abdominal tenderness. There is no right CVA tenderness, left CVA tenderness, guarding or rebound.     Hernia: No hernia is present.     Comments: Groans with palpation of the bilateral lower abdomen intermittently.  Abdomen is soft and nondistended.  Musculoskeletal:     Comments: Head to toe exam.  He is tender to palpation to the right femur and left elbow.  When asked what hurts, he points to his left elbow.  No tenderness to the cervical, thoracic, or lumbar spinous processes.  Neurological:     Mental Status: He is alert.     Comments: Oriented to self only.  Somnolent, but arouses to loud voice.  Moves all 4 extremities.  Good strength against resistance.  Intermittently follows commands.     ED Results / Procedures / Treatments   Labs (all labs ordered are listed, but only abnormal results are displayed) Labs Reviewed  CBC WITH DIFFERENTIAL/PLATELET - Abnormal; Notable for the following components:      Result Value   RBC 3.45 (*)    Hemoglobin 11.3 (*)    HCT 33.5 (*)    RDW 15.9 (*)    All other components within normal limits  COMPREHENSIVE METABOLIC PANEL - Abnormal; Notable for the following components:   Potassium 3.3 (*)    Calcium 8.6 (*)    Total Protein 5.9 (*)    Albumin 3.0 (*)    All other components within normal limits  C DIFFICILE QUICK SCREEN W PCR REFLEX  URINALYSIS, ROUTINE W REFLEX MICROSCOPIC    EKG None  Radiology DG Chest 2 View  Result Date: 05/15/2019 CLINICAL DATA:  Fall EXAM: CHEST - 2 VIEW COMPARISON:   03/03/2019 FINDINGS: Heart and mediastinal contours are within normal limits. No focal opacities or effusions. No acute bony abnormality. IMPRESSION: No active cardiopulmonary disease. Electronically Signed   By: Charlett Nose M.D.   On: 05/15/2019 03:11   DG Elbow Complete Left  Result Date: 05/15/2019 CLINICAL DATA:  Fall EXAM: LEFT ELBOW -  COMPLETE 3+ VIEW COMPARISON:  None. FINDINGS: There is no evidence of fracture, dislocation, or joint effusion. There is no evidence of arthropathy or other focal bone abnormality. Soft tissues are unremarkable. IMPRESSION: Negative. Electronically Signed   By: Charlett Nose M.D.   On: 05/15/2019 03:09   CT Head Wo Contrast  Result Date: 05/15/2019 CLINICAL DATA:  Headache EXAM: CT HEAD WITHOUT CONTRAST TECHNIQUE: Contiguous axial images were obtained from the base of the skull through the vertex without intravenous contrast. COMPARISON:  05/14/2019 FINDINGS: Brain: There is atrophy and chronic small vessel disease changes. No acute intracranial abnormality. Specifically, no hemorrhage, hydrocephalus, mass lesion, acute infarction, or significant intracranial injury. Vascular: No hyperdense vessel or unexpected calcification. Skull: No acute calvarial abnormality. Sinuses/Orbits: Visualized paranasal sinuses and mastoids clear. Orbital soft tissues unremarkable. Other: Soft tissue swelling and laceration in the right forehead. IMPRESSION: Atrophy, chronic microvascular disease. No acute intracranial abnormality. Electronically Signed   By: Charlett Nose M.D.   On: 05/15/2019 03:21   CT Head Wo Contrast  Result Date: 05/14/2019 CLINICAL DATA:  Fall on blood thinners, unwitnessed, right forehead hematoma EXAM: CT HEAD WITHOUT CONTRAST CT CERVICAL SPINE WITHOUT CONTRAST TECHNIQUE: Multidetector CT imaging of the head and cervical spine was performed following the standard protocol without intravenous contrast. Multiplanar CT image reconstructions of the cervical spine  were also generated. COMPARISON:  CT head and cervical spine 05/09/2019 FINDINGS: CT HEAD FINDINGS Brain: No evidence of acute infarction, hemorrhage, hydrocephalus, extra-axial collection or mass lesion/mass effect. Symmetric prominence of the ventricles, cisterns and sulci compatible with parenchymal volume loss. Dilatation of the ventricles is somewhat out of proportion to the sulci. Extensive patchy and confluent areas of white matter hypoattenuation are most compatible with chronic microvascular angiopathy. Vascular: Atherosclerotic calcification of the carotid siphons. No hyperdense vessel. Skull: There is right frontal and supraorbital scalp swelling and thickening. Small left occipital scalp contusion is noted as well. No subjacent calvarial or visible acute facial bone fracture is seen. Postsurgical changes from prior surgical repair of the right inferior orbital rim and maxillary sinus. Anterior maxillary sinus. Remote posttraumatic deformity of the nasal bones is similar to prior. Sinuses/Orbits: No retro septal gas or stranding in either orbit. Globes appear normal and symmetric. The lenses remain orthotopic. Other: None CT CERVICAL SPINE FINDINGS Alignment: Preservation of the normal cervical lordosis without traumatic listhesis. No abnormal facet widening. Normal alignment of the craniocervical and atlantoaxial articulations. Skull base and vertebrae: No acute fracture. No primary bone lesion or focal pathologic process. Soft tissues and spinal canal: No pre or paravertebral fluid or swelling. No visible canal hematoma. Disc levels: Multilevel intervertebral disc height loss with spondylitic endplate changes. Large disc osteophyte complexes at C2-3 and C3-4 result in at least moderate canal stenosis. Uncinate spurring and facet hypertrophic changes result in moderate bilateral neural foraminal narrowing at C3-4 as well. Upper chest: No acute abnormality in the upper chest or imaged lung apices. Other:  None IMPRESSION: 1. No acute intracranial abnormality. 2. Right frontal and supraorbital scalp swelling. Small left occipital scalp contusion. No subjacent calvarial or facial bone fracture identified. 3. Extensive volume loss and chronic microvascular angiopathy is similar to prior. Dilatation of the ventricles out of proportion to the sulci could reflect some underlying normal pressure hydrocephaly. 4. Remote posttraumatic deformity of the nasal bones. 5. Postsurgical changes from prior right orbital rim reconstruction. 6. No acute fracture of the cervical spine. 7. Multilevel intervertebral disc height loss with spondylitic changes. Disc osteophyte complexes at  C2-3 and C3-4 results in at least moderate spinal canal stenosis. Moderate bilateral neural foraminal narrowing at C3-4 as well. Electronically Signed   By: Lovena Le M.D.   On: 05/14/2019 03:21   CT Cervical Spine Wo Contrast  Result Date: 05/15/2019 CLINICAL DATA:  Fall EXAM: CT CERVICAL SPINE WITHOUT CONTRAST TECHNIQUE: Multidetector CT imaging of the cervical spine was performed without intravenous contrast. Multiplanar CT image reconstructions were also generated. COMPARISON:  05/13/2022 FINDINGS: Alignment: Normal. Skull base and vertebrae: No acute fracture. No primary bone lesion or focal pathologic process. Soft tissues and spinal canal: No prevertebral fluid or swelling. No visible canal hematoma. Disc levels:  Diffuse degenerative disc disease and facet disease Upper chest: No acute findings Other: None IMPRESSION: No acute bony abnormality in the cervical spine. Electronically Signed   By: Rolm Baptise M.D.   On: 05/15/2019 03:23   CT Cervical Spine Wo Contrast  Result Date: 05/14/2019 CLINICAL DATA:  Fall on blood thinners, unwitnessed, right forehead hematoma EXAM: CT HEAD WITHOUT CONTRAST CT CERVICAL SPINE WITHOUT CONTRAST TECHNIQUE: Multidetector CT imaging of the head and cervical spine was performed following the standard  protocol without intravenous contrast. Multiplanar CT image reconstructions of the cervical spine were also generated. COMPARISON:  CT head and cervical spine 05/09/2019 FINDINGS: CT HEAD FINDINGS Brain: No evidence of acute infarction, hemorrhage, hydrocephalus, extra-axial collection or mass lesion/mass effect. Symmetric prominence of the ventricles, cisterns and sulci compatible with parenchymal volume loss. Dilatation of the ventricles is somewhat out of proportion to the sulci. Extensive patchy and confluent areas of white matter hypoattenuation are most compatible with chronic microvascular angiopathy. Vascular: Atherosclerotic calcification of the carotid siphons. No hyperdense vessel. Skull: There is right frontal and supraorbital scalp swelling and thickening. Small left occipital scalp contusion is noted as well. No subjacent calvarial or visible acute facial bone fracture is seen. Postsurgical changes from prior surgical repair of the right inferior orbital rim and maxillary sinus. Anterior maxillary sinus. Remote posttraumatic deformity of the nasal bones is similar to prior. Sinuses/Orbits: No retro septal gas or stranding in either orbit. Globes appear normal and symmetric. The lenses remain orthotopic. Other: None CT CERVICAL SPINE FINDINGS Alignment: Preservation of the normal cervical lordosis without traumatic listhesis. No abnormal facet widening. Normal alignment of the craniocervical and atlantoaxial articulations. Skull base and vertebrae: No acute fracture. No primary bone lesion or focal pathologic process. Soft tissues and spinal canal: No pre or paravertebral fluid or swelling. No visible canal hematoma. Disc levels: Multilevel intervertebral disc height loss with spondylitic endplate changes. Large disc osteophyte complexes at C2-3 and C3-4 result in at least moderate canal stenosis. Uncinate spurring and facet hypertrophic changes result in moderate bilateral neural foraminal narrowing at  C3-4 as well. Upper chest: No acute abnormality in the upper chest or imaged lung apices. Other: None IMPRESSION: 1. No acute intracranial abnormality. 2. Right frontal and supraorbital scalp swelling. Small left occipital scalp contusion. No subjacent calvarial or facial bone fracture identified. 3. Extensive volume loss and chronic microvascular angiopathy is similar to prior. Dilatation of the ventricles out of proportion to the sulci could reflect some underlying normal pressure hydrocephaly. 4. Remote posttraumatic deformity of the nasal bones. 5. Postsurgical changes from prior right orbital rim reconstruction. 6. No acute fracture of the cervical spine. 7. Multilevel intervertebral disc height loss with spondylitic changes. Disc osteophyte complexes at C2-3 and C3-4 results in at least moderate spinal canal stenosis. Moderate bilateral neural foraminal narrowing at C3-4 as well.  Electronically Signed   By: Kreg Shropshire M.D.   On: 05/14/2019 03:21   DG Femur Min 2 Views Right  Result Date: 05/15/2019 CLINICAL DATA:  Fall EXAM: RIGHT FEMUR 2 VIEWS COMPARISON:  Pelvis and bilateral hip 05/15/2019 FINDINGS: There is no evidence of fracture or other focal bone lesions. Soft tissues are unremarkable. IMPRESSION: Negative. Electronically Signed   By: Charlett Nose M.D.   On: 05/15/2019 03:10   DG Hips Bilat W or Wo Pelvis 3-4 Views  Result Date: 05/15/2019 CLINICAL DATA:  Fall EXAM: DG HIP (WITH OR WITHOUT PELVIS) 3-4V BILAT COMPARISON:  None. FINDINGS: There is no evidence of hip fracture or dislocation. There is no evidence of arthropathy or other focal bone abnormality. IMPRESSION: Negative. Electronically Signed   By: Charlett Nose M.D.   On: 05/15/2019 03:11    Procedures .Marland KitchenLaceration Repair  Date/Time: 05/15/2019 8:09 AM Performed by: Barkley Boards, PA-C Authorized by: Barkley Boards, PA-C   Consent:    Consent obtained:  Emergent situation   Alternatives discussed:  No  treatment Anesthesia (see MAR for exact dosages):    Anesthesia method:  None Laceration details:    Length (cm):  0.5 Repair type:    Repair type:  Simple Pre-procedure details:    Preparation:  Patient was prepped and draped in usual sterile fashion and imaging obtained to evaluate for foreign bodies Exploration:    Wound exploration: wound explored through full range of motion and entire depth of wound probed and visualized     Wound extent: no fascia violation noted, no foreign bodies/material noted, no muscle damage noted, no nerve damage noted, no tendon damage noted, no underlying fracture noted and no vascular damage noted     Contaminated: no   Treatment:    Area cleansed with:  Saline   Amount of cleaning:  Standard   Visualized foreign bodies/material removed: no   Skin repair:    Repair method:  Tissue adhesive Approximation:    Approximation:  Close Post-procedure details:    Dressing:  Open (no dressing)   Patient tolerance of procedure:  Tolerated well, no immediate complications   (including critical care time)  Medications Ordered in ED Medications - No data to display  ED Course  I have reviewed the triage vital signs and the nursing notes.  Pertinent labs & imaging results that were available during my care of the patient were reviewed by me and considered in my medical decision making (see chart for details).  Clinical Course as of May 14 812  Thu May 15, 2019  7824 Attempted to contact [facility] with no answer.   [MM]  [phone] RN reports that the patient has had 3 bowel movements since he has been in the ER and has now had a fourth.   [MM]  [phone] Spoke with Deanna Artis, med tech, at [facility] who is familiar with the patient.  She reports that he also had a bowel movement just prior to EMS arriving.  She is unsure if he has been having loose or frequent stools over the last few days.  She reports that prior to his falls that he would typically ambulates  around the facility and was very jovial and typically only oriented to self.  She reports that since recent hospitalization and recurrent falls that he has had to use a wheelchair and has not seemed like his normal self.  Reports that he did receive his nighttime dose of Ativan and trazodone earlier tonight.   [  MM]  0600 Staff now reports that the patient has had 6 bowel movements since the patient has arrived in the ER.   [MM]    Clinical Course User Index [MM] Pressley Tadesse, Coral ElseMia A, PA-C   MDM Rules/Calculators/A&P                      64 year old male with a history of Alzheimer's disease, HLD, and PVD [facility] after an unwitnessed fall.  Initially was unable to contact staff at [facility] for collateral information.  Patient has only oriented to self.  He has a small hematoma and laceration on his right forehead.  Wound repaired with Dermabond.  Work-up for the fall was otherwise unremarkable.  Imaging was negative.   Nursing staff informs me that the patient initially had 3 bowel movement since arrival in the ER.  [facility] also report that the patient had a bowel movement prior to EMS arriving.  Nursing staff later reports 3 additional bowel movement since he was in the ER. Consistency is "pudding".  He is afebrile.  Vital signs have been normal.  Patient's medical record was reviewed.  He was recently admitted for MSSA bacteremia secondary to UTI on January 14 through the 18th.  He was treated with IV Rocephin and then transitioned to oral linezolid.  Given recent hospitalization with frequent bowel movements now in the ER, will order basic screening labs, repeat urinalysis, and check for C. Difficile.  Labs are reassuring and at the patient's baseline.  UA and C. difficile are pending.  [facility] at the end of my shift to follow-up on C. difficile and urinalysis.  Patient presentation, ED course, and plan of care  discussed with review of all pertinent labs and imaging. Please see his/her note for further details regarding further ED course and disposition.   Final Clinical Impression(s) / ED Diagnoses Final diagnoses:  None    Rx / DC Orders ED Discharge Orders    None       Barkley BoardsMcDonald, Haldon Carley A, PA-C 05/15/19 0814    Nira Connardama, Pedro Eduardo, MD 05/23/19 [phone]

## 2019-05-15 NOTE — ED Provider Notes (Signed)
Unwitnessed fall this AM, hx of frequent falls and Alzheimer.  Small lac on R forehead.  Pt has had 6 BMs during this ER visit.  Recently treated for MSSA bacteremia with IV rocephin and oral linezolid within 30 days.    c diff test sent.  CMP pending.  If normal, may return back to facility. [facility]  10:25 AM Received signout the beginning of shift.  Please see previous providers note for complete H&P.  This is a 64 year old male with history of recurrent falls and history of Alzheimer currently staying at a skilled nursing facility.  He was sent here due to an unwitnessed fall this morning.  He suffered a small laceration to his right forehead that was repaired by my previous provider.  During his hospital visit and he has had multiple bowel movement.  He was previously treated for MSSA bacteremia with Rocephin and oral linezolid within the past 30 days.  A C. difficile PCR screen was obtained without any evidence of C. difficile infection.  Urine shows no signs of infection, labs are reassuring.  Mild hypokalemia with potassium 3.3, supplementation given.  Imaging including CT scan of the head, cervical spine along with x-ray of left elbow, right femur, and hips and chest Were obtained showing no acute bony injury.  At this time patient is stable to be discharged back to his facility.  BP (!) 95/59   Pulse 62   Temp 97.6 F (36.4 C) (Oral)   Resp 15   Wt 47.2 kg   SpO2 97%   BMI 16.79 kg/m   Results for orders placed or performed during the hospital encounter of 05/15/19  C difficile quick scan w PCR reflex   Specimen: STOOL  Result Value Ref Range   C Diff antigen NEGATIVE NEGATIVE   C Diff toxin NEGATIVE NEGATIVE   C Diff interpretation No C. difficile detected.   CBC with Differential  Result Value Ref Range   WBC 5.8 4.0 - 10.5 K/uL   RBC 3.45 (L) 4.22 - 5.81 MIL/uL   Hemoglobin 11.3 (L) 13.0 - 17.0 g/dL   HCT 16.1 (L) 09.6 - 04.5 %   MCV 97.1 80.0 - 100.0 fL   MCH 32.8  26.0 - 34.0 pg   MCHC 33.7 30.0 - 36.0 g/dL   RDW 40.9 (H) 81.1 - 91.4 %   Platelets 320 150 - 400 K/uL   nRBC 0.0 0.0 - 0.2 %   Neutrophils Relative % 46 %   Neutro Abs 2.7 1.7 - 7.7 K/uL   Lymphocytes Relative 39 %   Lymphs Abs 2.3 0.7 - 4.0 K/uL   Monocytes Relative 11 %   Monocytes Absolute 0.6 0.1 - 1.0 K/uL   Eosinophils Relative 2 %   Eosinophils Absolute 0.1 0.0 - 0.5 K/uL   Basophils Relative 1 %   Basophils Absolute 0.0 0.0 - 0.1 K/uL   Immature Granulocytes 1 %   Abs Immature Granulocytes 0.03 0.00 - 0.07 K/uL  Comprehensive metabolic panel  Result Value Ref Range   Sodium 141 135 - 145 mmol/L   Potassium 3.3 (L) 3.5 - 5.1 mmol/L   Chloride 105 98 - 111 mmol/L   CO2 27 22 - 32 mmol/L   Glucose, Bld 91 70 - 99 mg/dL   BUN 17 8 - 23 mg/dL   Creatinine, Ser 7.82 0.61 - 1.24 mg/dL   Calcium 8.6 (L) 8.9 - 10.3 mg/dL   Total Protein 5.9 (L) 6.5 - 8.1 g/dL  Albumin 3.0 (L) 3.5 - 5.0 g/dL   AST 23 15 - 41 U/L   ALT 27 0 - 44 U/L   Alkaline Phosphatase 48 38 - 126 U/L   Total Bilirubin 0.9 0.3 - 1.2 mg/dL   GFR calc non Af Amer >60 >60 mL/min   GFR calc Af Amer >60 >60 mL/min   Anion gap 9 5 - 15  Urinalysis, Routine w reflex microscopic  Result Value Ref Range   Color, Urine YELLOW YELLOW   APPearance CLEAR CLEAR   Specific Gravity, Urine 1.027 1.005 - 1.030   pH 5.0 5.0 - 8.0   Glucose, UA NEGATIVE NEGATIVE mg/dL   Hgb urine dipstick NEGATIVE NEGATIVE   Bilirubin Urine NEGATIVE NEGATIVE   Ketones, ur 5 (A) NEGATIVE mg/dL   Protein, ur NEGATIVE NEGATIVE mg/dL   Nitrite NEGATIVE NEGATIVE   Leukocytes,Ua NEGATIVE NEGATIVE   DG Chest 2 View  Result Date: 05/15/2019 CLINICAL DATA:  Fall EXAM: CHEST - 2 VIEW COMPARISON:  03/03/2019 FINDINGS: Heart and mediastinal contours are within normal limits. No focal opacities or effusions. No acute bony abnormality. IMPRESSION: No active cardiopulmonary disease. Electronically Signed   By: Charlett Nose M.D.   On: 05/15/2019  03:11   DG Elbow Complete Left  Result Date: 05/15/2019 CLINICAL DATA:  Fall EXAM: LEFT ELBOW - COMPLETE 3+ VIEW COMPARISON:  None. FINDINGS: There is no evidence of fracture, dislocation, or joint effusion. There is no evidence of arthropathy or other focal bone abnormality. Soft tissues are unremarkable. IMPRESSION: Negative. Electronically Signed   By: Charlett Nose M.D.   On: 05/15/2019 03:09   CT Head Wo Contrast  Result Date: 05/15/2019 CLINICAL DATA:  Headache EXAM: CT HEAD WITHOUT CONTRAST TECHNIQUE: Contiguous axial images were obtained from the base of the skull through the vertex without intravenous contrast. COMPARISON:  05/14/2019 FINDINGS: Brain: There is atrophy and chronic small vessel disease changes. No acute intracranial abnormality. Specifically, no hemorrhage, hydrocephalus, mass lesion, acute infarction, or significant intracranial injury. Vascular: No hyperdense vessel or unexpected calcification. Skull: No acute calvarial abnormality. Sinuses/Orbits: Visualized paranasal sinuses and mastoids clear. Orbital soft tissues unremarkable. Other: Soft tissue swelling and laceration in the right forehead. IMPRESSION: Atrophy, chronic microvascular disease. No acute intracranial abnormality. Electronically Signed   By: Charlett Nose M.D.   On: 05/15/2019 03:21   CT Head Wo Contrast  Result Date: 05/14/2019 CLINICAL DATA:  Fall on blood thinners, unwitnessed, right forehead hematoma EXAM: CT HEAD WITHOUT CONTRAST CT CERVICAL SPINE WITHOUT CONTRAST TECHNIQUE: Multidetector CT imaging of the head and cervical spine was performed following the standard protocol without intravenous contrast. Multiplanar CT image reconstructions of the cervical spine were also generated. COMPARISON:  CT head and cervical spine 05/09/2019 FINDINGS: CT HEAD FINDINGS Brain: No evidence of acute infarction, hemorrhage, hydrocephalus, extra-axial collection or mass lesion/mass effect. Symmetric prominence of the  ventricles, cisterns and sulci compatible with parenchymal volume loss. Dilatation of the ventricles is somewhat out of proportion to the sulci. Extensive patchy and confluent areas of white matter hypoattenuation are most compatible with chronic microvascular angiopathy. Vascular: Atherosclerotic calcification of the carotid siphons. No hyperdense vessel. Skull: There is right frontal and supraorbital scalp swelling and thickening. Small left occipital scalp contusion is noted as well. No subjacent calvarial or visible acute facial bone fracture is seen. Postsurgical changes from prior surgical repair of the right inferior orbital rim and maxillary sinus. Anterior maxillary sinus. Remote posttraumatic deformity of the nasal bones is similar to prior. Sinuses/Orbits:  No retro septal gas or stranding in either orbit. Globes appear normal and symmetric. The lenses remain orthotopic. Other: None CT CERVICAL SPINE FINDINGS Alignment: Preservation of the normal cervical lordosis without traumatic listhesis. No abnormal facet widening. Normal alignment of the craniocervical and atlantoaxial articulations. Skull base and vertebrae: No acute fracture. No primary bone lesion or focal pathologic process. Soft tissues and spinal canal: No pre or paravertebral fluid or swelling. No visible canal hematoma. Disc levels: Multilevel intervertebral disc height loss with spondylitic endplate changes. Large disc osteophyte complexes at C2-3 and C3-4 result in at least moderate canal stenosis. Uncinate spurring and facet hypertrophic changes result in moderate bilateral neural foraminal narrowing at C3-4 as well. Upper chest: No acute abnormality in the upper chest or imaged lung apices. Other: None IMPRESSION: 1. No acute intracranial abnormality. 2. Right frontal and supraorbital scalp swelling. Small left occipital scalp contusion. No subjacent calvarial or facial bone fracture identified. 3. Extensive volume loss and chronic  microvascular angiopathy is similar to prior. Dilatation of the ventricles out of proportion to the sulci could reflect some underlying normal pressure hydrocephaly. 4. Remote posttraumatic deformity of the nasal bones. 5. Postsurgical changes from prior right orbital rim reconstruction. 6. No acute fracture of the cervical spine. 7. Multilevel intervertebral disc height loss with spondylitic changes. Disc osteophyte complexes at C2-3 and C3-4 results in at least moderate spinal canal stenosis. Moderate bilateral neural foraminal narrowing at C3-4 as well. Electronically Signed   By: Kreg Shropshire M.D.   On: 05/14/2019 03:21   CT Head Wo Contrast  Result Date: 05/09/2019 CLINICAL DATA:  Fall, headache, hematoma EXAM: CT HEAD WITHOUT CONTRAST CT CERVICAL SPINE WITHOUT CONTRAST TECHNIQUE: Multidetector CT imaging of the head and cervical spine was performed following the standard protocol without intravenous contrast. Multiplanar CT image reconstructions of the cervical spine were also generated. COMPARISON:  05/01/2019 FINDINGS: CT HEAD FINDINGS Brain: No evidence of acute infarction, hemorrhage, hydrocephalus, extra-axial collection or mass lesion/mass effect. Periventricular white matter hypodensity and prominence of the lateral ventricles unchanged compared to prior examination. Vascular: No hyperdense vessel or unexpected calcification. Skull: Normal. Negative for acute fracture or focal lesion. Chronic fracture deformity of the right maxilla. Sinuses/Orbits: No acute finding. Other: None. CT CERVICAL SPINE FINDINGS Alignment: Normal. Skull base and vertebrae: No acute fracture. No primary bone lesion or focal pathologic process. Soft tissues and spinal canal: No prevertebral fluid or swelling. No visible canal hematoma. Disc levels: Mild multilevel disc space height loss and osteophytosis. Upper chest: Negative. Other: None. IMPRESSION: 1. No acute intracranial pathology. 2. Redemonstrated prominence of the  lateral ventricles, likely ex vacuo secondary to global cerebral volume loss, although normal pressure hydrocephalus could have this appearance, particularly given clinical history of frequent falling. 3. No fracture or static subluxation of the cervical spine. Electronically Signed   By: Lauralyn Primes M.D.   On: 05/09/2019 13:09   CT HEAD WO CONTRAST  Result Date: 05/01/2019 CLINICAL DATA:  63 year old male with history of trauma from a fall. Head and neck pain. EXAM: CT HEAD WITHOUT CONTRAST CT CERVICAL SPINE WITHOUT CONTRAST TECHNIQUE: Multidetector CT imaging of the head and cervical spine was performed following the standard protocol without intravenous contrast. Multiplanar CT image reconstructions of the cervical spine were also generated. COMPARISON:  Head and neck CT 04/24/2019. FINDINGS: CT HEAD FINDINGS Brain: Moderate cerebral atrophy with ex vacuo dilatation of the ventricular system, similar to prior examinations. Patchy and confluent areas of decreased attenuation are noted throughout the  deep and periventricular white matter of the cerebral hemispheres bilaterally, compatible with chronic microvascular ischemic disease. No evidence of acute infarction, hemorrhage, hydrocephalus, extra-axial collection or mass lesion/mass effect. Vascular: No hyperdense vessel or unexpected calcification. Skull: Normal. Negative for fracture or focal lesion. Fixation cerclage wires noted in the anterior aspect of the right maxillary bone. Sinuses/Orbits: No acute finding. Other: Previously noted small right frontal scalp hematoma has decreased in size, currently measuring up to 4 mm in thickness. CT CERVICAL SPINE FINDINGS Alignment: Normal. Skull base and vertebrae: No acute fracture. No primary bone lesion or focal pathologic process. Soft tissues and spinal canal: No prevertebral fluid or swelling. No visible canal hematoma. Disc levels: Mild multilevel degenerative disc disease, most pronounced at C2-C3, C3-C4  and C4-C5. Mild multilevel facet arthropathy. Upper chest: Unremarkable. Other: None. IMPRESSION: 1. No evidence of significant acute traumatic injury to the skull, brain or cervical spine. 2. Resolving right frontal scalp hematoma which has decreased in size compared to prior study from 04/24/2019. 3. Moderate cerebral atrophy with ex vacuo dilatation of the ventricular system. Although there is no definitive imaging evidence of normal pressure hydrocephalus, given the patient's history of repeated falls and recurrent trauma, this should be considered clinically and further evaluated. 4. Mild multilevel degenerative disc disease and cervical spondylosis, as above. Electronically Signed   By: Trudie Reed M.D.   On: 05/01/2019 09:04   CT Head Wo Contrast  Result Date: 04/24/2019 CLINICAL DATA:  Status post trauma. EXAM: CT HEAD WITHOUT CONTRAST TECHNIQUE: Contiguous axial images were obtained from the base of the skull through the vertex without intravenous contrast. COMPARISON:  March 03, 2019 FINDINGS: Brain: There is moderate severity cerebral atrophy with widening of the extra-axial spaces and ventricular dilatation. There are areas of decreased attenuation within the white matter tracts of the supratentorial brain, consistent with microvascular disease changes. Vascular: No hyperdense vessel or unexpected calcification. Skull: Normal. Negative for fracture or focal lesion. Sinuses/Orbits: A small metallic density fixation plate is seen along the anterior wall of the right maxillary sinus. Other: There is mild right frontal scalp soft tissue swelling. An associated 2.4 cm x 0.8 cm right frontal scalp hematoma is noted. IMPRESSION: 1. Right frontal scalp soft tissue swelling and small right frontal scalp hematoma. 2. No acute intracranial abnormality. 3. Moderate severity cerebral atrophy and microvascular disease changes of the supratentorial brain. Electronically Signed   By: Aram Candela M.D.    On: 04/24/2019 22:26   CT Cervical Spine Wo Contrast  Result Date: 05/15/2019 CLINICAL DATA:  Fall EXAM: CT CERVICAL SPINE WITHOUT CONTRAST TECHNIQUE: Multidetector CT imaging of the cervical spine was performed without intravenous contrast. Multiplanar CT image reconstructions were also generated. COMPARISON:  05/13/2022 FINDINGS: Alignment: Normal. Skull base and vertebrae: No acute fracture. No primary bone lesion or focal pathologic process. Soft tissues and spinal canal: No prevertebral fluid or swelling. No visible canal hematoma. Disc levels:  Diffuse degenerative disc disease and facet disease Upper chest: No acute findings Other: None IMPRESSION: No acute bony abnormality in the cervical spine. Electronically Signed   By: Charlett Nose M.D.   On: 05/15/2019 03:23   CT Cervical Spine Wo Contrast  Result Date: 05/14/2019 CLINICAL DATA:  Fall on blood thinners, unwitnessed, right forehead hematoma EXAM: CT HEAD WITHOUT CONTRAST CT CERVICAL SPINE WITHOUT CONTRAST TECHNIQUE: Multidetector CT imaging of the head and cervical spine was performed following the standard protocol without intravenous contrast. Multiplanar CT image reconstructions of the cervical spine were also  generated. COMPARISON:  CT head and cervical spine 05/09/2019 FINDINGS: CT HEAD FINDINGS Brain: No evidence of acute infarction, hemorrhage, hydrocephalus, extra-axial collection or mass lesion/mass effect. Symmetric prominence of the ventricles, cisterns and sulci compatible with parenchymal volume loss. Dilatation of the ventricles is somewhat out of proportion to the sulci. Extensive patchy and confluent areas of white matter hypoattenuation are most compatible with chronic microvascular angiopathy. Vascular: Atherosclerotic calcification of the carotid siphons. No hyperdense vessel. Skull: There is right frontal and supraorbital scalp swelling and thickening. Small left occipital scalp contusion is noted as well. No subjacent  calvarial or visible acute facial bone fracture is seen. Postsurgical changes from prior surgical repair of the right inferior orbital rim and maxillary sinus. Anterior maxillary sinus. Remote posttraumatic deformity of the nasal bones is similar to prior. Sinuses/Orbits: No retro septal gas or stranding in either orbit. Globes appear normal and symmetric. The lenses remain orthotopic. Other: None CT CERVICAL SPINE FINDINGS Alignment: Preservation of the normal cervical lordosis without traumatic listhesis. No abnormal facet widening. Normal alignment of the craniocervical and atlantoaxial articulations. Skull base and vertebrae: No acute fracture. No primary bone lesion or focal pathologic process. Soft tissues and spinal canal: No pre or paravertebral fluid or swelling. No visible canal hematoma. Disc levels: Multilevel intervertebral disc height loss with spondylitic endplate changes. Large disc osteophyte complexes at C2-3 and C3-4 result in at least moderate canal stenosis. Uncinate spurring and facet hypertrophic changes result in moderate bilateral neural foraminal narrowing at C3-4 as well. Upper chest: No acute abnormality in the upper chest or imaged lung apices. Other: None IMPRESSION: 1. No acute intracranial abnormality. 2. Right frontal and supraorbital scalp swelling. Small left occipital scalp contusion. No subjacent calvarial or facial bone fracture identified. 3. Extensive volume loss and chronic microvascular angiopathy is similar to prior. Dilatation of the ventricles out of proportion to the sulci could reflect some underlying normal pressure hydrocephaly. 4. Remote posttraumatic deformity of the nasal bones. 5. Postsurgical changes from prior right orbital rim reconstruction. 6. No acute fracture of the cervical spine. 7. Multilevel intervertebral disc height loss with spondylitic changes. Disc osteophyte complexes at C2-3 and C3-4 results in at least moderate spinal canal stenosis. Moderate  bilateral neural foraminal narrowing at C3-4 as well. Electronically Signed   By: Kreg ShropshirePrice  DeHay M.D.   On: 05/14/2019 03:21   CT Cervical Spine Wo Contrast  Result Date: 05/09/2019 CLINICAL DATA:  Fall, headache, hematoma EXAM: CT HEAD WITHOUT CONTRAST CT CERVICAL SPINE WITHOUT CONTRAST TECHNIQUE: Multidetector CT imaging of the head and cervical spine was performed following the standard protocol without intravenous contrast. Multiplanar CT image reconstructions of the cervical spine were also generated. COMPARISON:  05/01/2019 FINDINGS: CT HEAD FINDINGS Brain: No evidence of acute infarction, hemorrhage, hydrocephalus, extra-axial collection or mass lesion/mass effect. Periventricular white matter hypodensity and prominence of the lateral ventricles unchanged compared to prior examination. Vascular: No hyperdense vessel or unexpected calcification. Skull: Normal. Negative for acute fracture or focal lesion. Chronic fracture deformity of the right maxilla. Sinuses/Orbits: No acute finding. Other: None. CT CERVICAL SPINE FINDINGS Alignment: Normal. Skull base and vertebrae: No acute fracture. No primary bone lesion or focal pathologic process. Soft tissues and spinal canal: No prevertebral fluid or swelling. No visible canal hematoma. Disc levels: Mild multilevel disc space height loss and osteophytosis. Upper chest: Negative. Other: None. IMPRESSION: 1. No acute intracranial pathology. 2. Redemonstrated prominence of the lateral ventricles, likely ex vacuo secondary to global cerebral volume loss, although normal pressure  hydrocephalus could have this appearance, particularly given clinical history of frequent falling. 3. No fracture or static subluxation of the cervical spine. Electronically Signed   By: Lauralyn Primes M.D.   On: 05/09/2019 13:09   CT Cervical Spine Wo Contrast  Result Date: 05/01/2019 CLINICAL DATA:  64 year old male with history of trauma from a fall. Head and neck pain. EXAM: CT HEAD  WITHOUT CONTRAST CT CERVICAL SPINE WITHOUT CONTRAST TECHNIQUE: Multidetector CT imaging of the head and cervical spine was performed following the standard protocol without intravenous contrast. Multiplanar CT image reconstructions of the cervical spine were also generated. COMPARISON:  Head and neck CT 04/24/2019. FINDINGS: CT HEAD FINDINGS Brain: Moderate cerebral atrophy with ex vacuo dilatation of the ventricular system, similar to prior examinations. Patchy and confluent areas of decreased attenuation are noted throughout the deep and periventricular white matter of the cerebral hemispheres bilaterally, compatible with chronic microvascular ischemic disease. No evidence of acute infarction, hemorrhage, hydrocephalus, extra-axial collection or mass lesion/mass effect. Vascular: No hyperdense vessel or unexpected calcification. Skull: Normal. Negative for fracture or focal lesion. Fixation cerclage wires noted in the anterior aspect of the right maxillary bone. Sinuses/Orbits: No acute finding. Other: Previously noted small right frontal scalp hematoma has decreased in size, currently measuring up to 4 mm in thickness. CT CERVICAL SPINE FINDINGS Alignment: Normal. Skull base and vertebrae: No acute fracture. No primary bone lesion or focal pathologic process. Soft tissues and spinal canal: No prevertebral fluid or swelling. No visible canal hematoma. Disc levels: Mild multilevel degenerative disc disease, most pronounced at C2-C3, C3-C4 and C4-C5. Mild multilevel facet arthropathy. Upper chest: Unremarkable. Other: None. IMPRESSION: 1. No evidence of significant acute traumatic injury to the skull, brain or cervical spine. 2. Resolving right frontal scalp hematoma which has decreased in size compared to prior study from 04/24/2019. 3. Moderate cerebral atrophy with ex vacuo dilatation of the ventricular system. Although there is no definitive imaging evidence of normal pressure hydrocephalus, given the patient's  history of repeated falls and recurrent trauma, this should be considered clinically and further evaluated. 4. Mild multilevel degenerative disc disease and cervical spondylosis, as above. Electronically Signed   By: Trudie Reed M.D.   On: 05/01/2019 09:04   CT Cervical Spine Wo Contrast  Result Date: 04/24/2019 CLINICAL DATA:  Status post trauma. EXAM: CT CERVICAL SPINE WITHOUT CONTRAST TECHNIQUE: Multidetector CT imaging of the cervical spine was performed without intravenous contrast. Multiplanar CT image reconstructions were also generated. COMPARISON:  March 03, 2019 FINDINGS: Alignment: Normal. Skull base and vertebrae: No acute fracture. No primary bone lesion or focal pathologic process. Soft tissues and spinal canal: No prevertebral fluid or swelling. No visible canal hematoma. Disc levels: C2-3: There is mild end plate spondylosis. Mild disc space narrowing is seen. Bilateral facet hypertrophy is noted. Normal central canal and intervertebral neuroforamina. C3-4: There is mild end plate spondylosis. Mild disc space narrowing is seen. Bilateral facet hypertrophy is noted. Normal central canal and intervertebral neuroforamina. C4-5: There is mild to moderate severity end plate spondylosis. Mild disc space narrowing is seen. Bilateral facet hypertrophy is noted. Normal central canal and intervertebral neuroforamina. C5-6: There is mild end plate spondylosis. Mild disc space narrowing is seen. Bilateral facet hypertrophy is noted. Normal central canal and intervertebral neuroforamina. C6-7: There is mild end plate spondylosis. Mild disc space narrowing is seen. Bilateral facet hypertrophy is noted. Normal central canal and intervertebral neuroforamina. C7-T1: There is mild end plate spondylosis. Mild disc space narrowing is seen. Bilateral  facet hypertrophy is noted. Normal central canal and intervertebral neuroforamina. Upper chest: Negative. Other: A stable 1.0 cm x 0.4 cm sclerotic focus is seen  within the first left rib. IMPRESSION: 1. No acute osseous abnormality. 2. Mild degenerative changes, slightly more prominent at the levels of C4-C5 and C5-C6. Electronically Signed   By: Virgina Norfolk M.D.   On: 04/24/2019 22:40   ECHOCARDIOGRAM COMPLETE  Result Date: 05/02/2019   ECHOCARDIOGRAM REPORT   Patient Name:   MIKO SIRICO Date of Exam: 05/02/2019 Medical Rec #:  [id number]       Height:       66.0 in Accession #:    [id number]      Weight:       127.9 lb Date of Birth:  1955/10/28       BSA:          1.65 m Patient Age:    83 years        BP:           97/70 mmHg Patient Gender: M               HR:           75 bpm. Exam Location:  Inpatient Procedure: 2D Echo, Color Doppler and Cardiac Doppler Indications:    Bacteremia  History:        Patient has no prior history of Echocardiogram examinations.                 Signs/Symptoms:Bacteremia, Alzheimer's and Altered Mental                 Status; Risk Factors:Dyslipidemia.  Sonographer:    Raquel Sarna Senior RDCS Referring Phys: [id number] ERIK C HOFFMAN  Sonographer Comments: Technically difficult due to altered mental status and body habitus. IMPRESSIONS  1. Left ventricular ejection fraction, by visual estimation, is 60 to 65%. The left ventricle has normal function. There is no left ventricular hypertrophy.  2. Global right ventricle has normal systolic function.The right ventricular size is normal. No increase in right ventricular wall thickness.  3. Left atrial size was normal.  4. Right atrial size was not well visualized.  5. The mitral valve is normal in structure. No evidence of mitral valve regurgitation.  6. The tricuspid valve is grossly normal. Tricuspid valve regurgitation is trivial  7. The aortic valve was not well visualized. Aortic valve regurgitation is not visualized. No evidence of aortic valve stenosis.  8. The pulmonic valve was not well visualized. Pulmonic valve regurgitation is not visualized.  9. The inferior vena cava is normal in  size with <50% respiratory variability, suggesting right atrial pressure of 8 mmHg. 10. Technically difficult study. No clear vegetation seen. If clinical suspicion for endocarditis, recommend TEE FINDINGS  Left Ventricle: Left ventricular ejection fraction, by visual estimation, is 60 to 65%. The left ventricle has normal function. The left ventricle has no regional wall motion abnormalities. There is no left ventricular hypertrophy. Left ventricular diastolic parameters were normal. Right Ventricle: The right ventricular size is normal. No increase in right ventricular wall thickness. Global RV systolic function is has normal systolic function. The tricuspid regurgitant velocity is 1.89 m/s, and with an assumed right atrial pressure  of 8 mmHg, the estimated right ventricular systolic pressure is normal at 22.3 mmHg. Left Atrium: Left atrial size was normal in size. Right Atrium: Right atrial size was not well visualized Pericardium: Trivial pericardial effusion is present. Mitral Valve: The mitral valve is  normal in structure. No evidence of mitral valve regurgitation. Tricuspid Valve: The tricuspid valve is not well visualized. Tricuspid valve regurgitation is trivial. Aortic Valve: The aortic valve was not well visualized. Aortic valve regurgitation is not visualized. The aortic valve is structurally normal, with no evidence of sclerosis or stenosis. Pulmonic Valve: The pulmonic valve was not well visualized. Pulmonic valve regurgitation is not visualized. Pulmonic regurgitation is not visualized. Aorta: The aortic root is normal in size and structure. Venous: The inferior vena cava is normal in size with less than 50% respiratory variability, suggesting right atrial pressure of 8 mmHg. IAS/Shunts: The interatrial septum was not well visualized.  LEFT VENTRICLE PLAX 2D LVIDd:         3.60 cm  Diastology LVIDs:         2.10 cm  LV e' lateral:   11.40 cm/s LV PW:         0.80 cm  LV E/e' lateral: 5.6 LV IVS:         0.60 cm  LV e' medial:    8.16 cm/s LVOT diam:     1.90 cm  LV E/e' medial:  7.8 LV SV:         40 ml LV SV Index:   24.39 LVOT Area:     2.84 cm  RIGHT VENTRICLE RV S prime:     9.25 cm/s TAPSE (M-mode): 1.9 cm LEFT ATRIUM             Index       RIGHT ATRIUM           Index LA diam:        2.80 cm 1.69 cm/m  RA Area:     12.50 cm LA Vol (A2C):   36.0 ml 21.77 ml/m RA Volume:   30.10 ml  18.20 ml/m LA Vol (A4C):   55.7 ml 33.68 ml/m LA Biplane Vol: 38.1 ml 23.04 ml/m  AORTIC VALVE LVOT Vmax:   87.50 cm/s LVOT Vmean:  61.000 cm/s LVOT VTI:    0.192 m  AORTA Ao Root diam: 2.60 cm MITRAL VALVE                        TRICUSPID VALVE MV Area (PHT): 3.91 cm             TR Peak grad:   14.3 mmHg MV PHT:        56.26 msec           TR Vmax:        189.00 cm/s MV Decel Time: 194 msec MV E velocity: 63.40 cm/s 103 cm/s  SHUNTS MV A velocity: 60.40 cm/s 70.3 cm/s Systemic VTI:  0.19 m MV E/A ratio:  1.05       1.5       Systemic Diam: 1.90 cm  Epifanio Lesches MD Electronically signed by Epifanio Lesches MD Signature Date/Time: 05/02/2019/4:45:45 PM    Final    DG Femur Min 2 Views Right  Result Date: 05/15/2019 CLINICAL DATA:  Fall EXAM: RIGHT FEMUR 2 VIEWS COMPARISON:  Pelvis and bilateral hip 05/15/2019 FINDINGS: There is no evidence of fracture or other focal bone lesions. Soft tissues are unremarkable. IMPRESSION: Negative. Electronically Signed   By: Charlett Nose M.D.   On: 05/15/2019 03:10   DG Hips Bilat W or Wo Pelvis 3-4 Views  Result Date: 05/15/2019 CLINICAL DATA:  Fall EXAM: DG HIP (WITH OR WITHOUT PELVIS) 3-4V BILAT COMPARISON:  None. FINDINGS: There is no evidence of hip fracture or dislocation. There is no evidence of arthropathy or other focal bone abnormality. IMPRESSION: Negative. Electronically Signed   By: Charlett NoseKevin  Dover M.D.   On: 05/15/2019 03:11      Fayrene Helperran, Sharmila Wrobleski, PA-C 05/15/19 1031    Cardama, Amadeo GarnetPedro Eduardo, MD 05/23/19 [id number]

## 2019-05-15 NOTE — ED Notes (Signed)
Pt cleaned and peri care provided after pt's fourth BM.

## 2019-05-15 NOTE — Discharge Instructions (Addendum)
Repeat work-up with CT head neck and also did x-rays of both hips and pelvis no brain injury or acute bony injury.  Previous wound has an abrasion on it the stitches are still intact just local wound care.  Suture removal from what was done this morning and anywhere from 5 to 7 days.  Patient stable for return to nursing facility.

## 2019-05-15 NOTE — ED Notes (Signed)
Pt provided with peri care due to having another BM. Pt repositioned in the bed and provided mepalex dressing to bottom and back where skin is breaking down.

## 2019-05-15 NOTE — ED Notes (Signed)
Pt had incontinent episode of urine and stool. Pt provided with peri care and bath due to pt getting hands in stool and getting it all over his body.  Condom cath placed to catch urine specimen.

## 2019-05-15 NOTE — ED Notes (Signed)
Pt cleaned and provided with peri care after arriving to ED covered in stool. Fall alarm pad placed underneath pt due to pt being high fall risk. Wound care completed to pt's forehead and dressing applied.

## 2019-05-15 NOTE — ED Notes (Signed)
[facility] has been contacted regarding patient transport.  

## 2019-05-15 NOTE — ED Notes (Signed)
Mia, PA ok with not doing in and out cath at this time due to pt voiding recently.

## 2019-05-15 NOTE — Discharge Instructions (Signed)
You have been evaluated for your fall.  You suffered a small laceration to your forehead which was repaired.  Continue taking Tylenol as needed for pain.  You have had multiple episodes of diarrhea here however your C. difficile test is negative.  Your potassium level was low, please have it rechecked by your doctor in the next 3 to 5 days.

## 2019-05-15 NOTE — ED Triage Notes (Signed)
Pt was seen on 1/26 for a fall. EMS called to facility for an unwitnessed fall with head injury noted to the right anterior forehead. EMS reported pt to be oriented to self and staff unable to report that this was consistent or not to pt's baseline. Pt with history of dementia. PERRLA reported. Pt opens eyes to voice however is drowsy.

## 2019-05-15 NOTE — ED Provider Notes (Signed)
[facility] DEPT Provider Note   CSN: [id number] Arrival date & time: 05/15/19  1826     History No chief complaint on file.   Kenneth Mcdaniel is a 64 y.o. male.  Patient brought in from nursing facility.  Patient was just discharged back at about 11 in the morning.  Patient had an unwitnessed fall.  Apparently when he got back there he actually fell and hit his head on the wall not to go completely down from the best we can tell.  He had a forehead wound before that was sutured.  He had CT head neck and had x-rays of his pelvis as well as some of his extremity without any significant injuries.  Patient had some delay going back as apparently had some diarrhea and they would take him back unless the C. difficile was negative that was done and it was negative.  Shortly after patient got back he apparently had a fall where he hit his head on the wall.        Past Medical History:  Diagnosis Date  . Agitation   . Early onset Alzheimer's dementia ([place])    from [facility]  . Hyperlipidemia   . PVD (peripheral vascular disease) [facility])     Patient Active Problem List   Diagnosis Date Noted  . Sepsis secondary to UTI ([place]) 05/01/2019  . Acute encephalopathy 05/01/2019  . MSSA bacteremia 05/01/2019  . UTI (urinary tract infection) 11/20/2018  . Seizure ([place]) 11/20/2018  . Sepsis ([place]) 11/20/2018  . Dementia ([place]) 03/21/2017    History reviewed. No pertinent surgical history.     Family History  Problem Relation Age of Onset  . Stroke Mother   . Diabetes Neg Hx   . Hypertension Neg Hx     Social History   Tobacco Use  . Smoking status: Never Smoker  . Smokeless tobacco: Never Used  Substance Use Topics  . Alcohol use: Never  . Drug use: No    Home Medications Prior to Admission medications   Medication Sig Start Date End Date Taking? Authorizing Provider  acetaminophen (TYLENOL) 500 MG tablet Take 1 tablet (500 mg total) by mouth  every 4 (four) hours. Please only give while awake. 05/05/19   [name], MD  ALPRAZolam [name]) 0.5 MG tablet Take 1 tablet (0.5 mg total) by mouth 2 (two) times daily. 11/23/18   [name], MD  ALPRAZolam [name]) 1 MG tablet Take 1 tablet (1 mg total) by mouth at bedtime. 11/23/18   [name], MD  Cholecalciferol (VITAMIN D-3) 125 MCG (5000 UT) TABS Take 5,000 Units by mouth daily. 05/05/19   [name], MD  divalproex (DEPAKOTE SPRINKLE) 125 MG capsule Take 4 capsules (500 mg total) by mouth 2 (two) times daily. 12/21/18   [name], MD  feeding supplement, GLUCERNA SHAKE, (GLUCERNA SHAKE) LIQD Take 237 mLs by mouth See admin instructions. Drink 1 shake (237 ml's) by mouth three times a day and with any snacks (CHOCOLATE)     [provider]  linezolid (ZYVOX) 600 MG tablet Take 600 mg by mouth 2 (two) times daily.    [provider]  mirtazapine (REMERON) 15 MG tablet Take 15 mg by mouth at bedtime.    [provider]  traZODone (DESYREL) 100 MG tablet Take 100 mg by mouth at bedtime.  08/16/18   [provider]  zinc gluconate 50 MG tablet Take 2 tablets (100 mg total) by mouth daily. 05/05/19  [name], MD    Allergies    Patient has no known allergies.  Review of Systems   Review of Systems  Unable to perform ROS: Dementia    Physical Exam Updated Vital Signs BP 97/70   Pulse 65   Temp 98.6 F (37 C)   Resp 16   SpO2 100%   Physical Exam Vitals and nursing note reviewed.  Constitutional:      Appearance: Normal appearance. He is well-developed.  HENT:     Head: Normocephalic.     Comments: Patient with a significant hematoma right forehead area.  Also has about a 1 cm sutured laceration in that area did top part of the has about a 5 mm area of abrasion.  Not sure whether this is new or old.  No active bleeding. Eyes:     Extraocular Movements: Extraocular movements intact.     Conjunctiva/sclera: Conjunctivae  normal.     Pupils: Pupils are equal, round, and reactive to light.  Cardiovascular:     Rate and Rhythm: Normal rate and regular rhythm.     Heart sounds: No murmur.  Pulmonary:     Effort: Pulmonary effort is normal. No respiratory distress.     Breath sounds: Normal breath sounds.  Abdominal:     Palpations: Abdomen is soft.     Tenderness: There is no abdominal tenderness.  Musculoskeletal:        General: No swelling or tenderness. Normal range of motion.     Cervical back: Normal range of motion and neck supple.  Skin:    General: Skin is warm and dry.  Neurological:     Mental Status: He is alert. Mental status is at baseline.     Comments: Patient moving extremities difficult to determine whether is any significant pain there appears to be none.     ED Results / Procedures / Treatments   Labs (all labs ordered are listed, but only abnormal results are displayed) Labs Reviewed - No data to display  EKG None  Radiology DG Chest 2 View  Result Date: 05/15/2019 CLINICAL DATA:  Fall EXAM: CHEST - 2 VIEW COMPARISON:  03/03/2019 FINDINGS: Heart and mediastinal contours are within normal limits. No focal opacities or effusions. No acute bony abnormality. IMPRESSION: No active cardiopulmonary disease. Electronically Signed   By: Charlett Nose M.D.   On: 05/15/2019 03:11   DG Elbow Complete Left  Result Date: 05/15/2019 CLINICAL DATA:  Fall EXAM: LEFT ELBOW - COMPLETE 3+ VIEW COMPARISON:  None. FINDINGS: There is no evidence of fracture, dislocation, or joint effusion. There is no evidence of arthropathy or other focal bone abnormality. Soft tissues are unremarkable. IMPRESSION: Negative. Electronically Signed   By: Charlett Nose M.D.   On: 05/15/2019 03:09   CT Head Wo Contrast  Result Date: 05/15/2019 CLINICAL DATA:  Status post fall. EXAM: CT HEAD WITHOUT CONTRAST TECHNIQUE: Contiguous axial images were obtained from the base of the skull through the vertex without intravenous  contrast. COMPARISON:  May 15, 2019 FINDINGS: Brain: There is moderate severity cerebral atrophy with widening of the extra-axial spaces and ventricular dilatation. There are areas of decreased attenuation within the white matter tracts of the supratentorial brain, consistent with microvascular disease changes. Vascular: No hyperdense vessel or unexpected calcification. Skull: Normal. Negative for fracture or focal lesion. Sinuses/Orbits: No acute finding. A small metallic density fixation plate and screws are seen along the anterior wall of the right maxillary sinus. Other: Moderate severity right frontal scalp  soft tissue swelling is seen. An ill-defined scalp hematoma is suspected. IMPRESSION: 1. Moderate severity right frontal scalp soft tissue swelling and associated scalp hematoma, without evidence of acute fracture or acute intracranial abnormality. 2. Generalized cerebral atrophy. Electronically Signed   By: Aram Candelahaddeus  Houston M.D.   On: 05/15/2019 20:02   CT Head Wo Contrast  Result Date: 05/15/2019 CLINICAL DATA:  Headache EXAM: CT HEAD WITHOUT CONTRAST TECHNIQUE: Contiguous axial images were obtained from the base of the skull through the vertex without intravenous contrast. COMPARISON:  05/14/2019 FINDINGS: Brain: There is atrophy and chronic small vessel disease changes. No acute intracranial abnormality. Specifically, no hemorrhage, hydrocephalus, mass lesion, acute infarction, or significant intracranial injury. Vascular: No hyperdense vessel or unexpected calcification. Skull: No acute calvarial abnormality. Sinuses/Orbits: Visualized paranasal sinuses and mastoids clear. Orbital soft tissues unremarkable. Other: Soft tissue swelling and laceration in the right forehead. IMPRESSION: Atrophy, chronic microvascular disease. No acute intracranial abnormality. Electronically Signed   By: Charlett NoseKevin  Dover M.D.   On: 05/15/2019 03:21   CT Head Wo Contrast  Result Date: 05/14/2019 CLINICAL DATA:   Fall on blood thinners, unwitnessed, right forehead hematoma EXAM: CT HEAD WITHOUT CONTRAST CT CERVICAL SPINE WITHOUT CONTRAST TECHNIQUE: Multidetector CT imaging of the head and cervical spine was performed following the standard protocol without intravenous contrast. Multiplanar CT image reconstructions of the cervical spine were also generated. COMPARISON:  CT head and cervical spine 05/09/2019 FINDINGS: CT HEAD FINDINGS Brain: No evidence of acute infarction, hemorrhage, hydrocephalus, extra-axial collection or mass lesion/mass effect. Symmetric prominence of the ventricles, cisterns and sulci compatible with parenchymal volume loss. Dilatation of the ventricles is somewhat out of proportion to the sulci. Extensive patchy and confluent areas of white matter hypoattenuation are most compatible with chronic microvascular angiopathy. Vascular: Atherosclerotic calcification of the carotid siphons. No hyperdense vessel. Skull: There is right frontal and supraorbital scalp swelling and thickening. Small left occipital scalp contusion is noted as well. No subjacent calvarial or visible acute facial bone fracture is seen. Postsurgical changes from prior surgical repair of the right inferior orbital rim and maxillary sinus. Anterior maxillary sinus. Remote posttraumatic deformity of the nasal bones is similar to prior. Sinuses/Orbits: No retro septal gas or stranding in either orbit. Globes appear normal and symmetric. The lenses remain orthotopic. Other: None CT CERVICAL SPINE FINDINGS Alignment: Preservation of the normal cervical lordosis without traumatic listhesis. No abnormal facet widening. Normal alignment of the craniocervical and atlantoaxial articulations. Skull base and vertebrae: No acute fracture. No primary bone lesion or focal pathologic process. Soft tissues and spinal canal: No pre or paravertebral fluid or swelling. No visible canal hematoma. Disc levels: Multilevel intervertebral disc height loss with  spondylitic endplate changes. Large disc osteophyte complexes at C2-3 and C3-4 result in at least moderate canal stenosis. Uncinate spurring and facet hypertrophic changes result in moderate bilateral neural foraminal narrowing at C3-4 as well. Upper chest: No acute abnormality in the upper chest or imaged lung apices. Other: None IMPRESSION: 1. No acute intracranial abnormality. 2. Right frontal and supraorbital scalp swelling. Small left occipital scalp contusion. No subjacent calvarial or facial bone fracture identified. 3. Extensive volume loss and chronic microvascular angiopathy is similar to prior. Dilatation of the ventricles out of proportion to the sulci could reflect some underlying normal pressure hydrocephaly. 4. Remote posttraumatic deformity of the nasal bones. 5. Postsurgical changes from prior right orbital rim reconstruction. 6. No acute fracture of the cervical spine. 7. Multilevel intervertebral disc height loss with spondylitic changes.  Disc osteophyte complexes at C2-3 and C3-4 results in at least moderate spinal canal stenosis. Moderate bilateral neural foraminal narrowing at C3-4 as well. Electronically Signed   By: Kreg Shropshire M.D.   On: 05/14/2019 03:21   CT Cervical Spine Wo Contrast  Result Date: 05/15/2019 CLINICAL DATA:  Status post fall. EXAM: CT CERVICAL SPINE WITHOUT CONTRAST TECHNIQUE: Multidetector CT imaging of the cervical spine was performed without intravenous contrast. Multiplanar CT image reconstructions were also generated. COMPARISON:  May 15, 2019 FINDINGS: Alignment: Normal. Skull base and vertebrae: No acute fracture. No primary bone lesion or focal pathologic process. Soft tissues and spinal canal: No prevertebral fluid or swelling. No visible canal hematoma. Disc levels: C2-3: There is mild end plate spondylosis. Mild disc space narrowing is seen. Bilateral facet hypertrophy is noted. Normal central canal and intervertebral neuroforamina. C3-4: There is mild  end plate spondylosis. Mild disc space narrowing is seen. Bilateral facet hypertrophy is noted. Normal central canal and intervertebral neuroforamina. C4-5: There is mild to moderate severity end plate spondylosis. Mild disc space narrowing is seen. Bilateral facet hypertrophy is noted. Normal central canal and intervertebral neuroforamina. C5-6: There is mild to moderate severity end plate spondylosis. Mild disc space narrowing is seen. Bilateral facet hypertrophy is noted. Normal central canal and intervertebral neuroforamina. C6-7: There is mild to moderate severity end plate spondylosis. Mild disc space narrowing is seen. Bilateral facet hypertrophy is noted. Normal central canal and intervertebral neuroforamina. C7-T1: There is mild end plate spondylosis. Mild disc space narrowing is seen. Bilateral facet hypertrophy is noted. Normal central canal and intervertebral neuroforamina. Upper chest: Negative. Other: None. IMPRESSION: 1. Multilevel degenerative changes without evidence of an acute fracture. Electronically Signed   By: Aram Candela M.D.   On: 05/15/2019 20:06   CT Cervical Spine Wo Contrast  Result Date: 05/15/2019 CLINICAL DATA:  Fall EXAM: CT CERVICAL SPINE WITHOUT CONTRAST TECHNIQUE: Multidetector CT imaging of the cervical spine was performed without intravenous contrast. Multiplanar CT image reconstructions were also generated. COMPARISON:  05/13/2022 FINDINGS: Alignment: Normal. Skull base and vertebrae: No acute fracture. No primary bone lesion or focal pathologic process. Soft tissues and spinal canal: No prevertebral fluid or swelling. No visible canal hematoma. Disc levels:  Diffuse degenerative disc disease and facet disease Upper chest: No acute findings Other: None IMPRESSION: No acute bony abnormality in the cervical spine. Electronically Signed   By: Charlett Nose M.D.   On: 05/15/2019 03:23   CT Cervical Spine Wo Contrast  Result Date: 05/14/2019 CLINICAL DATA:  Fall on  blood thinners, unwitnessed, right forehead hematoma EXAM: CT HEAD WITHOUT CONTRAST CT CERVICAL SPINE WITHOUT CONTRAST TECHNIQUE: Multidetector CT imaging of the head and cervical spine was performed following the standard protocol without intravenous contrast. Multiplanar CT image reconstructions of the cervical spine were also generated. COMPARISON:  CT head and cervical spine 05/09/2019 FINDINGS: CT HEAD FINDINGS Brain: No evidence of acute infarction, hemorrhage, hydrocephalus, extra-axial collection or mass lesion/mass effect. Symmetric prominence of the ventricles, cisterns and sulci compatible with parenchymal volume loss. Dilatation of the ventricles is somewhat out of proportion to the sulci. Extensive patchy and confluent areas of white matter hypoattenuation are most compatible with chronic microvascular angiopathy. Vascular: Atherosclerotic calcification of the carotid siphons. No hyperdense vessel. Skull: There is right frontal and supraorbital scalp swelling and thickening. Small left occipital scalp contusion is noted as well. No subjacent calvarial or visible acute facial bone fracture is seen. Postsurgical changes from prior surgical repair of the right  inferior orbital rim and maxillary sinus. Anterior maxillary sinus. Remote posttraumatic deformity of the nasal bones is similar to prior. Sinuses/Orbits: No retro septal gas or stranding in either orbit. Globes appear normal and symmetric. The lenses remain orthotopic. Other: None CT CERVICAL SPINE FINDINGS Alignment: Preservation of the normal cervical lordosis without traumatic listhesis. No abnormal facet widening. Normal alignment of the craniocervical and atlantoaxial articulations. Skull base and vertebrae: No acute fracture. No primary bone lesion or focal pathologic process. Soft tissues and spinal canal: No pre or paravertebral fluid or swelling. No visible canal hematoma. Disc levels: Multilevel intervertebral disc height loss with  spondylitic endplate changes. Large disc osteophyte complexes at C2-3 and C3-4 result in at least moderate canal stenosis. Uncinate spurring and facet hypertrophic changes result in moderate bilateral neural foraminal narrowing at C3-4 as well. Upper chest: No acute abnormality in the upper chest or imaged lung apices. Other: None IMPRESSION: 1. No acute intracranial abnormality. 2. Right frontal and supraorbital scalp swelling. Small left occipital scalp contusion. No subjacent calvarial or facial bone fracture identified. 3. Extensive volume loss and chronic microvascular angiopathy is similar to prior. Dilatation of the ventricles out of proportion to the sulci could reflect some underlying normal pressure hydrocephaly. 4. Remote posttraumatic deformity of the nasal bones. 5. Postsurgical changes from prior right orbital rim reconstruction. 6. No acute fracture of the cervical spine. 7. Multilevel intervertebral disc height loss with spondylitic changes. Disc osteophyte complexes at C2-3 and C3-4 results in at least moderate spinal canal stenosis. Moderate bilateral neural foraminal narrowing at C3-4 as well. Electronically Signed   By: Kreg ShropshirePrice  DeHay M.D.   On: 05/14/2019 03:21   DG Femur Min 2 Views Right  Result Date: 05/15/2019 CLINICAL DATA:  Fall EXAM: RIGHT FEMUR 2 VIEWS COMPARISON:  Pelvis and bilateral hip 05/15/2019 FINDINGS: There is no evidence of fracture or other focal bone lesions. Soft tissues are unremarkable. IMPRESSION: Negative. Electronically Signed   By: Charlett NoseKevin  Dover M.D.   On: 05/15/2019 03:10   DG Hips Bilat W or Wo Pelvis 3-4 Views  Result Date: 05/15/2019 CLINICAL DATA:  Fall EXAM: DG HIP (WITH OR WITHOUT PELVIS) 3-4V BILAT COMPARISON:  05/15/2019 FINDINGS: SI joints are non widened. Pubic symphysis and rami are intact. No fracture or malalignment. IMPRESSION: No acute osseous abnormality. Electronically Signed   By: Jasmine PangKim  Fujinaga M.D.   On: 05/15/2019 20:20   DG Hips Bilat W or  Wo Pelvis 3-4 Views  Result Date: 05/15/2019 CLINICAL DATA:  Fall EXAM: DG HIP (WITH OR WITHOUT PELVIS) 3-4V BILAT COMPARISON:  None. FINDINGS: There is no evidence of hip fracture or dislocation. There is no evidence of arthropathy or other focal bone abnormality. IMPRESSION: Negative. Electronically Signed   By: Charlett NoseKevin  Dover M.D.   On: 05/15/2019 03:11    Procedures Procedures (including critical care time)  Medications Ordered in ED Medications - No data to display  ED Course  I have reviewed the triage vital signs and the nursing notes.  Pertinent labs & imaging results that were available during my care of the patient were reviewed by me and considered in my medical decision making (see chart for details).    MDM Rules/Calculators/A&P                      Patient rescanned CT head neck no acute injuries.  Also repeated bilateral hip and pelvis no acute injuries.  Patient did hit his head where this wounds were sutured.  May be a little bit of abrasion in that area with sutures in general intact.  He does have local wound care.  Patient stable to go back to nursing facility.    Final Clinical Impression(s) / ED Diagnoses Final diagnoses:  Fall, initial encounter  Injury of head, initial encounter    Rx / DC Orders ED Discharge Orders    None       Vanetta Mulders, MD 05/15/19 2250

## 2019-05-15 NOTE — ED Triage Notes (Signed)
Per EMS, patient from [facility], seen this morning for fall. Sent out for evaluation after hitting head on wall while ambulating and re-injuring hematoma to forehead. Staff denies LOC. Hx dementia.

## 2019-05-15 NOTE — ED Notes (Signed)
Called [facility] 

## 2019-05-18 ENCOUNTER — Other Ambulatory Visit: Payer: Self-pay

## 2019-05-18 ENCOUNTER — Emergency Department (HOSPITAL_COMMUNITY)
Admission: EM | Admit: 2019-05-18 | Discharge: 2019-05-18 | Disposition: A | Discharge status: Home / Self Care | Payer: BC Managed Care – PPO | Attending: Emergency Medicine | Admitting: Emergency Medicine

## 2019-05-18 ENCOUNTER — Encounter (HOSPITAL_COMMUNITY): Payer: Self-pay | Admitting: Emergency Medicine

## 2019-05-18 ENCOUNTER — Emergency Department (HOSPITAL_COMMUNITY): Payer: BC Managed Care – PPO

## 2019-05-18 DIAGNOSIS — W19XXXA Unspecified fall, initial encounter: Secondary | ICD-10-CM | POA: Insufficient documentation

## 2019-05-18 DIAGNOSIS — F0280 Dementia in other diseases classified elsewhere without behavioral disturbance: Secondary | ICD-10-CM | POA: Diagnosis not present

## 2019-05-18 DIAGNOSIS — Y939 Activity, unspecified: Secondary | ICD-10-CM | POA: Insufficient documentation

## 2019-05-18 DIAGNOSIS — Z79899 Other long term (current) drug therapy: Secondary | ICD-10-CM | POA: Diagnosis not present

## 2019-05-18 DIAGNOSIS — Y999 Unspecified external cause status: Secondary | ICD-10-CM | POA: Insufficient documentation

## 2019-05-18 DIAGNOSIS — Y92129 Unspecified place in nursing home as the place of occurrence of the external cause: Secondary | ICD-10-CM | POA: Insufficient documentation

## 2019-05-18 DIAGNOSIS — S0990XA Unspecified injury of head, initial encounter: Secondary | ICD-10-CM | POA: Diagnosis present

## 2019-05-18 DIAGNOSIS — S0181XA Laceration without foreign body of other part of head, initial encounter: Secondary | ICD-10-CM | POA: Diagnosis not present

## 2019-05-18 DIAGNOSIS — G300 Alzheimer's disease with early onset: Secondary | ICD-10-CM | POA: Diagnosis not present

## 2019-05-18 MED ORDER — BACITRACIN ZINC 500 UNIT/GM EX OINT
TOPICAL_OINTMENT | Freq: Two times a day (BID) | CUTANEOUS | Status: DC
  Filled 2019-05-18: qty 2.7

## 2019-05-18 MED ORDER — LIDOCAINE-EPINEPHRINE (PF) 2 %-1:200000 IJ SOLN
10.0000 mL | Freq: Once | INTRAMUSCULAR | Status: AC
  Administered 2019-05-18: 10 mL

## 2019-05-18 MED ORDER — LIDOCAINE-EPINEPHRINE (PF) 2 %-1:200000 IJ SOLN
INTRAMUSCULAR | Status: AC
  Filled 2019-05-18: qty 20

## 2019-05-18 NOTE — ED Triage Notes (Signed)
Patient here from [facility] for unwitnessed fall this morning. Head lac and nose lac noted. Bleeding controlled. No blood thinners. Hx of dementia. Seen for same 1/28.

## 2019-05-18 NOTE — ED Notes (Addendum)
Patient found to be standing in between in the rails of the bed. Patient sat down, given a sandwich and a drink. [facility] called for transport back to [facility]. [facility] called and notified of patient's return.

## 2019-05-18 NOTE — Discharge Instructions (Addendum)
You had a laceration in your forehead that was repaired with dissolvable sutures.  The stitches will dissolve their own in a few days.  Keep the area clean and dry.

## 2019-05-18 NOTE — ED Provider Notes (Signed)
[place] DEPT Provider Note   CSN: [id number] Arrival date & time: 05/18/19  9509     History Chief Complaint  Patient presents with  . Fall  . Head Laceration    Kenneth Mcdaniel is a 64 y.o. male.  HPI    64 year old male with early onset Alzheimer's disease comes in a chief complaint of fall.  Level 5 caveat for altered mental status.  Patient had an unwitnessed fall, and is noted to have laceration to his forehead.  Patient has no complaints from his side.  Past Medical History:  Diagnosis Date  . Agitation   . Early onset Alzheimer's dementia ([place])    from [facility]  . Hyperlipidemia   . PVD (peripheral vascular disease) [facility]     Patient Active Problem List   Diagnosis Date Noted  . Sepsis secondary to UTI ([place]) 05/01/2019  . Acute encephalopathy 05/01/2019  . MSSA bacteremia 05/01/2019  . UTI (urinary tract infection) 11/20/2018  . Seizure ([place]) 11/20/2018  . Sepsis ([place]) 11/20/2018  . Dementia ([place]) 03/21/2017    No past surgical history on file.     Family History  Problem Relation Age of Onset  . Stroke Mother   . Diabetes Neg Hx   . Hypertension Neg Hx     Social History   Tobacco Use  . Smoking status: Never Smoker  . Smokeless tobacco: Never Used  Substance Use Topics  . Alcohol use: Never  . Drug use: No    Home Medications Prior to Admission medications   Medication Sig Start Date End Date Taking? Authorizing Provider  acetaminophen (TYLENOL) 500 MG tablet Take 1 tablet (500 mg total) by mouth every 4 (four) hours. Please only give while awake. 05/05/19   [name], MD  ALPRAZolam [name]) 0.5 MG tablet Take 1 tablet (0.5 mg total) by mouth 2 (two) times daily. 11/23/18   [name], MD  ALPRAZolam [name]) 1 MG tablet Take 1 tablet (1 mg total) by mouth at bedtime. 11/23/18   [name], MD  Cholecalciferol (VITAMIN D-3) 125 MCG (5000 UT) TABS Take 5,000 Units by mouth daily. 05/05/19    [name], MD  divalproex (DEPAKOTE SPRINKLE) 125 MG capsule Take 4 capsules (500 mg total) by mouth 2 (two) times daily. 12/21/18   [name], MD  feeding supplement, GLUCERNA SHAKE, (GLUCERNA SHAKE) LIQD Take 237 mLs by mouth See admin instructions. Drink 1 shake (237 ml's) by mouth three times a day and with any snacks (CHOCOLATE)     [provider]  linezolid (ZYVOX) 600 MG tablet Take 600 mg by mouth 2 (two) times daily.    [provider]  mirtazapine (REMERON) 15 MG tablet Take 15 mg by mouth at bedtime.    [provider]  traZODone (DESYREL) 100 MG tablet Take 100 mg by mouth at bedtime.  08/16/18   [provider]  zinc gluconate 50 MG tablet Take 2 tablets (100 mg total) by mouth daily. 05/05/19   [name], MD    Allergies    Patient has no known allergies.  Review of Systems   Review of Systems  Unable to perform ROS: Mental status change    Physical Exam Updated Vital Signs BP 102/67 (BP Location: Right Arm)   Pulse 73   Temp 98.6 F (37 C) (Oral)   Resp 17   SpO2 100%   Physical Exam Vitals and nursing note reviewed.  Constitutional:  Appearance: He is well-developed.  Cardiovascular:     Rate and Rhythm: Normal rate.  Pulmonary:     Effort: Pulmonary effort is normal.  Musculoskeletal:     Cervical back: Neck supple.  Skin:    General: Skin is warm.     Findings: Bruising and erythema present.     Comments: Patient had 2 lacerations to his forehead, measuring 3 cm in total.  Wound is deep.  Active bleeding.  Neurological:     Mental Status: He is alert and oriented to person, place, and time.     ED Results / Procedures / Treatments   Labs (all labs ordered are listed, but only abnormal results are displayed) Labs Reviewed - No data to display  EKG None  Radiology CT Head Wo Contrast  Result Date: 05/18/2019 CLINICAL DATA:  Unwitnessed fall.  Trauma to the head and neck. EXAM: CT HEAD  WITHOUT CONTRAST CT CERVICAL SPINE WITHOUT CONTRAST TECHNIQUE: Multidetector CT imaging of the head and cervical spine was performed following the standard protocol without intravenous contrast. Multiplanar CT image reconstructions of the cervical spine were also generated. COMPARISON:  05/15/2019 FINDINGS: CT HEAD FINDINGS Brain: Advanced generalized atrophy, premature for age. Chronic ventriculomegaly presumed due to central volume loss. No sign of acute infarction, mass lesion, hemorrhage, hydrocephalus or extra-axial collection. Vascular: No abnormal vascular finding. Skull: No skull fracture. Sinuses/Orbits: Old right facial fractures. No acute inflammatory sinus disease. Other: Forehead soft tissue swelling is diminishing since the study of 3 days ago. CT CERVICAL SPINE FINDINGS Alignment: No traumatic malalignment. Skull base and vertebrae: No fracture or focal bone lesion. Soft tissues and spinal canal: Negative Disc levels: Chronic degenerative spondylosis at C2-3, C3-4 and C4-5. Chronic facet osteoarthritis most pronounced on the right at C2-3 and C3-4 and on the left at C4-5. Upper chest: Negative Other: None IMPRESSION: Head CT: No acute traumatic finding. Advanced generalized atrophy and ventriculomegaly secondary to central volume loss. Diminishing forehead hematoma compared to the study of 3 days ago. Cervical spine CT: No acute or traumatic finding. Chronic degenerative spondylosis and facet osteoarthritis. Electronically Signed   By: Paulina Fusi M.D.   On: 05/18/2019 06:47   CT Cervical Spine Wo Contrast  Result Date: 05/18/2019 CLINICAL DATA:  Unwitnessed fall.  Trauma to the head and neck. EXAM: CT HEAD WITHOUT CONTRAST CT CERVICAL SPINE WITHOUT CONTRAST TECHNIQUE: Multidetector CT imaging of the head and cervical spine was performed following the standard protocol without intravenous contrast. Multiplanar CT image reconstructions of the cervical spine were also generated. COMPARISON:   05/15/2019 FINDINGS: CT HEAD FINDINGS Brain: Advanced generalized atrophy, premature for age. Chronic ventriculomegaly presumed due to central volume loss. No sign of acute infarction, mass lesion, hemorrhage, hydrocephalus or extra-axial collection. Vascular: No abnormal vascular finding. Skull: No skull fracture. Sinuses/Orbits: Old right facial fractures. No acute inflammatory sinus disease. Other: Forehead soft tissue swelling is diminishing since the study of 3 days ago. CT CERVICAL SPINE FINDINGS Alignment: No traumatic malalignment. Skull base and vertebrae: No fracture or focal bone lesion. Soft tissues and spinal canal: Negative Disc levels: Chronic degenerative spondylosis at C2-3, C3-4 and C4-5. Chronic facet osteoarthritis most pronounced on the right at C2-3 and C3-4 and on the left at C4-5. Upper chest: Negative Other: None IMPRESSION: Head CT: No acute traumatic finding. Advanced generalized atrophy and ventriculomegaly secondary to central volume loss. Diminishing forehead hematoma compared to the study of 3 days ago. Cervical spine CT: No acute or traumatic finding. Chronic degenerative spondylosis  and facet osteoarthritis. Electronically Signed   By: Paulina Fusi M.D.   On: 05/18/2019 06:47    Procedures .Marland KitchenLaceration Repair  Date/Time: 05/18/2019 7:06 AM Performed by: Derwood Kaplan, MD Authorized by: Derwood Kaplan, MD   Consent:    Consent obtained:  Verbal   Consent given by:  Spouse   Risks discussed:  Pain and poor wound healing Anesthesia (see MAR for exact dosages):    Anesthesia method:  Local infiltration   Local anesthetic:  Lidocaine 2% WITH epi Laceration details:    Location:  Face   Face location:  Forehead   Length (cm):  3   Depth (mm):  5 Repair type:    Repair type:  Intermediate Pre-procedure details:    Preparation:  Patient was prepped and draped in usual sterile fashion Exploration:    Wound exploration: wound explored through full range of motion  and entire depth of wound probed and visualized     Contaminated: no   Treatment:    Area cleansed with:  Saline   Amount of cleaning:  Standard   Irrigation solution:  Tap water   Irrigation method:  Syringe   Visualized foreign bodies/material removed: no   Skin repair:    Repair method:  Sutures   Suture size:  5-0   Suture technique:  Simple interrupted   Number of sutures:  4 Approximation:    Approximation:  Close Post-procedure details:    Dressing:  Antibiotic ointment   Patient tolerance of procedure:  Tolerated well, no immediate complications   (including critical care time)  Medications Ordered in ED Medications  lidocaine-EPINEPHrine (XYLOCAINE W/EPI) 2 %-1:200000 (PF) injection (has no administration in time range)  lidocaine-EPINEPHrine (XYLOCAINE W/EPI) 2 %-1:200000 (PF) injection 10 mL (10 mLs Infiltration Given 05/18/19 0300)    ED Course  I have reviewed the triage vital signs and the nursing notes.  Pertinent labs & imaging results that were available during my care of the patient were reviewed by me and considered in my medical decision making (see chart for details).    MDM Rules/Calculators/A&P                      DDx includes: - Mechanical falls - ICH - Fractures - Contusions - Soft tissue injury  Pt comes in with mechanical fall.  He has early onset advanced dementia and has history of multiple falls.  He has laceration to the forehead that will need repair. CT head and C-spine ordered and they are negative.  Final Clinical Impression(s) / ED Diagnoses Final diagnoses:  Facial laceration, initial encounter    Rx / DC Orders ED Discharge Orders    None       Derwood Kaplan, MD 05/18/19 [id number]

## 2019-05-19 ENCOUNTER — Other Ambulatory Visit: Payer: Self-pay

## 2019-05-19 ENCOUNTER — Encounter (HOSPITAL_COMMUNITY): Payer: Self-pay | Admitting: Emergency Medicine

## 2019-05-19 ENCOUNTER — Emergency Department (HOSPITAL_COMMUNITY): Payer: BC Managed Care – PPO

## 2019-05-19 ENCOUNTER — Emergency Department (HOSPITAL_COMMUNITY)
Admission: EM | Admit: 2019-05-19 | Discharge: 2019-05-19 | Disposition: A | Discharge status: Skilled Nursing Facility | Payer: BC Managed Care – PPO | Attending: Emergency Medicine | Admitting: Emergency Medicine

## 2019-05-19 DIAGNOSIS — Z79899 Other long term (current) drug therapy: Secondary | ICD-10-CM | POA: Diagnosis not present

## 2019-05-19 DIAGNOSIS — Y999 Unspecified external cause status: Secondary | ICD-10-CM | POA: Diagnosis not present

## 2019-05-19 DIAGNOSIS — G309 Alzheimer's disease, unspecified: Secondary | ICD-10-CM | POA: Diagnosis not present

## 2019-05-19 DIAGNOSIS — W19XXXA Unspecified fall, initial encounter: Secondary | ICD-10-CM

## 2019-05-19 DIAGNOSIS — W07XXXA Fall from chair, initial encounter: Secondary | ICD-10-CM | POA: Diagnosis not present

## 2019-05-19 DIAGNOSIS — Y929 Unspecified place or not applicable: Secondary | ICD-10-CM | POA: Diagnosis not present

## 2019-05-19 DIAGNOSIS — S0181XA Laceration without foreign body of other part of head, initial encounter: Secondary | ICD-10-CM | POA: Insufficient documentation

## 2019-05-19 DIAGNOSIS — Y939 Activity, unspecified: Secondary | ICD-10-CM | POA: Insufficient documentation

## 2019-05-19 DIAGNOSIS — F0280 Dementia in other diseases classified elsewhere without behavioral disturbance: Secondary | ICD-10-CM | POA: Insufficient documentation

## 2019-05-19 DIAGNOSIS — S0990XA Unspecified injury of head, initial encounter: Secondary | ICD-10-CM | POA: Diagnosis present

## 2019-05-19 NOTE — ED Notes (Addendum)
This RN contacted [facility] regarding transportation and spoke to [facility] to inform them of patient's return to facility. Patient unable to sign for discharge due to dementia.

## 2019-05-19 NOTE — Discharge Instructions (Addendum)
As discussed, your CT scans were negative. You may take over the counter ibuprofen or tylenol as needed for pain. Follow-up with your PCP within the next week for further evaluation. Return to the ER for new or worsening symptoms. The steri strips on your forehead will fall off on their own. Do not rip off.

## 2019-05-19 NOTE — ED Provider Notes (Signed)
[facility]-EMERGENCY DEPT Provider Note   CSN: [id number] Arrival date & time: 05/19/19  1835     History Chief Complaint  Patient presents with  . Fall    CASHAWN YANKO is a 64 y.o. male with a past medical history significant for hyperlipidemia, peripheral vascular disease, and Alzheimer's disease who presents to the ED after falling from a seated position after dinner, 30 minutes prior to arrival.  Spoke to employee at [employer] who notes that patient was seated eating dinner when the aide turned around and found him on the ground with a puddle of blood coming from his head.  Patient was seen in the ED yesterday for another fall where 4 sutures were placed on his forehead. Per chart review, patient is not on any blood thinners.  Level 5 caveat due to Alzheimer's disease.     Past Medical History:  Diagnosis Date  . Agitation   . Early onset Alzheimer's dementia ([facility])    from [facility]  . Hyperlipidemia   . PVD (peripheral vascular disease) [facility])     Patient Active Problem List   Diagnosis Date Noted  . Sepsis secondary to UTI ([facility]) 05/01/2019  . Acute encephalopathy 05/01/2019  . MSSA bacteremia 05/01/2019  . UTI (urinary tract infection) 11/20/2018  . Seizure ([facility]) 11/20/2018  . Sepsis ([facility]) 11/20/2018  . Dementia ([facility]) 03/21/2017    History reviewed. No pertinent surgical history.     Family History  Problem Relation Age of Onset  . Stroke Mother   . Diabetes Neg Hx   . Hypertension Neg Hx     Social History   Tobacco Use  . Smoking status: Never Smoker  . Smokeless tobacco: Never Used  Substance Use Topics  . Alcohol use: Never  . Drug use: No    Home Medications Prior to Admission medications   Medication Sig Start Date End Date Taking? Authorizing Provider  ALPRAZolam Prudy Feeler) 0.5 MG tablet Take 1 tablet (0.5 mg total) by mouth 2 (two) times daily. 11/23/18  Yes Sharl Ma, Sarina Ill, MD  ALPRAZolam Prudy Feeler) 1 MG tablet Take  1 tablet (1 mg total) by mouth at bedtime. 11/23/18  Yes Meredeth Ide, MD  divalproex (DEPAKOTE SPRINKLE) 125 MG capsule Take 4 capsules (500 mg total) by mouth 2 (two) times daily. 12/21/18  Yes Margarita Grizzle, MD  feeding supplement, GLUCERNA SHAKE, (GLUCERNA SHAKE) LIQD Take 237 mLs by mouth See admin instructions. Drink 1 shake (237 ml's) by mouth three times a day and with any snacks (CHOCOLATE)    Yes [provider]  mirtazapine (REMERON) 15 MG tablet Take 15 mg by mouth at bedtime.   Yes [provider]  traZODone (DESYREL) 100 MG tablet Take 100 mg by mouth at bedtime.  08/16/18  Yes [provider]  acetaminophen (TYLENOL) 500 MG tablet Take 1 tablet (500 mg total) by mouth every 4 (four) hours. Please only give while awake. Patient not taking: Reported on 05/19/2019 05/05/19   [name], MD  Cholecalciferol (VITAMIN D-3) 125 MCG (5000 UT) TABS Take 5,000 Units by mouth daily. Patient not taking: Reported on 05/19/2019 05/05/19   [name], MD  zinc gluconate 50 MG tablet Take 2 tablets (100 mg total) by mouth daily. Patient not taking: Reported on 05/19/2019 05/05/19   [name], MD    Allergies    Patient has no known allergies.  Review of Systems   Review of Systems  Unable to perform ROS: Dementia  Physical Exam Updated Vital Signs BP 124/78 (BP Location: Right Arm)   Pulse 70   Temp 98.6 F (37 C) (Oral)   Resp 18   Ht 5\' 9"  (1.753 m)   Wt 48 kg   SpO2 100%   BMI 15.63 kg/m   Physical Exam Vitals and nursing note reviewed.  Constitutional:      General: He is not in acute distress.    Appearance: He is not toxic-appearing.     Comments: Sleeping upon initial evaluation, but able to arouse.   HENT:     Head: Normocephalic.     Comments: Laceration on right side of forehead.  Eyes:     Pupils: Pupils are equal, round, and reactive to light.     Comments: Right racoon eye  Neck:     Comments: No midline cervical  tenderness. C-collar in place.  Cardiovascular:     Rate and Rhythm: Normal rate and regular rhythm.     Pulses: Normal pulses.     Heart sounds: Normal heart sounds. No murmur. No friction rub. No gallop.   Pulmonary:     Effort: Pulmonary effort is normal.     Breath sounds: Normal breath sounds.  Abdominal:     General: Abdomen is flat. There is no distension.     Palpations: Abdomen is soft.     Tenderness: There is no abdominal tenderness. There is no guarding or rebound.  Musculoskeletal:     Cervical back: Neck supple.     Comments: Moves all 4 extremities without difficulty.  Skin:    General: Skin is warm and dry.     Capillary Refill: Capillary refill takes less than 2 seconds.  Neurological:     General: No focal deficit present.     Mental Status: He is alert. Mental status is at baseline.         ED Results / Procedures / Treatments   Labs (all labs ordered are listed, but only abnormal results are displayed) Labs Reviewed - No data to display  EKG None  Radiology CT Head Wo Contrast  Result Date: 05/18/2019 CLINICAL DATA:  Unwitnessed fall.  Trauma to the head and neck. EXAM: CT HEAD WITHOUT CONTRAST CT CERVICAL SPINE WITHOUT CONTRAST TECHNIQUE: Multidetector CT imaging of the head and cervical spine was performed following the standard protocol without intravenous contrast. Multiplanar CT image reconstructions of the cervical spine were also generated. COMPARISON:  05/15/2019 FINDINGS: CT HEAD FINDINGS Brain: Advanced generalized atrophy, premature for age. Chronic ventriculomegaly presumed due to central volume loss. No sign of acute infarction, mass lesion, hemorrhage, hydrocephalus or extra-axial collection. Vascular: No abnormal vascular finding. Skull: No skull fracture. Sinuses/Orbits: Old right facial fractures. No acute inflammatory sinus disease. Other: Forehead soft tissue swelling is diminishing since the study of 3 days ago. CT CERVICAL SPINE FINDINGS  Alignment: No traumatic malalignment. Skull base and vertebrae: No fracture or focal bone lesion. Soft tissues and spinal canal: Negative Disc levels: Chronic degenerative spondylosis at C2-3, C3-4 and C4-5. Chronic facet osteoarthritis most pronounced on the right at C2-3 and C3-4 and on the left at C4-5. Upper chest: Negative Other: None IMPRESSION: Head CT: No acute traumatic finding. Advanced generalized atrophy and ventriculomegaly secondary to central volume loss. Diminishing forehead hematoma compared to the study of 3 days ago. Cervical spine CT: No acute or traumatic finding. Chronic degenerative spondylosis and facet osteoarthritis. Electronically Signed   By: 05/17/2019 M.D.   On: 05/18/2019 06:47   CT  Cervical Spine Wo Contrast  Result Date: 05/18/2019 CLINICAL DATA:  Unwitnessed fall.  Trauma to the head and neck. EXAM: CT HEAD WITHOUT CONTRAST CT CERVICAL SPINE WITHOUT CONTRAST TECHNIQUE: Multidetector CT imaging of the head and cervical spine was performed following the standard protocol without intravenous contrast. Multiplanar CT image reconstructions of the cervical spine were also generated. COMPARISON:  05/15/2019 FINDINGS: CT HEAD FINDINGS Brain: Advanced generalized atrophy, premature for age. Chronic ventriculomegaly presumed due to central volume loss. No sign of acute infarction, mass lesion, hemorrhage, hydrocephalus or extra-axial collection. Vascular: No abnormal vascular finding. Skull: No skull fracture. Sinuses/Orbits: Old right facial fractures. No acute inflammatory sinus disease. Other: Forehead soft tissue swelling is diminishing since the study of 3 days ago. CT CERVICAL SPINE FINDINGS Alignment: No traumatic malalignment. Skull base and vertebrae: No fracture or focal bone lesion. Soft tissues and spinal canal: Negative Disc levels: Chronic degenerative spondylosis at C2-3, C3-4 and C4-5. Chronic facet osteoarthritis most pronounced on the right at C2-3 and C3-4 and on the  left at C4-5. Upper chest: Negative Other: None IMPRESSION: Head CT: No acute traumatic finding. Advanced generalized atrophy and ventriculomegaly secondary to central volume loss. Diminishing forehead hematoma compared to the study of 3 days ago. Cervical spine CT: No acute or traumatic finding. Chronic degenerative spondylosis and facet osteoarthritis. Electronically Signed   By: Paulina Fusi M.D.   On: 05/18/2019 06:47    Procedures .Marland KitchenLaceration Repair  Date/Time: 05/19/2019 8:57 PM Performed by: Mannie Stabile, PA-C Authorized by: Mannie Stabile, PA-C   Consent:    Consent obtained:  Verbal   Consent given by:  Patient   Risks discussed:  Poor cosmetic result, poor wound healing, infection and pain   Alternatives discussed:  No treatment Anesthesia (see MAR for exact dosages):    Anesthesia method:  None Laceration details:    Location:  Face   Face location:  Forehead   Length (cm):  2   Depth (mm):  3 Repair type:    Repair type:  Simple Pre-procedure details:    Preparation:  Patient was prepped and draped in usual sterile fashion Exploration:    Hemostasis achieved with:  Direct pressure   Wound exploration: wound explored through full range of motion and entire depth of wound probed and visualized     Wound extent: no fascia violation noted, no foreign bodies/material noted, no muscle damage noted, no tendon damage noted and no vascular damage noted     Contaminated: no   Treatment:    Area cleansed with:  Saline   Amount of cleaning:  Standard   Irrigation solution:  Sterile saline   Irrigation method:  Syringe   Visualized foreign bodies/material removed: no   Skin repair:    Repair method:  Steri-Strips and tissue adhesive   Number of Steri-Strips:  2 Approximation:    Approximation:  Close Post-procedure details:    Dressing:  Open (no dressing)   Patient tolerance of procedure:  Tolerated well, no immediate complications Comments:     Steri strips  applied over previous sutures with dermabond on top.    (including critical care time)  Medications Ordered in ED Medications - No data to display  ED Course  I have reviewed the triage vital signs and the nursing notes.  Pertinent labs & imaging results that were available during my care of the patient were reviewed by me and considered in my medical decision making (see chart for details).    MDM Rules/Calculators/A&P  64 year old male presents to the ED via EMS after an unwitnessed fall just prior to arrival. Per employee at [facility], patient was sitting down for dinner and then was found on the ground around a puddle of blood coming from his forehead. Patient was seen yesterday in the ED for another fall where 4 sutures were placed in his forehead. Vitals all within normal limits. Patient in no acute distress. Will obtain CT head and c-spine to rule out intracranial abnormalities and bony fractures.   Forehead wound repaired with steri strips and dermabond. Previous sutures still present.   Patient handed off to [facility], PA-C who will follow-up with CT scans, reassess patient, and determine disposition. If CT scans are normal, patient may be discharged to [facility].  Final Clinical Impression(s) / ED Diagnoses Final diagnoses:  Fall, initial encounter    Rx / DC Orders ED Discharge Orders    None       Karie Kirks 05/19/19 2105    Quintella Reichert, MD 05/22/19 [phone]

## 2019-05-19 NOTE — ED Triage Notes (Signed)
From [facility], witnessed fall yesterday (pt hit forehead on ground yesterday, laceration on forehead). Larey Seat again today approx 30 minutes ago. C-collar placed, no LOC.   Hx of dementia  BP 118 pp P 76 100% RA CBG 116 T 98.7

## 2019-05-23 ENCOUNTER — Emergency Department (HOSPITAL_COMMUNITY)
Admission: EM | Admit: 2019-05-23 | Discharge: 2019-05-24 | Disposition: A | Discharge status: Skilled Nursing Facility | Payer: BC Managed Care – PPO | Attending: Emergency Medicine | Admitting: Emergency Medicine

## 2019-05-23 ENCOUNTER — Encounter (HOSPITAL_COMMUNITY): Payer: Self-pay

## 2019-05-23 ENCOUNTER — Other Ambulatory Visit: Payer: Self-pay

## 2019-05-23 DIAGNOSIS — S0181XA Laceration without foreign body of other part of head, initial encounter: Secondary | ICD-10-CM | POA: Insufficient documentation

## 2019-05-23 DIAGNOSIS — G309 Alzheimer's disease, unspecified: Secondary | ICD-10-CM | POA: Insufficient documentation

## 2019-05-23 DIAGNOSIS — W19XXXA Unspecified fall, initial encounter: Secondary | ICD-10-CM | POA: Insufficient documentation

## 2019-05-23 DIAGNOSIS — Y939 Activity, unspecified: Secondary | ICD-10-CM | POA: Insufficient documentation

## 2019-05-23 DIAGNOSIS — S0993XA Unspecified injury of face, initial encounter: Secondary | ICD-10-CM | POA: Diagnosis present

## 2019-05-23 DIAGNOSIS — F0280 Dementia in other diseases classified elsewhere without behavioral disturbance: Secondary | ICD-10-CM | POA: Insufficient documentation

## 2019-05-23 DIAGNOSIS — Z79899 Other long term (current) drug therapy: Secondary | ICD-10-CM | POA: Insufficient documentation

## 2019-05-23 DIAGNOSIS — Y929 Unspecified place or not applicable: Secondary | ICD-10-CM | POA: Insufficient documentation

## 2019-05-23 DIAGNOSIS — Y999 Unspecified external cause status: Secondary | ICD-10-CM | POA: Diagnosis not present

## 2019-05-23 NOTE — ED Triage Notes (Signed)
Per EMS,  Pt is coming from [facility]. Pt does have dementia, alert to baseline. Pt had an unwitnessed fall in his room. Pt has a laceration above left eye, and on nose. Pupils were equal and reactive. Pt is a hospice pt.

## 2019-05-23 NOTE — ED Notes (Signed)
Called 2x times to give report, no answer.

## 2019-05-23 NOTE — ED Provider Notes (Signed)
[facility] DEPT Provider Note   CSN: [id number] Arrival date & time: 05/23/19  2021     History No chief complaint on file.   Kenneth Mcdaniel is a 64 y.o. male.  HPI Patient has Alzheimer's disease.  He sustained frequent falls.  He had an unwitnessed fall in his room.  Patient was found a laceration above the left eye.  Patient sent to the emergency department for evaluation.    Past Medical History:  Diagnosis Date  . Agitation   . Early onset Alzheimer's dementia ([place])    from [facility]  . Hyperlipidemia   . PVD (peripheral vascular disease) [facility])     Patient Active Problem List   Diagnosis Date Noted  . Sepsis secondary to UTI ([place]) 05/01/2019  . Acute encephalopathy 05/01/2019  . MSSA bacteremia 05/01/2019  . UTI (urinary tract infection) 11/20/2018  . Seizure ([place]) 11/20/2018  . Sepsis ([place]) 11/20/2018  . Dementia ([place]) 03/21/2017    No past surgical history on file.     Family History  Problem Relation Age of Onset  . Stroke Mother   . Diabetes Neg Hx   . Hypertension Neg Hx     Social History   Tobacco Use  . Smoking status: Never Smoker  . Smokeless tobacco: Never Used  Substance Use Topics  . Alcohol use: Never  . Drug use: No    Home Medications Prior to Admission medications   Medication Sig Start Date End Date Taking? Authorizing Provider  acetaminophen (TYLENOL) 500 MG tablet Take 1 tablet (500 mg total) by mouth every 4 (four) hours. Please only give while awake. Patient not taking: Reported on 05/19/2019 05/05/19   [name], MD  ALPRAZolam [name]) 0.5 MG tablet Take 1 tablet (0.5 mg total) by mouth 2 (two) times daily. 11/23/18   [name], MD  ALPRAZolam [name]) 1 MG tablet Take 1 tablet (1 mg total) by mouth at bedtime. 11/23/18   [name], MD  Cholecalciferol (VITAMIN D-3) 125 MCG (5000 UT) TABS Take 5,000 Units by mouth daily. Patient not taking: Reported on 05/19/2019 05/05/19    [name], MD  divalproex (DEPAKOTE SPRINKLE) 125 MG capsule Take 4 capsules (500 mg total) by mouth 2 (two) times daily. 12/21/18   [name], MD  feeding supplement, GLUCERNA SHAKE, (GLUCERNA SHAKE) LIQD Take 237 mLs by mouth See admin instructions. Drink 1 shake (237 ml's) by mouth three times a day and with any snacks (CHOCOLATE)     [provider]  mirtazapine (REMERON) 15 MG tablet Take 15 mg by mouth at bedtime.    [provider]  traZODone (DESYREL) 100 MG tablet Take 100 mg by mouth at bedtime.  08/16/18   [provider]  zinc gluconate 50 MG tablet Take 2 tablets (100 mg total) by mouth daily. Patient not taking: Reported on 05/19/2019 05/05/19   [name], MD    Allergies    Patient has no known allergies.  Review of Systems   Review of Systems Level 5 caveat cannot obtain review of systems due to dementia Physical Exam Updated Vital Signs There were no vitals taken for this visit.  Physical Exam Constitutional:      Comments: Patient is awake and alert.  Is wearing a cervical collar.  No respiratory distress.  HENT:     Head:     Comments: Patient has a fresh laceration above the left eye slightly obliquely oriented about 1.5 cm with slight  amount of gaping but no active bleeding.  Patient has previously repaired lacerations above the right eye and on the bridge of the nose.  These have scabbed eschar and sterestrips. Eyes:     Extraocular Movements: Extraocular movements intact.  Cardiovascular:     Rate and Rhythm: Normal rate and regular rhythm.  Pulmonary:     Effort: Pulmonary effort is normal.     Breath sounds: Normal breath sounds.  Abdominal:     General: There is no distension.     Palpations: Abdomen is soft.     Tenderness: There is no abdominal tenderness. There is no guarding.  Musculoskeletal:     Cervical back: Neck supple.     Comments: No deformities of extremities.  No significant abrasions or lacerations.   Both upper extremities have muscular atrophy.  Patient will make some motions to perform grip strength but is confused by the activity.  He does not seem to have any pain with range of motion of the upper extremities.  He is using them to lightly pick about with the blankets and his monitor leads.  I have put both lower extremities through flexion extension at the knee hip and ankle.  Patient expresses no pain or discomfort with this activity.  No deformities.  Skin:    General: Skin is warm and dry.  Neurological:     Comments: Patient is clearly confused.  He seems to answer some questions in the affirmative then starts making a repetitive type of sound.  He is mildly able to follow some commands.  When repairing his laceration he became slightly agitated and yelled in the clear voice a couple of times and was moving both of his upper extremities with good strength.     ED Results / Procedures / Treatments   Labs (all labs ordered are listed, but only abnormal results are displayed) Labs Reviewed - No data to display  EKG None  Radiology No results found.  Procedures Procedures (including critical care time)  Medications Ordered in ED Medications - No data to display  ED Course  I have reviewed the triage vital signs and the nursing notes.  Pertinent labs & imaging results that were available during my care of the patient were reviewed by me and considered in my medical decision making (see chart for details).    MDM Rules/Calculators/A&P                      At baseline, per review of EMR patient has severe Alzheimer's dementia.  Has had many falls and 7 CT scans of the head and cervical spine in January.  At this time, I do not feel that repeat scanning is indicated.  By exam and interaction he seems to be at baseline.  He is not expressing any pain or discomfort.  He had a minor laceration to the forehead repaired with tissue adhesive and Steri-Strips.  At this time he is  stable for discharge. Final Clinical Impression(s) / ED Diagnoses Final diagnoses:  Fall, initial encounter  Forehead laceration, initial encounter    Rx / DC Orders ED Discharge Orders    None       Arby Barrette, MD 05/23/19 2313

## 2019-05-23 NOTE — Discharge Instructions (Addendum)
1.  Patient had tissue glue and Steri-Strips placed over a forehead laceration.  Keep these in place until they fall off on their own.

## 2019-05-23 NOTE — ED Notes (Signed)
PTAR called  

## 2019-05-24 NOTE — ED Notes (Signed)
PT. DOCUMENTED IN ERROR SEE ABOVE NOTE IN CHART.

## 2019-05-28 ENCOUNTER — Emergency Department (HOSPITAL_COMMUNITY)
Admission: EM | Admit: 2019-05-28 | Discharge: 2019-05-28 | Disposition: A | Discharge status: Skilled Nursing Facility | Payer: BC Managed Care – PPO | Attending: Emergency Medicine | Admitting: Emergency Medicine

## 2019-05-28 DIAGNOSIS — W19XXXA Unspecified fall, initial encounter: Secondary | ICD-10-CM

## 2019-05-28 DIAGNOSIS — G309 Alzheimer's disease, unspecified: Secondary | ICD-10-CM | POA: Insufficient documentation

## 2019-05-28 DIAGNOSIS — Y9389 Activity, other specified: Secondary | ICD-10-CM | POA: Diagnosis not present

## 2019-05-28 DIAGNOSIS — Y92129 Unspecified place in nursing home as the place of occurrence of the external cause: Secondary | ICD-10-CM | POA: Diagnosis not present

## 2019-05-28 DIAGNOSIS — Y999 Unspecified external cause status: Secondary | ICD-10-CM | POA: Insufficient documentation

## 2019-05-28 DIAGNOSIS — S0181XA Laceration without foreign body of other part of head, initial encounter: Secondary | ICD-10-CM | POA: Insufficient documentation

## 2019-05-28 DIAGNOSIS — W050XXA Fall from non-moving wheelchair, initial encounter: Secondary | ICD-10-CM | POA: Insufficient documentation

## 2019-05-28 DIAGNOSIS — R569 Unspecified convulsions: Secondary | ICD-10-CM

## 2019-05-28 LAB — COMPREHENSIVE METABOLIC PANEL
ALT: <5 U/L (ref 0–44)
AST: 16 U/L (ref 15–41)
Albumin: 3.1 g/dL — ABNORMAL LOW (ref 3.5–5.0)
Alkaline Phosphatase: 74 U/L (ref 38–126)
Anion gap: 7 (ref 5–15)
BUN: 14 mg/dL (ref 8–23)
CO2: 27 mmol/L (ref 22–32)
Calcium: 8.7 mg/dL — ABNORMAL LOW (ref 8.9–10.3)
Chloride: 105 mmol/L (ref 98–111)
Creatinine, Ser: 0.66 mg/dL (ref 0.61–1.24)
GFR calc Af Amer: >60 mL/min (ref >60)
GFR calc non Af Amer: >60 mL/min (ref >60)
Glucose, Bld: 89 mg/dL (ref 70–99)
Potassium: 4 mmol/L (ref 3.5–5.1)
Sodium: 139 mmol/L (ref 135–145)
Total Bilirubin: 0.9 mg/dL (ref 0.3–1.2)
Total Protein: 6.3 g/dL — ABNORMAL LOW (ref 6.5–8.1)

## 2019-05-28 LAB — CBC WITH DIFFERENTIAL/PLATELET
Abs Immature Granulocytes: 0.02 10*3/uL (ref 0.00–0.07)
Basophils Absolute: 0 10*3/uL (ref 0.0–0.1)
Basophils Relative: 1 %
Eosinophils Absolute: 0 10*3/uL (ref 0.0–0.5)
Eosinophils Relative: 1 %
HCT: 36.9 % — ABNORMAL LOW (ref 39.0–52.0)
Hemoglobin: 12.4 g/dL — ABNORMAL LOW (ref 13.0–17.0)
Immature Granulocytes: 0 %
Lymphocytes Relative: 20 %
Lymphs Abs: 0.9 10*3/uL (ref 0.7–4.0)
MCH: 33.8 pg (ref 26.0–34.0)
MCHC: 33.6 g/dL (ref 30.0–36.0)
MCV: 100.5 fL — ABNORMAL HIGH (ref 80.0–100.0)
Monocytes Absolute: 0.3 10*3/uL (ref 0.1–1.0)
Monocytes Relative: 6 %
Neutro Abs: 3.2 10*3/uL (ref 1.7–7.7)
Neutrophils Relative %: 72 %
Platelets: ADEQUATE 10*3/uL (ref 150–400)
RBC: 3.67 MIL/uL — ABNORMAL LOW (ref 4.22–5.81)
RDW: 15.8 % — ABNORMAL HIGH (ref 11.5–15.5)
WBC: 4.5 10*3/uL (ref 4.0–10.5)
nRBC: 0 % (ref 0.0–0.2)

## 2019-05-28 NOTE — ED Triage Notes (Signed)
Arrives via EMS from [facility], had a witnessed seizure by staff, fell from chair to floor and has a laceration to his L eyebrow.

## 2019-05-28 NOTE — ED Provider Notes (Signed)
[facility]-EMERGENCY DEPT Provider Note   CSN: [id number] Arrival date & time: 05/28/19  1130     History No chief complaint on file.   Kenneth Mcdaniel is a 64 y.o. male.  HPI    Patient presents after a witnessed seizure and fall.  Patient has dementia, seizure disorder, frequent falls. Level 5 caveat secondary to history of dementia. Per EMS report the patient was at his nursing facility, in his wheelchair when he had a witnessed seizure.  He seemingly fell from the wheelchair to the ground. No reported loss of consciousness, and after a postictal time he has now been interactive in a reportedly typical manner for him. Patient self cannot provide any additional details of the history. EMS reports that the patient was hemodynamically unremarkable in route, and as above became increasingly interactive and reportedly typical manner. Past Medical History:  Diagnosis Date  . Agitation   . Early onset Alzheimer's dementia ([facility])    from [facility]  . Hyperlipidemia   . PVD (peripheral vascular disease) [facility])     Patient Active Problem List   Diagnosis Date Noted  . Sepsis secondary to UTI ([facility]) 05/01/2019  . Acute encephalopathy 05/01/2019  . MSSA bacteremia 05/01/2019  . UTI (urinary tract infection) 11/20/2018  . Seizure ([facility]) 11/20/2018  . Sepsis ([facility]) 11/20/2018  . Dementia ([facility]) 03/21/2017    No past surgical history on file.     Family History  Problem Relation Age of Onset  . Stroke Mother   . Diabetes Neg Hx   . Hypertension Neg Hx     Social History   Tobacco Use  . Smoking status: Never Smoker  . Smokeless tobacco: Never Used  Substance Use Topics  . Alcohol use: Never  . Drug use: No    Home Medications Prior to Admission medications   Medication Sig Start Date End Date Taking? Authorizing Provider  acetaminophen (TYLENOL) 500 MG tablet Take 1 tablet (500 mg total) by mouth every 4 (four) hours. Please only give while  awake. Patient not taking: Reported on 05/19/2019 05/05/19   [name], MD  ALPRAZolam Prudy Feeler) 0.5 MG tablet Take 1 tablet (0.5 mg total) by mouth 2 (two) times daily. 11/23/18   Meredeth Ide, MD  ALPRAZolam Prudy Feeler) 1 MG tablet Take 1 tablet (1 mg total) by mouth at bedtime. 11/23/18   Meredeth Ide, MD  Cholecalciferol (VITAMIN D-3) 125 MCG (5000 UT) TABS Take 5,000 Units by mouth daily. Patient not taking: Reported on 05/19/2019 05/05/19   [name], MD  divalproex (DEPAKOTE SPRINKLE) 125 MG capsule Take 4 capsules (500 mg total) by mouth 2 (two) times daily. 12/21/18   Margarita Grizzle, MD  feeding supplement, GLUCERNA SHAKE, (GLUCERNA SHAKE) LIQD Take 237 mLs by mouth See admin instructions. Drink 1 shake (237 ml's) by mouth three times a day and with any snacks (CHOCOLATE)     [provider]  mirtazapine (REMERON) 15 MG tablet Take 15 mg by mouth at bedtime.    [provider]  traZODone (DESYREL) 100 MG tablet Take 100 mg by mouth at bedtime.  08/16/18   [provider]  zinc gluconate 50 MG tablet Take 2 tablets (100 mg total) by mouth daily. Patient not taking: Reported on 05/19/2019 05/05/19   [name], MD    Allergies    Patient has no known allergies.  Review of Systems   Review of Systems  Unable to perform ROS: Dementia    Physical  Exam Updated Vital Signs BP (!) 101/57   Pulse 64   Temp 97.6 F (36.4 C) (Oral)   Resp 11   Ht 5\' 9"  (1.753 m)   Wt 48 kg   SpO2 99%   BMI 15.63 kg/m   Physical Exam Vitals and nursing note reviewed.  Constitutional:      Appearance: He is well-developed.     Comments: Deconditioned adult male moving all extremities spontaneously, but not interacting in a meaningful manner during the exam  HENT:     Head: Normocephalic.     Comments: Evidence for multiple prior falls, lacerations. In the left superior brow there is a 4 cm laceration diagonally oriented with medial edge inferior. Eyes:      Conjunctiva/sclera: Conjunctivae normal.  Neck:     Comments: Cervical collar in place Cardiovascular:     Rate and Rhythm: Normal rate and regular rhythm.  Pulmonary:     Effort: Pulmonary effort is normal. No respiratory distress.     Breath sounds: No stridor.  Abdominal:     General: There is no distension.  Skin:    General: Skin is warm and dry.  Neurological:     Motor: Atrophy present.     Comments: Does not follow commands reliably, does answer some questions briefly, though not consistently with appropriate answers.Moves all extremities spontaneously though not to commands.  Psychiatric:        Cognition and Memory: Cognition is impaired. Memory is impaired.     ED Results / Procedures / Treatments   Labs (all labs ordered are listed, but only abnormal results are displayed) Labs Reviewed  COMPREHENSIVE METABOLIC PANEL - Abnormal; Notable for the following components:      Result Value   Calcium 8.7 (*)    Total Protein 6.3 (*)    Albumin 3.1 (*)    All other components within normal limits  CBC WITH DIFFERENTIAL/PLATELET - Abnormal; Notable for the following components:   RBC 3.67 (*)    Hemoglobin 12.4 (*)    HCT 36.9 (*)    MCV 100.5 (*)    RDW 15.8 (*)    All other components within normal limits    EKG EKG Interpretation  Date/Time:  Wednesday May 28 2019 11:48:52 EST Ventricular Rate:  72 PR Interval:    QRS Duration: 99 QT Interval:  362 QTC Calculation: 397 R Axis:   85 Text Interpretation: Sinus rhythm Borderline right axis deviation Baseline wander in lead(s) V3 Artifact Abnormal ECG Confirmed by [id number] [phone]) on 05/28/2019 11:54:53 AM   Procedures Procedures (including critical care time)  LACERATION REPAIR Performed by: 07/26/2019 Authorized by: Gerhard Munch Consent: Verbal consent obtained. Risks and benefits: risks, benefits and alternatives were discussed Consent given by: patient Patient identity confirmed:  provided demographic data Prepped and Draped in normal sterile fashion Wound explored  Laceration Location: L forehead / face  Laceration Length: 4cm  No Foreign Bodies seen or palpated   Irrigation method: syringe Amount of cleaning: standard  Skin closure: 1 tube of dermabond  Number of tubes: 1  Technique: close approximation  Patient tolerance: Patient tolerated the procedure well with no immediate complications.   Medications Ordered in ED Medications - No data to display  ED Course  I have reviewed the triage vital signs and the nursing notes.  Pertinent labs & imaging results that were available during my care of the patient were reviewed by me and considered in my medical decision making (see chart  for details).     Chart review notable for 15 prior ED visits in 6 months, multiple prior imaging studies head, neck.  Today he is moving all extremity spontaneously, has seemingly resolved from his postictal phase, and no current indication for additional advanced imaging of his head, neck.  MDM Rules/Calculators/A&P                      1:20 PM 1:20 PM Sleeping, awakens easily, moves all extremities.  On chart review is clear of but this is consistent with the patient's typical behavior.  Labs unremarkable.  This adult male presents after witnessed seizure, minor fall.  He is awake, alert, though initially postictal, by the time of ED arrival the patient was interacting in a reportedly typical manner.  Patient has known seizure disorder, dementia, and after several hours of monitoring in the emergency department with no decompensation, no hemodynamic instability, no substantial lab abnormalities, and after successful repair of his head laceration patient was discharged in stable condition back to his nursing facility.   Seizure Fall Facial laceration, initial encounter   Carmin Muskrat, MD 05/28/19 1321

## 2019-05-28 NOTE — ED Notes (Signed)
[facility] called for transport back to [facility]. [facility] and notified them of patient's return.

## 2019-05-28 NOTE — Discharge Instructions (Addendum)
As discussed, your evaluation today has been largely reassuring.  But, it is important that you monitor your condition carefully, and do not hesitate to return to the ED if you develop new, or concerning changes in your condition.  Otherwise, please follow-up with your physician for appropriate ongoing care.  The tissue adhesive used to close your wound on your forehead will fall off when the time is appropriate.  Please do not prematurely irritate the area or cause it to be removed.

## 2019-07-28 ENCOUNTER — Emergency Department (HOSPITAL_COMMUNITY): Payer: BC Managed Care – PPO

## 2019-07-28 ENCOUNTER — Other Ambulatory Visit: Payer: Self-pay

## 2019-07-28 ENCOUNTER — Emergency Department (HOSPITAL_COMMUNITY)
Admission: EM | Admit: 2019-07-28 | Discharge: 2019-07-28 | Disposition: A | Discharge status: Home / Self Care | Payer: BC Managed Care – PPO | Attending: Emergency Medicine | Admitting: Emergency Medicine

## 2019-07-28 ENCOUNTER — Encounter (HOSPITAL_COMMUNITY): Payer: Self-pay | Admitting: Emergency Medicine

## 2019-07-28 DIAGNOSIS — Z79899 Other long term (current) drug therapy: Secondary | ICD-10-CM | POA: Insufficient documentation

## 2019-07-28 DIAGNOSIS — I739 Peripheral vascular disease, unspecified: Secondary | ICD-10-CM | POA: Diagnosis not present

## 2019-07-28 DIAGNOSIS — F0390 Unspecified dementia without behavioral disturbance: Secondary | ICD-10-CM | POA: Diagnosis not present

## 2019-07-28 DIAGNOSIS — R569 Unspecified convulsions: Secondary | ICD-10-CM

## 2019-07-28 LAB — CBC WITH DIFFERENTIAL/PLATELET
Abs Immature Granulocytes: 0.01 10*3/uL (ref 0.00–0.07)
Basophils Absolute: 0 10*3/uL (ref 0.0–0.1)
Basophils Relative: 1 %
Eosinophils Absolute: 0.1 10*3/uL (ref 0.0–0.5)
Eosinophils Relative: 2 %
HCT: 40.8 % (ref 39.0–52.0)
Hemoglobin: 13.8 g/dL (ref 13.0–17.0)
Immature Granulocytes: 0 %
Lymphocytes Relative: 35 %
Lymphs Abs: 1.2 10*3/uL (ref 0.7–4.0)
MCH: 32.5 pg (ref 26.0–34.0)
MCHC: 33.8 g/dL (ref 30.0–36.0)
MCV: 96 fL (ref 80.0–100.0)
Monocytes Absolute: 0.3 10*3/uL (ref 0.1–1.0)
Monocytes Relative: 9 %
Neutro Abs: 1.7 10*3/uL (ref 1.7–7.7)
Neutrophils Relative %: 53 %
Platelets: 237 10*3/uL (ref 150–400)
RBC: 4.25 MIL/uL (ref 4.22–5.81)
RDW: 12.2 % (ref 11.5–15.5)
WBC: 3.3 10*3/uL — ABNORMAL LOW (ref 4.0–10.5)
nRBC: 0 % (ref 0.0–0.2)

## 2019-07-28 LAB — BASIC METABOLIC PANEL
Anion gap: 11 (ref 5–15)
BUN: 14 mg/dL (ref 8–23)
CO2: 27 mmol/L (ref 22–32)
Calcium: 9.1 mg/dL (ref 8.9–10.3)
Chloride: 101 mmol/L (ref 98–111)
Creatinine, Ser: 0.76 mg/dL (ref 0.61–1.24)
GFR calc Af Amer: >60 mL/min (ref >60)
GFR calc non Af Amer: >60 mL/min (ref >60)
Glucose, Bld: 99 mg/dL (ref 70–99)
Potassium: 3.9 mmol/L (ref 3.5–5.1)
Sodium: 139 mmol/L (ref 135–145)

## 2019-07-28 LAB — CBG MONITORING, ED: Glucose-Capillary: 94 mg/dL (ref 70–99)

## 2019-07-28 LAB — MAGNESIUM: Magnesium: 1.8 mg/dL (ref 1.7–2.4)

## 2019-07-28 MED ORDER — DIVALPROEX SODIUM 125 MG PO CSDR: 250.0000 mg | DELAYED_RELEASE_CAPSULE | Freq: Two times a day (BID) | ORAL | 0 refills | Status: AC

## 2019-07-28 NOTE — ED Provider Notes (Signed)
[facility] EMERGENCY DEPARTMENT Provider Note   CSN: [id number] Arrival date & time: 07/28/19  0920     History Chief Complaint  Patient presents with  . Seizures    Kenneth Mcdaniel is a 64 y.o. male.    HPI  64 year old male with history of dementia and 1 prior seizure presents to the ER after staff reported that he had a seizure lasted 2 to 3 minutes at his residence at [facility].  Patient has dementia at baseline, is not able to answer any of my questions, is not alert and oriented, neuro exam difficult to perform given patient does not follow commands.  Spoke with caregiver at [facility], she states that this morning he was sitting up and eating breakfast in his wheelchair, whenshe stepped away for a few minutes.  The tech called her over and stated that he was having a seizure, states he was having upper body myoclonus, turning red in the face, having difficulty breathing.  Nursing staff states that they took him out of the wheelchair, laid him on the ground and on his side until the seizure passed.  She reports that the seizure lasted approximately 2 to 3 minutes, and the patient did not appear to have a postictal state and was at baseline by the time EMS arrived.  Nurse reports that the patient is only oriented to self, though on my exam the patient was not able to tell me what his name was.  She states that he has had 1 prior seizure in the past, but is not on any medications for it at this time.  She states that he has had no other complaints or symptoms at including fever, cough, abdominal pain, urinary symptoms, chest pain.    LEVEL 5/5 CAVEAT 2/2 DEMENTIA   Past Medical History:  Diagnosis Date  . Agitation   . Early onset Alzheimer's dementia (HCC)    from [facility]  . Hyperlipidemia   . PVD (peripheral vascular disease) [facility])     Patient Active Problem List   Diagnosis Date Noted  . Sepsis secondary to UTI (HCC) 05/01/2019  . Acute  encephalopathy 05/01/2019  . MSSA bacteremia 05/01/2019  . UTI (urinary tract infection) 11/20/2018  . Seizure (HCC) 11/20/2018  . Sepsis (HCC) 11/20/2018  . Dementia (HCC) 03/21/2017    No past surgical history on file.     Family History  Problem Relation Age of Onset  . Stroke Mother   . Diabetes Neg Hx   . Hypertension Neg Hx     Social History   Tobacco Use  . Smoking status: Never Smoker  . Smokeless tobacco: Never Used  Substance Use Topics  . Alcohol use: Never  . Drug use: No    Home Medications Prior to Admission medications   Medication Sig Start Date End Date Taking? Authorizing Provider  acetaminophen (TYLENOL) 500 MG tablet Take 1 tablet (500 mg total) by mouth every 4 (four) hours. Please only give while awake. Patient not taking: Reported on 05/19/2019 05/05/19   [name], MD  ALPRAZolam Prudy Feeler) 0.5 MG tablet Take 1 tablet (0.5 mg total) by mouth 2 (two) times daily. 11/23/18   Meredeth Ide, MD  ALPRAZolam Prudy Feeler) 1 MG tablet Take 1 tablet (1 mg total) by mouth at bedtime. 11/23/18   Meredeth Ide, MD  Cholecalciferol (VITAMIN D-3) 125 MCG (5000 UT) TABS Take 5,000 Units by mouth daily. Patient not taking: Reported on 05/19/2019 05/05/19   Lyn Hollingshead,  Greig Castilla, MD  divalproex (DEPAKOTE SPRINKLE) 125 MG capsule Take 2 capsules (250 mg total) by mouth 2 (two) times daily. 07/28/19   Mare Ferrari, PA-C  feeding supplement, GLUCERNA SHAKE, (GLUCERNA SHAKE) LIQD Take 237 mLs by mouth See admin instructions. Drink 1 shake (237 ml's) by mouth three times a day and with any snacks (CHOCOLATE)     [provider]  mirtazapine (REMERON) 15 MG tablet Take 15 mg by mouth at bedtime.    [provider]  traZODone (DESYREL) 100 MG tablet Take 100 mg by mouth at bedtime.  08/16/18   [provider]  zinc gluconate 50 MG tablet Take 2 tablets (100 mg total) by mouth daily. Patient not taking: Reported on 05/19/2019 05/05/19   [name], MD     Allergies    Patient has no known allergies.  Review of Systems   Review of Systems  Unable to perform ROS: Dementia    Physical Exam Updated Vital Signs BP 109/87 (BP Location: Left Arm)   Pulse 61   Temp 98.2 F (36.8 C) (Oral)   Resp 19   SpO2 98%   Physical Exam Vitals and nursing note reviewed.  Constitutional:      General: He is not in acute distress.    Appearance: He is not ill-appearing.  HENT:     Head: Normocephalic and atraumatic.     Mouth/Throat:     Comments: Not able to assess oral cavity/tongue, as patient does not follow directions Eyes:     Extraocular Movements: Extraocular movements intact.     Pupils: Pupils are equal, round, and reactive to light.  Cardiovascular:     Rate and Rhythm: Normal rate and regular rhythm.  Pulmonary:     Effort: Pulmonary effort is normal.     Breath sounds: Normal breath sounds.  Abdominal:     General: Abdomen is flat.     Palpations: Abdomen is soft.  Musculoskeletal:        General: Normal range of motion.     Cervical back: Normal range of motion.  Skin:    General: Skin is warm and dry.  Neurological:     Mental Status: He is alert.     Comments: Unable to perform due to patient's baseline dementia.  Alert, moving upper and lower extremities but does not follow commands  Psychiatric:     Comments: Baseline dementia.  A&O x0.  Not able to answer any questions.  Alert, looking around, mumbling vague words.  Does not follow commands     ED Results / Procedures / Treatments   Labs (all labs ordered are listed, but only abnormal results are displayed) Labs Reviewed  CBC WITH DIFFERENTIAL/PLATELET - Abnormal; Notable for the following components:      Result Value   WBC 3.3 (*)    All other components within normal limits  BASIC METABOLIC PANEL  MAGNESIUM  URINALYSIS, ROUTINE W REFLEX MICROSCOPIC  CBG MONITORING, ED    EKG None  Radiology DG Chest 1 View  Result Date: 07/28/2019 CLINICAL  DATA:  New onset seizure. EXAM: CHEST  1 VIEW COMPARISON:  05/07/2019 FINDINGS: Lungs are adequately inflated and otherwise clear. Cardiomediastinal silhouette and remainder of the exam is unchanged. IMPRESSION: No acute findings. Electronically Signed   By: Elberta Fortis M.D.   On: 07/28/2019 10:33   CT Head Wo Contrast  Result Date: 07/28/2019 CLINICAL DATA:  Seizure, acute, history of trauma. EXAM: CT HEAD WITHOUT CONTRAST TECHNIQUE: Contiguous axial  images were obtained from the base of the skull through the vertex without intravenous contrast. COMPARISON:  05/19/2019 and 05/18/2019 head CT. FINDINGS: Brain: Moderate diffuse cerebral atrophy. No acute infarct or intracranial hemorrhage. No mass lesion. No midline shift or extra-axial fluid collection. Confluent hypodense foci involving the periventricular white matter are unchanged. Vascular: No hyperdense vessel or unexpected calcification. Skull: No acute fracture or focal osseous lesion. Chronic posttraumatic appearance of the bilateral nasal bones. Sinuses/Orbits: Normal left orbit. Chronic posttraumatic deformity of the right orbital floor status post fixation. Minimal left frontal, ethmoid and maxillary sinus mucosal thickening, unchanged. No mastoid effusion. Other: Unchanged partial opacification of the bilateral external auditory canals likely reflects cerumen. IMPRESSION: No acute intracranial process. Moderate cerebral atrophy, unchanged. Unchanged dilatation of the ventricular system out of proportion to cerebral volume loss is suspicious for normal pressure hydrocephalus. Electronically Signed   By: Primitivo Gauze M.D.   On: 07/28/2019 13:17    Procedures Procedures (including critical care time)  Medications Ordered in ED Medications - No data to display  ED Course  I have reviewed the triage vital signs and the nursing notes.  Pertinent labs & imaging results that were available during my care of the patient were reviewed by  me and considered in my medical decision making (see chart for details).    MDM Rules/Calculators/A&P                     64 year old with dementia presents the ER for reported seizure activity per nursing home staff. On presentation to the ER, patient is alert moving upper and lower extremities, but unable to answer any of my questions or follow commands.  A&Ox0.  Patient's caregiver states that he is alert to self.  Caregiver states that he has had a previous history of seizures, upon further chart review it appears that has had multiple seizures, with the most recent one appears to be in February 2021. Nursing home medications shows that he is taking 350 mg of Depakote per day.  Unclear if he is receiving these meds as nursing home staff states that he is not taking anything for seizures. Physical exam difficult to perform as the patient has baseline dementia and does not respond to commands.  He is alert, moving all extremities purposelessly and mumbling vague words, not responding to commands.  No noticeable focal neuro deficits.  Will order CBC, BMP, chest x-ray, mag to rule out infectious/electrolyte causes, CT of head to rule out bleed.  Suspect that these will be negative given patient's prior seizure history; possible poor compliance or need for reassessment of home seizure medication regimen.  Will consult neurology.  CBC with mild leukopenia, chest x-ray without any acute cardiopulmonary causes.  BMP without significant electrode abnormalities, normal magnesium level.  Patient not hypoglycemic.  Chest x-ray without acute cardiopulmonary pathology, CT head negative for acute pathology, consistent with normal pressure hydrocephalus, unlikely this is the cause of his seizure.  I spoke with nursing staff again at [facility] place again in order to establish his exact medication regimen.  Nursing staff relates that he is not taking Depakote sprinkle as it was DC'd by hospice for an unknown reason.   Attempted to contact patient's wife to see if she could provide an additional formation without success.  He is only taking Remeron, trazodone, Glucerna, and Xanax per nursing home staff.  Given patient has had multiple visits to the ER for seizures/seizure-like activity and is not taking any seizure medications,  I suspect this seizure episode is likely due to medication noncompliance rather than a infectious/electrolyte/intracranial cause.  I will restart his Depakote 250 mg twice daily, dosing per previous notes.  At this stage in ED course, neurology consult not necessary since he has a known history of seizures and is not taking his medications.  Patient has had no repeat seizures in the ER and has remained at his baseline mental status.  Stressed in discharge paperwork to nursing home staff the importance of compliance with this medication to improve his quality of life and decrease the ER visits.  Recommended follow-up with PCP for further management.  At this stage in the ED course, patient stable for discharge.  Patient seen and evaluated Dr. Jeraldine Loots and he agrees with the above plan.   Final Clinical Impression(s) / ED Diagnoses Final diagnoses:  Seizure [facility])    Rx / DC Orders ED Discharge Orders         Ordered    divalproex (DEPAKOTE SPRINKLE) 125 MG capsule  2 times daily     07/28/19 1350           Leone Brand 07/28/19 1405    Gerhard Munch, MD 07/28/19 1510

## 2019-07-28 NOTE — ED Notes (Signed)
Pt d/c instructions reviewed with family. Verbalized understanding. Pt discharged.

## 2019-07-28 NOTE — ED Triage Notes (Signed)
Pt BIB [facility] from [facility]. Per facility staff pt was sitting in wheelchair and experienced a seizure lasting approx. 30 sec. Pt the returned to baseline. VSS. NAD.

## 2019-07-28 NOTE — Discharge Instructions (Addendum)
Your lab work did not show any electrolyte abnormalities, signs of infection and your CT scan did not show any bleeds which could cause a seizure.  Your repeated seizures likely due to not taking your seizure medication.  Please start back prescribed Depakote 250mg  twice a day for prevention of further seizures.  You have had multiple visits to the ER for seizures, so it is important to continue taking medication to prevent this.  Please follow-up with your primary care provider for further management of seizure medications.

## 2019-09-24 ENCOUNTER — Other Ambulatory Visit: Payer: Self-pay

## 2019-09-24 ENCOUNTER — Encounter (HOSPITAL_COMMUNITY): Payer: Self-pay | Admitting: Emergency Medicine

## 2019-09-24 ENCOUNTER — Emergency Department (HOSPITAL_COMMUNITY)
Admission: EM | Admit: 2019-09-24 | Discharge: 2019-09-24 | Disposition: A | Discharge status: Home / Self Care | Payer: BC Managed Care – PPO | Attending: Emergency Medicine | Admitting: Emergency Medicine

## 2019-09-24 ENCOUNTER — Emergency Department (HOSPITAL_COMMUNITY): Payer: BC Managed Care – PPO

## 2019-09-24 DIAGNOSIS — G40309 Generalized idiopathic epilepsy and epileptic syndromes, not intractable, without status epilepticus: Secondary | ICD-10-CM | POA: Diagnosis not present

## 2019-09-24 DIAGNOSIS — G309 Alzheimer's disease, unspecified: Secondary | ICD-10-CM | POA: Insufficient documentation

## 2019-09-24 DIAGNOSIS — R569 Unspecified convulsions: Secondary | ICD-10-CM

## 2019-09-24 HISTORY — DX: Unspecified convulsions: R56.9

## 2019-09-24 LAB — COMPREHENSIVE METABOLIC PANEL
ALT: 16 U/L (ref 0–44)
AST: 18 U/L (ref 15–41)
Albumin: 3.5 g/dL (ref 3.5–5.0)
Alkaline Phosphatase: 48 U/L (ref 38–126)
Anion gap: 9 (ref 5–15)
BUN: 21 mg/dL (ref 8–23)
CO2: 29 mmol/L (ref 22–32)
Calcium: 9.2 mg/dL (ref 8.9–10.3)
Chloride: 104 mmol/L (ref 98–111)
Creatinine, Ser: 0.74 mg/dL (ref 0.61–1.24)
GFR calc Af Amer: >60 mL/min (ref >60)
GFR calc non Af Amer: >60 mL/min (ref >60)
Glucose, Bld: 89 mg/dL (ref 70–99)
Potassium: 4.1 mmol/L (ref 3.5–5.1)
Sodium: 142 mmol/L (ref 135–145)
Total Bilirubin: 1.2 mg/dL (ref 0.3–1.2)
Total Protein: 6.2 g/dL — ABNORMAL LOW (ref 6.5–8.1)

## 2019-09-24 LAB — CBC WITH DIFFERENTIAL/PLATELET
Abs Immature Granulocytes: 0.01 10*3/uL (ref 0.00–0.07)
Basophils Absolute: 0 10*3/uL (ref 0.0–0.1)
Basophils Relative: 1 %
Eosinophils Absolute: 0.1 10*3/uL (ref 0.0–0.5)
Eosinophils Relative: 3 %
HCT: 40.9 % (ref 39.0–52.0)
Hemoglobin: 13.9 g/dL (ref 13.0–17.0)
Immature Granulocytes: 0 %
Lymphocytes Relative: 32 %
Lymphs Abs: 1.6 10*3/uL (ref 0.7–4.0)
MCH: 32.1 pg (ref 26.0–34.0)
MCHC: 34 g/dL (ref 30.0–36.0)
MCV: 94.5 fL (ref 80.0–100.0)
Monocytes Absolute: 0.5 10*3/uL (ref 0.1–1.0)
Monocytes Relative: 9 %
Neutro Abs: 2.9 10*3/uL (ref 1.7–7.7)
Neutrophils Relative %: 55 %
Platelets: 184 10*3/uL (ref 150–400)
RBC: 4.33 MIL/uL (ref 4.22–5.81)
RDW: 13.4 % (ref 11.5–15.5)
WBC: 5.1 10*3/uL (ref 4.0–10.5)
nRBC: 0 % (ref 0.0–0.2)

## 2019-09-24 LAB — VALPROIC ACID LEVEL: Valproic Acid Lvl: 22 ug/mL — ABNORMAL LOW (ref 50.0–100.0)

## 2019-09-24 MED ORDER — VALPROATE SODIUM 500 MG/5ML IV SOLN: 1000.0000 mg | Freq: Once | INTRAVENOUS | Status: DC

## 2019-09-24 MED ORDER — VALPROATE SODIUM 500 MG/5ML IV SOLN
1000.0000 mg | Freq: Once | INTRAVENOUS | Status: AC
  Administered 2019-09-24: 1000 mg via INTRAVENOUS
  Filled 2019-09-24: qty 10

## 2019-09-24 MED ORDER — SODIUM CHLORIDE 0.9 % IV BOLUS
500.0000 mL | Freq: Once | INTRAVENOUS | Status: AC
  Administered 2019-09-24: 500 mL via INTRAVENOUS

## 2019-09-24 NOTE — Discharge Instructions (Addendum)
Increase your Depakote so you are taking 750 mg twice a day.  Follow-up with your doctor

## 2019-09-24 NOTE — ED Notes (Signed)
[facility] notified regarding patient transport. 

## 2019-09-24 NOTE — ED Triage Notes (Signed)
Patient presents with possible seizure. Patient has a history of seizures. Seizure was witnessed by a caretaker who was no longer on scene when EMS arrived. Per staff on scene, patient was seen "shaking." Patient is at his baseline, no complaints. Patient is intermittently oriented to self at baseline.

## 2019-09-24 NOTE — ED Provider Notes (Signed)
[facility]-EMERGENCY DEPT Provider Note   CSN: [id number] Arrival date & time: 09/24/19  0631     History Chief Complaint  Patient presents with  . Seizures    Kenneth Mcdaniel is a 64 y.o. male.      Patient has a history of seizures and had a seizure at the home today  The history is provided by the patient. No language interpreter was used.  Seizures Seizure activity on arrival: yes   Seizure type:  Grand mal Preceding symptoms: no sensation of an aura present   Initial focality:  None Episode characteristics: abnormal movements   Postictal symptoms: confusion   Return to baseline: no   Severity:  Mild Timing:  Once Context: alcohol withdrawal        Past Medical History:  Diagnosis Date  . Agitation   . Early onset Alzheimer's dementia (HCC)    from [facility]  . Hyperlipidemia   . PVD (peripheral vascular disease) (HCC)   . Seizures [facility])     Patient Active Problem List   Diagnosis Date Noted  . Sepsis secondary to UTI (HCC) 05/01/2019  . Acute encephalopathy 05/01/2019  . MSSA bacteremia 05/01/2019  . UTI (urinary tract infection) 11/20/2018  . Seizure (HCC) 11/20/2018  . Sepsis (HCC) 11/20/2018  . Dementia (HCC) 03/21/2017    History reviewed. No pertinent surgical history.     Family History  Problem Relation Age of Onset  . Stroke Mother   . Diabetes Neg Hx   . Hypertension Neg Hx     Social History   Tobacco Use  . Smoking status: Never Smoker  . Smokeless tobacco: Never Used  Substance Use Topics  . Alcohol use: Never  . Drug use: No    Home Medications Prior to Admission medications   Medication Sig Start Date End Date Taking? Authorizing Provider  ALPRAZolam Prudy Feeler) 0.5 MG tablet Take 1 tablet (0.5 mg total) by mouth 2 (two) times daily. 11/23/18  Yes Sharl Ma, Sarina Ill, MD  ALPRAZolam Prudy Feeler) 1 MG tablet Take 1 tablet (1 mg total) by mouth at bedtime. 11/23/18  Yes Meredeth Ide, MD  divalproex (DEPAKOTE  SPRINKLE) 125 MG capsule Take 2 capsules (250 mg total) by mouth 2 (two) times daily. 07/28/19  Yes Trudee Grip A, PA-C  feeding supplement, GLUCERNA SHAKE, (GLUCERNA SHAKE) LIQD Take 237 mLs by mouth See admin instructions. Drink 1 shake (237 ml's) by mouth three times a day and with any snacks (CHOCOLATE)    Yes [provider]  traZODone (DESYREL) 100 MG tablet Take 100 mg by mouth at bedtime.  08/16/18  Yes [provider]  acetaminophen (TYLENOL) 500 MG tablet Take 1 tablet (500 mg total) by mouth every 4 (four) hours. Please only give while awake. Patient not taking: Reported on 05/19/2019 05/05/19   [name], MD  Cholecalciferol (VITAMIN D-3) 125 MCG (5000 UT) TABS Take 5,000 Units by mouth daily. Patient not taking: Reported on 05/19/2019 05/05/19   [name], MD  zinc gluconate 50 MG tablet Take 2 tablets (100 mg total) by mouth daily. Patient not taking: Reported on 05/19/2019 05/05/19   [name], MD    Allergies    Patient has no known allergies.  Review of Systems   Review of Systems  Constitutional: Negative for appetite change and fatigue.  HENT: Negative for congestion, ear discharge and sinus pressure.   Eyes: Negative for discharge.  Respiratory: Negative for cough.   Cardiovascular: Negative for chest  pain.  Gastrointestinal: Negative for abdominal pain and diarrhea.  Genitourinary: Negative for frequency and hematuria.  Musculoskeletal: Negative for back pain.  Skin: Negative for rash.  Neurological: Positive for seizures. Negative for headaches.  Psychiatric/Behavioral: Negative for hallucinations.    Physical Exam Updated Vital Signs BP 127/87   Pulse 63   Resp 17   SpO2 99%   Physical Exam Vitals and nursing note reviewed.  Constitutional:      Appearance: He is well-developed.  HENT:     Head: Normocephalic.     Mouth/Throat:     Mouth: Mucous membranes are moist.  Eyes:     General: No scleral icterus.     Conjunctiva/sclera: Conjunctivae normal.  Neck:     Thyroid: No thyromegaly.  Cardiovascular:     Rate and Rhythm: Normal rate and regular rhythm.     Heart sounds: No murmur. No friction rub. No gallop.   Pulmonary:     Breath sounds: No stridor. No wheezing or rales.  Chest:     Chest wall: No tenderness.  Abdominal:     General: There is no distension.     Tenderness: There is no abdominal tenderness. There is no rebound.  Musculoskeletal:        General: Normal range of motion.     Cervical back: Neck supple.  Lymphadenopathy:     Cervical: No cervical adenopathy.  Skin:    Findings: No erythema or rash.  Neurological:     Mental Status: He is alert.     Motor: No abnormal muscle tone.     Coordination: Coordination normal.     Comments: Oriented to person.  Patient is confused but he has Alzheimer's as this is normal  Psychiatric:        Behavior: Behavior normal.     ED Results / Procedures / Treatments   Labs (all labs ordered are listed, but only abnormal results are displayed) Labs Reviewed  COMPREHENSIVE METABOLIC PANEL - Abnormal; Notable for the following components:      Result Value   Total Protein 6.2 (*)    All other components within normal limits  VALPROIC ACID LEVEL - Abnormal; Notable for the following components:   Valproic Acid Lvl 22 (*)    All other components within normal limits  CBC WITH DIFFERENTIAL/PLATELET    EKG None  Radiology CT Head Wo Contrast  Result Date: 09/24/2019 CLINICAL DATA:  Cerebral hemorrhage suspected, witness seizure activity. EXAM: CT HEAD WITHOUT CONTRAST TECHNIQUE: Contiguous axial images were obtained from the base of the skull through the vertex without intravenous contrast. COMPARISON:  07/28/2019 FINDINGS: Brain: No evidence of acute infarction, hemorrhage, hydrocephalus, extra-axial collection or mass lesion/mass effect. Exam is motion limited. Stable atrophy and chronic microvascular ischemic change, ventricular  dilation remains stable. Vascular: No hyperdense vessel or unexpected calcification. Skull: Normal. Negative for fracture or focal lesion. Sinuses/Orbits: No air-fluid levels. Motion limited assessment. Signs of prior RIGHT facial fracture and ORIF. Finding similar to the prior study. Other: None IMPRESSION: 1. Motion limited assessment. 2. No acute intracranial abnormality. 3. Stable atrophy and chronic microvascular ischemic change. 4. Unchanged ventricular dilation. 5. Signs of prior RIGHT facial fracture and ORIF. Finding similar to the prior study. Electronically Signed   By: Zetta Bills M.D.   On: 09/24/2019 09:11    Procedures Procedures (including critical care time)  Medications Ordered in ED Medications  sodium chloride 0.9 % bolus 500 mL (0 mLs Intravenous Stopped 09/24/19 0819)  valproate (DEPACON)  1,000 mg in dextrose 5 % 50 mL IVPB (0 mg Intravenous Stopped 09/24/19 0925)    ED Course  I have reviewed the triage vital signs and the nursing notes.  Pertinent labs & imaging results that were available during my care of the patient were reviewed by me and considered in my medical decision making (see chart for details).    MDM Rules/Calculators/A&P                      Patient with seizures.  We will increase his Depakote to 750 mg twice a day and he will follow-up with his doctor       This patient presents to the ED for concern of seizure this involves an extensive number of treatment options, and is a complaint that carries with it a high risk of complications and morbidity.  The differential diagnosis includes seizure and brain tumor   Lab Tests:   I Ordered, reviewed, and interpreted labs, which included CBC chemistries and Depakote level patient had a low Depakote level  Medicines ordered:   I ordered medication Depakote for seizures  Imaging Studies ordered:   I ordered imaging studies which included CT scan of the head and  I independently visualized  and interpreted imaging which showed no acute disease  Additional history obtained:   Additional history obtained from old records Previous records obtained and reviewed  Consultations Obtained:   Reevaluation:  After the interventions stated above, I reevaluated the patient and found improved  Critical Interventions:  .   Final Clinical Impression(s) / ED Diagnoses Final diagnoses:  Seizure [facility])    Rx / DC Orders ED Discharge Orders    None       Bethann Berkshire, MD 09/24/19 [phone]

## 2019-10-22 ENCOUNTER — Other Ambulatory Visit: Payer: Self-pay

## 2019-10-22 ENCOUNTER — Encounter (HOSPITAL_COMMUNITY): Payer: Self-pay

## 2019-10-22 ENCOUNTER — Emergency Department (HOSPITAL_COMMUNITY)
Admission: EM | Admit: 2019-10-22 | Discharge: 2019-10-22 | Disposition: A | Discharge status: Hospice | Payer: BC Managed Care – PPO | Attending: Emergency Medicine | Admitting: Emergency Medicine

## 2019-10-22 DIAGNOSIS — Z79899 Other long term (current) drug therapy: Secondary | ICD-10-CM | POA: Diagnosis not present

## 2019-10-22 DIAGNOSIS — G300 Alzheimer's disease with early onset: Secondary | ICD-10-CM | POA: Diagnosis not present

## 2019-10-22 DIAGNOSIS — R569 Unspecified convulsions: Secondary | ICD-10-CM | POA: Insufficient documentation

## 2019-10-22 NOTE — ED Provider Notes (Signed)
[facility]-EMERGENCY DEPT Provider Note   CSN: [id number] Arrival date & time: 10/22/19  0940     History Chief Complaint  Patient presents with  . Seizures    Kenneth Mcdaniel is a 64 y.o. male.   Seizures Seizure activity on arrival: no   Seizure type: bilateral arm shaking, but alert and at baseline while the event happened. Initial focality:  None Episode characteristics: abnormal movements   Episode characteristics: no combativeness and no confusion   Return to baseline: yes   Severity:  Mild Timing:  Once Progression:  Resolved Context comment:  Hx of seizure Recent head injury:  No recent head injuries PTA treatment:  None History of seizures: yes   Compliance with current therapy:  Good      Past Medical History:  Diagnosis Date  . Agitation   . Early onset Alzheimer's dementia (HCC)    from [facility]  . Hyperlipidemia   . PVD (peripheral vascular disease) (HCC)   . Seizures [facility])     Patient Active Problem List   Diagnosis Date Noted  . Sepsis secondary to UTI (HCC) 05/01/2019  . Acute encephalopathy 05/01/2019  . MSSA bacteremia 05/01/2019  . UTI (urinary tract infection) 11/20/2018  . Seizure (HCC) 11/20/2018  . Sepsis (HCC) 11/20/2018  . Dementia (HCC) 03/21/2017    History reviewed. No pertinent surgical history.     Family History  Problem Relation Age of Onset  . Stroke Mother   . Diabetes Neg Hx   . Hypertension Neg Hx     Social History   Tobacco Use  . Smoking status: Never Smoker  . Smokeless tobacco: Never Used  Substance Use Topics  . Alcohol use: Never  . Drug use: No    Home Medications Prior to Admission medications   Medication Sig Start Date End Date Taking? Authorizing Provider  ALPRAZolam Prudy Feeler) 0.5 MG tablet Take 1 tablet (0.5 mg total) by mouth 2 (two) times daily. 11/23/18  Yes Sharl Ma, Sarina Ill, MD  ALPRAZolam Prudy Feeler) 1 MG tablet Take 1 tablet (1 mg total) by mouth at bedtime. 11/23/18   Yes Meredeth Ide, MD  divalproex (DEPAKOTE SPRINKLE) 125 MG capsule Take 2 capsules (250 mg total) by mouth 2 (two) times daily. Patient taking differently: Take 500 mg by mouth 2 (two) times daily.  07/28/19  Yes Trudee Grip A, PA-C  feeding supplement, GLUCERNA SHAKE, (GLUCERNA SHAKE) LIQD Take 237 mLs by mouth See admin instructions. Drink 1 shake (237 ml's) by mouth three times a day and with any snacks (CHOCOLATE)    Yes [provider]  mirtazapine (REMERON) 15 MG tablet Take 15 mg by mouth at bedtime.   Yes [provider]  traZODone (DESYREL) 100 MG tablet Take 100 mg by mouth at bedtime.  08/16/18  Yes [provider]  acetaminophen (TYLENOL) 500 MG tablet Take 1 tablet (500 mg total) by mouth every 4 (four) hours. Please only give while awake. Patient not taking: Reported on 05/19/2019 05/05/19   [name], MD  Cholecalciferol (VITAMIN D-3) 125 MCG (5000 UT) TABS Take 5,000 Units by mouth daily. Patient not taking: Reported on 05/19/2019 05/05/19   [name], MD  zinc gluconate 50 MG tablet Take 2 tablets (100 mg total) by mouth daily. Patient not taking: Reported on 05/19/2019 05/05/19   [name], MD    Allergies    Patient has no known allergies.  Review of Systems   Review of Systems  Unable to  perform ROS: Dementia  Constitutional: Negative for activity change, appetite change and fever.  HENT: Negative for congestion and rhinorrhea.   Musculoskeletal: Negative for joint swelling.  Skin: Positive for wound (chronic on foot). Negative for color change.  Neurological: Positive for seizures. Negative for syncope.  Psychiatric/Behavioral: Negative for behavioral problems.    Physical Exam Updated Vital Signs BP 104/66   Pulse 77   Temp 97.6 F (36.4 C) (Oral)   Resp 16   SpO2 100%   Physical Exam Vitals and nursing note reviewed.  Constitutional:      General: He is not in acute distress.    Appearance: Normal appearance.    HENT:     Head: Normocephalic and atraumatic.     Nose: No rhinorrhea.  Eyes:     General:        Right eye: No discharge.        Left eye: No discharge.     Conjunctiva/sclera: Conjunctivae normal.  Cardiovascular:     Rate and Rhythm: Normal rate and regular rhythm.  Pulmonary:     Effort: Pulmonary effort is normal.     Breath sounds: No stridor.  Abdominal:     General: Abdomen is flat. There is no distension.     Palpations: Abdomen is soft.  Musculoskeletal:        General: No deformity or signs of injury.       Legs:  Skin:    General: Skin is warm and dry.  Neurological:     General: No focal deficit present.     Mental Status: He is alert. Mental status is at baseline.     Motor: No weakness.     Comments: Moving all 4 extremities, not cooperative with sensation exam cranial nerve exam  Psychiatric:        Mood and Affect: Mood normal.        Behavior: Behavior normal.        Thought Content: Thought content normal.     ED Results / Procedures / Treatments   Labs (all labs ordered are listed, but only abnormal results are displayed) Labs Reviewed - No data to display  EKG None  Radiology No results found.  Procedures Procedures (including critical care time)  Medications Ordered in ED Medications - No data to display  ED Course  I have reviewed the triage vital signs and the nursing notes.  Pertinent labs & imaging results that were available during my care of the patient were reviewed by me and considered in my medical decision making (see chart for details).    MDM Rules/Calculators/A&P                          64 year old male with early onset dementia comes in with seizure-like activity, history of seizures as well.  Managed as an outpatient by hospice facility, they report no injury no recent illness.  There was report of bilateral arm shaking foaming at the mouth red face.  EMS took him here with normal vital signs and no longer having  seizure-like activity.  Most recently he was seen and had his medications increased, I spoke to his hospice providers and recommend they reevaluate his meds and make changes as they see fit.  He does not have a neurologist because he is hospice but they feel comfortable making medication changes.  He has multiple breakthrough seizures per record review and has had multiple CT scans of his head  in the last year none of which have shown any acute abnormalities.  With his seeming to be an acute exacerbation of a chronic problem we will do no further work-up or imaging at this point.  The hospice facility agrees with this plan and they feel safe managing him as an outpatient.  There are no signs of trauma and no story or signs of infection on exam either.  I feel he is safe for transport back to his hospice facility.    Final Clinical Impression(s) / ED Diagnoses Final diagnoses:  Seizure (HCC)  Seizure-like activity [facility])    Rx / DC Orders ED Discharge Orders    None       Sabino Donovan, MD 10/22/19 1009

## 2019-10-22 NOTE — Progress Notes (Signed)
[facility] Liaison Note  Kenneth Mcdaniel is a current hospice patient of ours at [facility]. Please do not hesitate to reach out if the hospital liaison team can be of assistance.  Chrislyn Brooke Dare, BSN, RN [employer] (listed on [place] under [facility])    [phone]

## 2019-10-22 NOTE — ED Triage Notes (Signed)
EMS reports from [facility], Seizure while sitting, shaking. Hx of. No LOC, no fall. Hospice Pt.  BP 162/98 HR 70 RR 16 Sp02 98 RA CBG 102

## 2019-10-25 ENCOUNTER — Other Ambulatory Visit: Payer: Self-pay

## 2019-10-25 ENCOUNTER — Emergency Department (HOSPITAL_COMMUNITY): Payer: BC Managed Care – PPO

## 2019-10-25 ENCOUNTER — Emergency Department (HOSPITAL_COMMUNITY)
Admission: EM | Admit: 2019-10-25 | Discharge: 2019-10-25 | Disposition: A | Discharge status: Skilled Nursing Facility | Payer: BC Managed Care – PPO | Attending: Emergency Medicine | Admitting: Emergency Medicine

## 2019-10-25 DIAGNOSIS — Y929 Unspecified place or not applicable: Secondary | ICD-10-CM | POA: Diagnosis not present

## 2019-10-25 DIAGNOSIS — S0101XA Laceration without foreign body of scalp, initial encounter: Secondary | ICD-10-CM | POA: Insufficient documentation

## 2019-10-25 DIAGNOSIS — Y999 Unspecified external cause status: Secondary | ICD-10-CM | POA: Diagnosis not present

## 2019-10-25 DIAGNOSIS — W1839XA Other fall on same level, initial encounter: Secondary | ICD-10-CM | POA: Diagnosis not present

## 2019-10-25 DIAGNOSIS — Y939 Activity, unspecified: Secondary | ICD-10-CM | POA: Diagnosis not present

## 2019-10-25 DIAGNOSIS — S0990XA Unspecified injury of head, initial encounter: Secondary | ICD-10-CM | POA: Diagnosis present

## 2019-10-25 DIAGNOSIS — F0390 Unspecified dementia without behavioral disturbance: Secondary | ICD-10-CM | POA: Insufficient documentation

## 2019-10-25 DIAGNOSIS — R5383 Other fatigue: Secondary | ICD-10-CM | POA: Diagnosis not present

## 2019-10-25 DIAGNOSIS — Z79899 Other long term (current) drug therapy: Secondary | ICD-10-CM | POA: Diagnosis not present

## 2019-10-25 NOTE — ED Provider Notes (Signed)
[facility]-EMERGENCY DEPT Provider Note   CSN: [id number] Arrival date & time: 10/25/19  1928     History Chief Complaint  Patient presents with   Kenneth Mcdaniel is a 64 y.o. male.  64 year old male with history of dementia who had a witnessed fall at his facility.  Was found on the floor with an abrasion to the left side of his head.  No loss of consciousness.  Is currently at his neurological baseline and was transported here for further evaluation.        Past Medical History:  Diagnosis Date   Agitation    Early onset Alzheimer's dementia (HCC)    from [facility]   Hyperlipidemia    PVD (peripheral vascular disease) (HCC)    Seizures (HCC)     Patient Active Problem List   Diagnosis Date Noted   Sepsis secondary to UTI (HCC) 05/01/2019   Acute encephalopathy 05/01/2019   MSSA bacteremia 05/01/2019   UTI (urinary tract infection) 11/20/2018   Seizure (HCC) 11/20/2018   Sepsis (HCC) 11/20/2018   Dementia (HCC) 03/21/2017    No past surgical history on file.     Family History  Problem Relation Age of Onset   Stroke Mother    Diabetes Neg Hx    Hypertension Neg Hx     Social History   Tobacco Use   Smoking status: Never Smoker   Smokeless tobacco: Never Used  Substance Use Topics   Alcohol use: Never   Drug use: No    Home Medications Prior to Admission medications   Medication Sig Start Date End Date Taking? Authorizing Provider  acetaminophen (TYLENOL) 500 MG tablet Take 1 tablet (500 mg total) by mouth every 4 (four) hours. Please only give while awake. Patient not taking: Reported on 05/19/2019 05/05/19   [name], MD  ALPRAZolam Prudy Feeler) 0.5 MG tablet Take 1 tablet (0.5 mg total) by mouth 2 (two) times daily. 11/23/18   Meredeth Ide, MD  ALPRAZolam Prudy Feeler) 1 MG tablet Take 1 tablet (1 mg total) by mouth at bedtime. 11/23/18   Meredeth Ide, MD  Cholecalciferol (VITAMIN D-3) 125 MCG  (5000 UT) TABS Take 5,000 Units by mouth daily. Patient not taking: Reported on 05/19/2019 05/05/19   [name], MD  divalproex (DEPAKOTE SPRINKLE) 125 MG capsule Take 2 capsules (250 mg total) by mouth 2 (two) times daily. Patient taking differently: Take 500 mg by mouth 2 (two) times daily.  07/28/19   Mare Ferrari, PA-C  feeding supplement, GLUCERNA SHAKE, (GLUCERNA SHAKE) LIQD Take 237 mLs by mouth See admin instructions. Drink 1 shake (237 ml's) by mouth three times a day and with any snacks (CHOCOLATE)     [provider]  mirtazapine (REMERON) 15 MG tablet Take 15 mg by mouth at bedtime.    [provider]  traZODone (DESYREL) 100 MG tablet Take 100 mg by mouth at bedtime.  08/16/18   [provider]  zinc gluconate 50 MG tablet Take 2 tablets (100 mg total) by mouth daily. Patient not taking: Reported on 05/19/2019 05/05/19   [name], MD    Allergies    Patient has no known allergies.  Review of Systems   Review of Systems  Unable to perform ROS: Dementia    Physical Exam Updated Vital Signs BP 114/89    Pulse 74    Temp 97.8 F (36.6 C)    Resp 18    SpO2 100%  Physical Exam Vitals and nursing note reviewed.  Constitutional:      General: He is not in acute distress.    Appearance: Normal appearance. He is well-developed. He is not toxic-appearing.  HENT:     Head:   Eyes:     General: Lids are normal.     Conjunctiva/sclera: Conjunctivae normal.     Pupils: Pupils are equal, round, and reactive to light.  Neck:     Thyroid: No thyroid mass.     Trachea: No tracheal deviation.  Cardiovascular:     Rate and Rhythm: Normal rate and regular rhythm.     Heart sounds: Normal heart sounds. No murmur heard.  No gallop.   Pulmonary:     Effort: Pulmonary effort is normal. No respiratory distress.     Breath sounds: Normal breath sounds. No stridor. No decreased breath sounds, wheezing, rhonchi or rales.  Abdominal:     General:  Bowel sounds are normal. There is no distension.     Palpations: Abdomen is soft.     Tenderness: There is no abdominal tenderness. There is no rebound.  Musculoskeletal:        General: No tenderness. Normal range of motion.     Cervical back: Normal range of motion and neck supple.  Skin:    General: Skin is warm and dry.     Findings: No abrasion or rash.  Neurological:     Mental Status: He is lethargic and disoriented.     GCS: GCS eye subscore is 2. GCS verbal subscore is 4. GCS motor subscore is 4.     Comments: Withdraws to pain in all extremities  Psychiatric:        Attention and Perception: He is inattentive.     ED Results / Procedures / Treatments   Labs (all labs ordered are listed, but only abnormal results are displayed) Labs Reviewed - No data to display  EKG None  Radiology No results found.  Procedures Procedures (including critical care time)  Medications Ordered in ED Medications - No data to display  ED Course  I have reviewed the triage vital signs and the nursing notes.  Pertinent labs & imaging results that were available during my care of the patient were reviewed by me and considered in my medical decision making (see chart for details).    MDM Rules/Calculators/A&P                          LACERATION REPAIR Performed by: Toy Baker Authorized by: Toy Baker Consent: Verbal consent obtained. Risks and benefits: risks, benefits and alternatives were discussed Consent given by: patient Patient identity confirmed: provided demographic data Prepped and Draped in normal sterile fashion Wound explored  Laceration Location: Scalp   Laceration Length: 2.5 ofcm  No Foreign Bodies seen or palpated  Anesthesia: local infiltration    Irrigation method: syringe Amount of cleaning: standard  Skin closure: Staples   Number of sutures: 5  Technique: Simple  Patient tolerance: Patient tolerated the procedure well with no  immediate complications. Final Clinical Impression(s) / ED Diagnoses Final diagnoses:  None   Patient's nurse called the nursing home and patient is at his baseline at this time. Head CT without acute findings along with his cervical spine. Will discharge back to facility Rx / DC Orders ED Discharge Orders    None       Lorre Nick, MD 10/25/19 2212

## 2019-10-25 NOTE — ED Triage Notes (Signed)
Pt had an unwitnessed fall at SNF, abrasion to L side of head, unknown LOC, no blood thinners, pt at baseline per facility, bleeding controlled

## 2019-10-25 NOTE — ED Notes (Signed)
[facility] called for transport.  

## 2019-10-25 NOTE — Discharge Instructions (Addendum)
Have staples removed in 7 to 10 days. °

## 2019-10-26 NOTE — ED Notes (Signed)
Attempted to call facility for report 

## 2019-10-26 NOTE — ED Notes (Signed)
Attempted to call facility for report

## 2019-11-16 DEATH — deceased

## 2021-04-03 IMAGING — CT CT HEAD W/O CM
3 series · 15 of 47 positions shown, 18 images · non-contrast
Comparison: March 03, 2019

CLINICAL DATA: Status post trauma.

EXAM:
CT HEAD WITHOUT CONTRAST
TECHNIQUE: Contiguous axial images were obtained from the base of the skull
through the vertex without intravenous contrast.

[Series 3: head wo · axial · 0.47mm/px · z∈[+1188,+1313]mm · 9 of 30 slices shown, 12 images]
[im 3/30  brain]
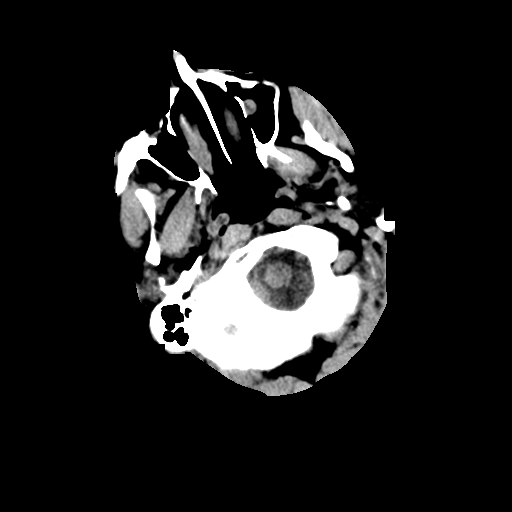
[im 3/30  bone]
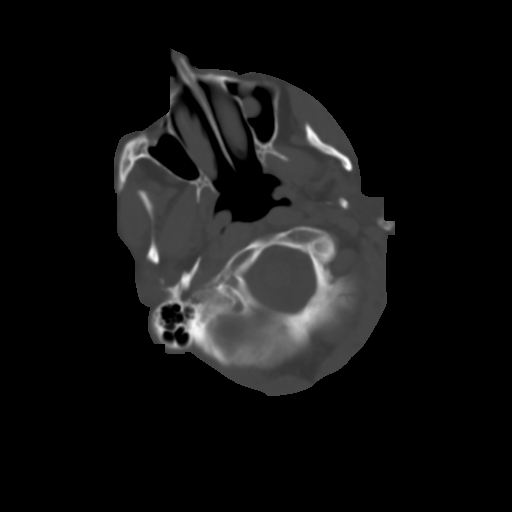
[im 6/30  brain]
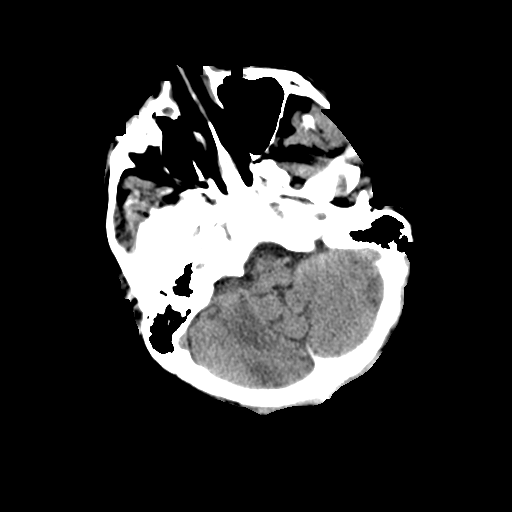
[im 9/30  brain]
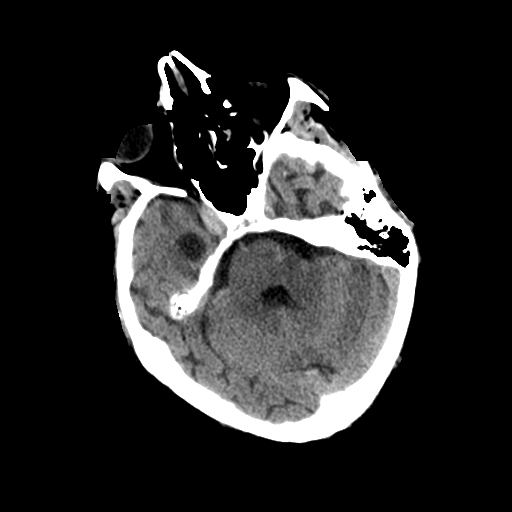
[im 12/30  brain]
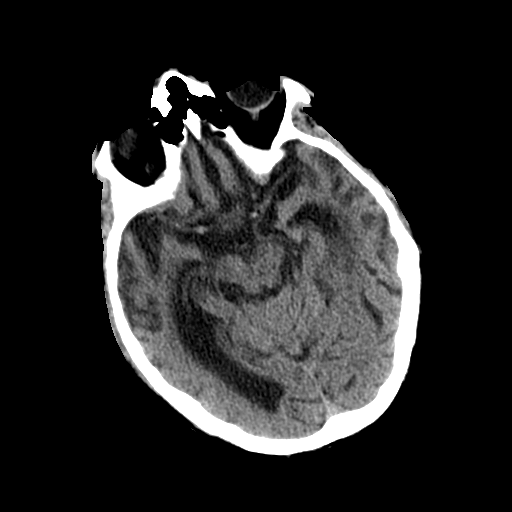
[im 16/30  brain]
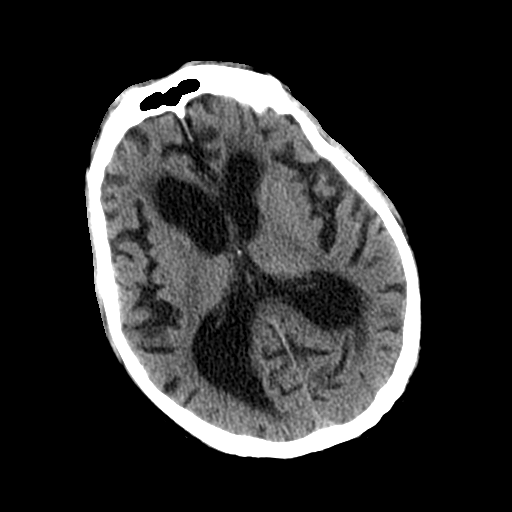
[im 16/30  bone]
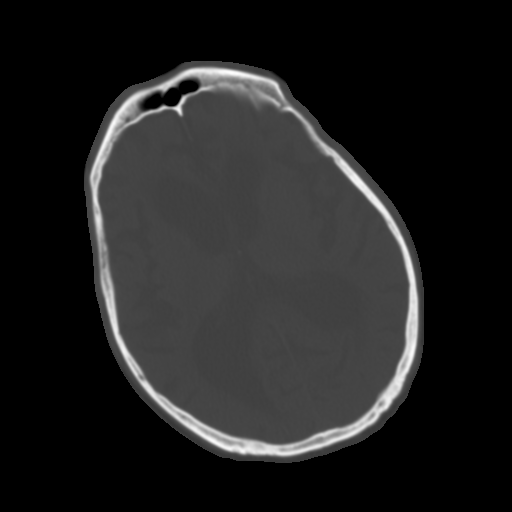
[im 19/30  brain]
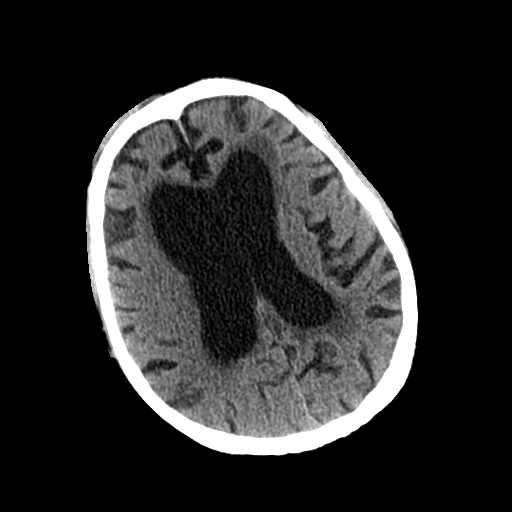
[im 22/30  brain]
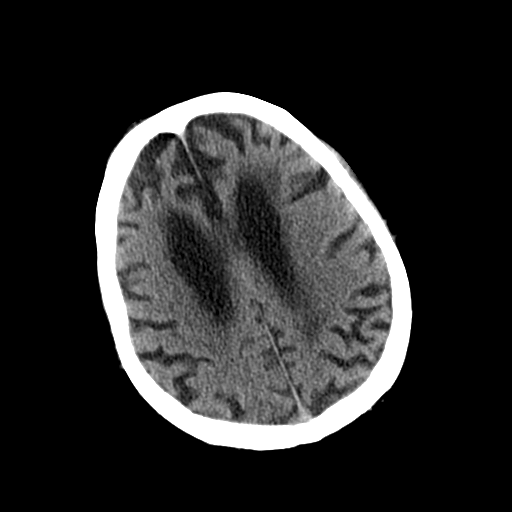
[im 25/30  brain]
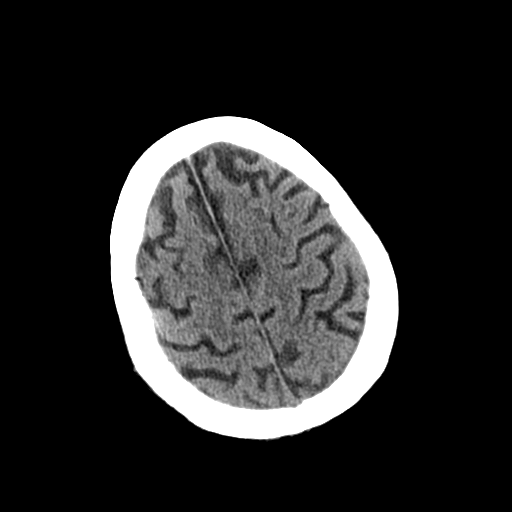
[im 28/30  brain]
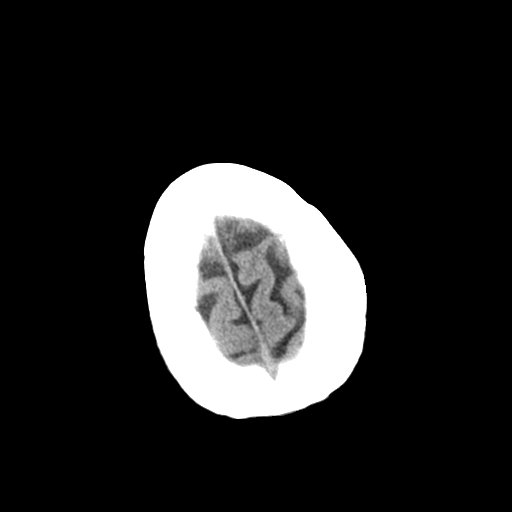
[im 28/30  bone]
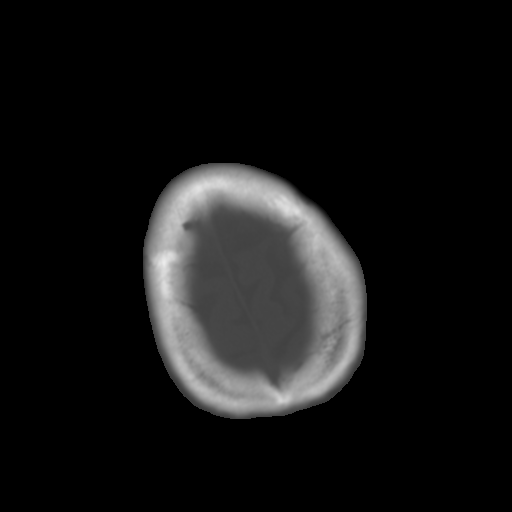

[Series 5: coronal soft tissue · coronal · 0.30mm/px · 3 of 79 slices shown]
[im 27/79  brain]
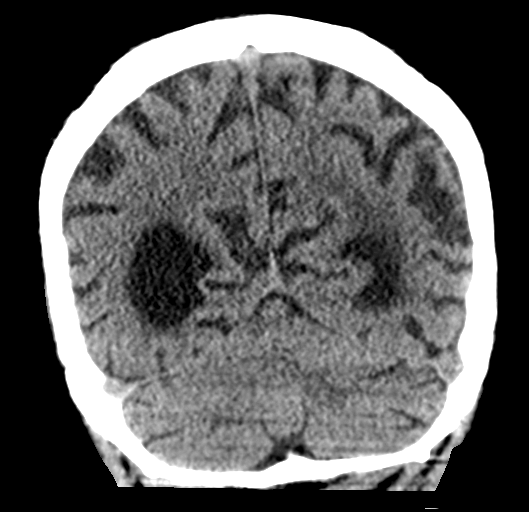
[im 35/79  brain]
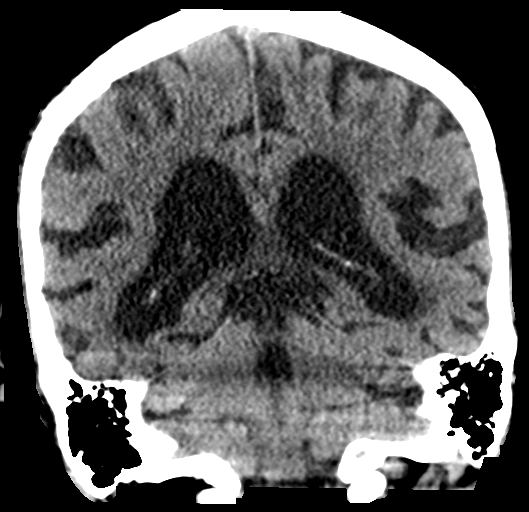
[im 44/79  brain]
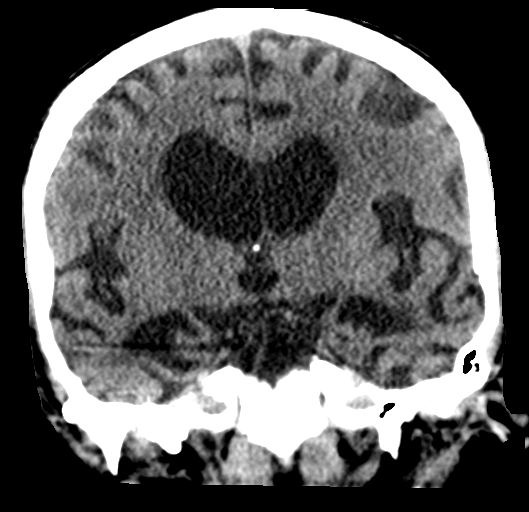

[Series 6: sagittal soft tissue · sagittal · 0.30mm/px · 3 of 53 slices shown]
[im 18/53  brain]
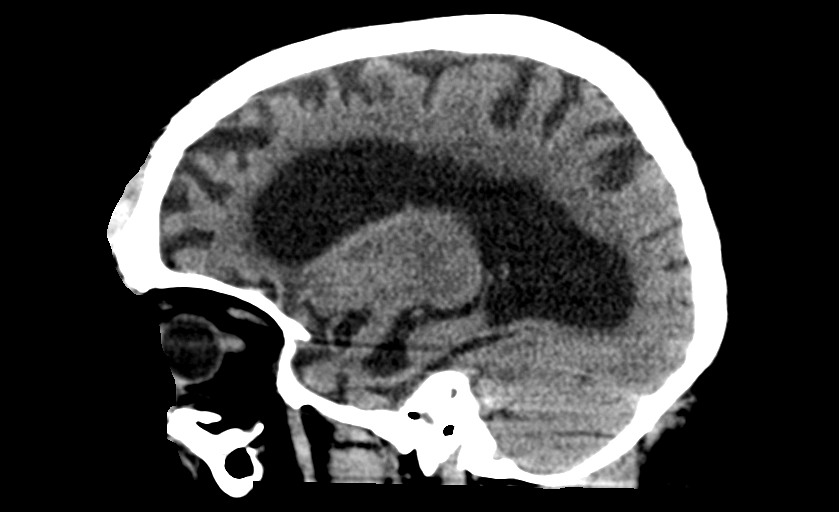
[im 27/53  brain]
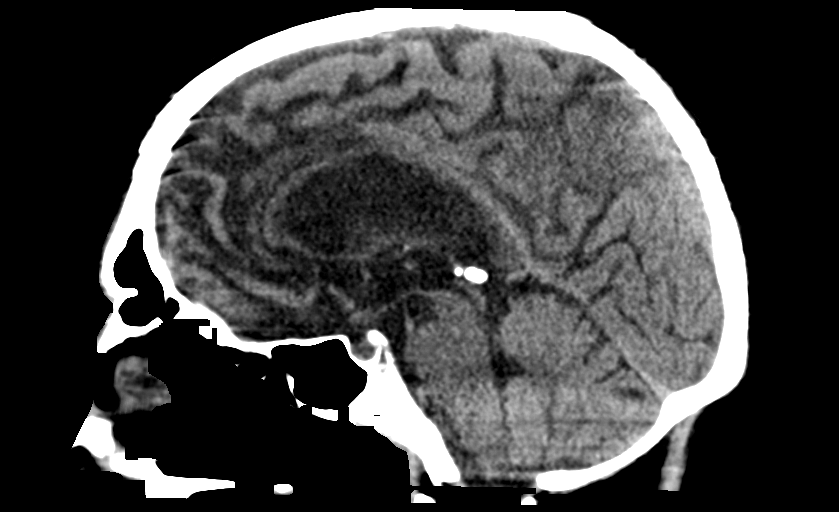
[im 35/53  brain]
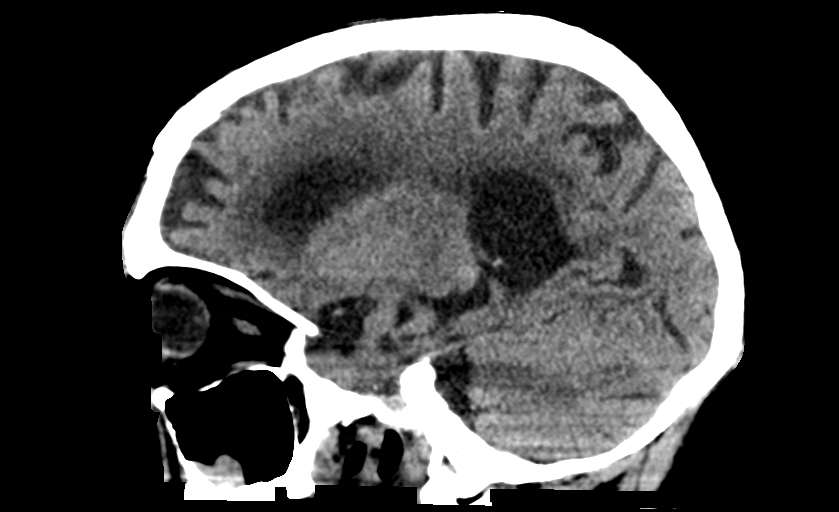

[15 of 47 positions shown; findings below may reference images not displayed]

FINDINGS: Brain: There is moderate severity cerebral atrophy with widening of
the extra-axial spaces and ventricular dilatation.
There are areas of decreased attenuation within the white matter
tracts of the supratentorial brain, consistent with microvascular
disease changes.

Vascular: No hyperdense vessel or unexpected calcification.

Skull: Normal. Negative for fracture or focal lesion.

Sinuses/Orbits: A small metallic density fixation plate is seen
along the anterior wall of the right maxillary sinus.

Other: There is mild right frontal scalp soft tissue swelling. An
associated 2.4 cm x 0.8 cm right frontal scalp hematoma is noted.
IMPRESSION: 1. Right frontal scalp soft tissue swelling and small right frontal
scalp hematoma.
2. No acute intracranial abnormality.
3. Moderate severity cerebral atrophy and microvascular disease
changes of the supratentorial brain.

## 2021-04-03 IMAGING — CT CT CERVICAL SPINE W/O CM
3 of 4 series · 12 of 35 positions shown, 14 images · non-contrast
Comparison: March 03, 2019

CLINICAL DATA: Status post trauma.

EXAM:
CT CERVICAL SPINE WITHOUT CONTRAST
TECHNIQUE: Multidetector CT imaging of the cervical spine was performed without
intravenous contrast. Multiplanar CT image reconstructions were also
generated.

[Series 6: orthogonal bone · axial · 0.23mm/px · z∈[+1024,+1150]mm · 4 of 109 slices shown, 5 images]
[im 16/109  soft-tissue]
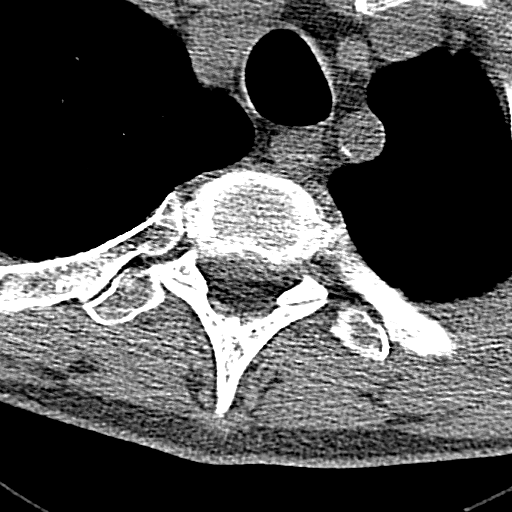
[im 16/109  bone]
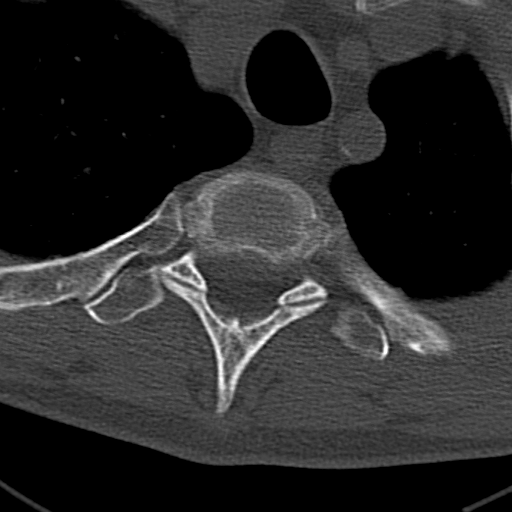
[im 47/109  bone]
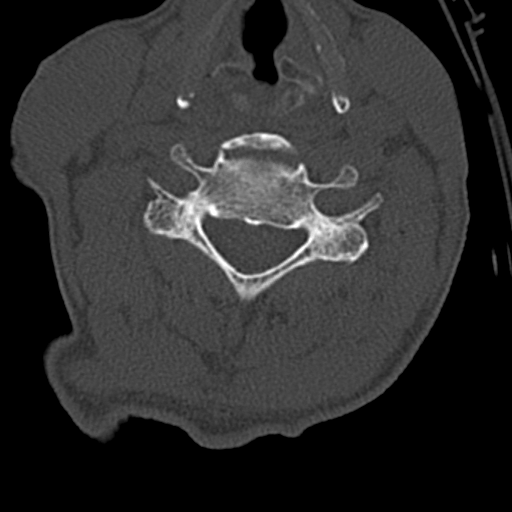
[im 62/109  bone]
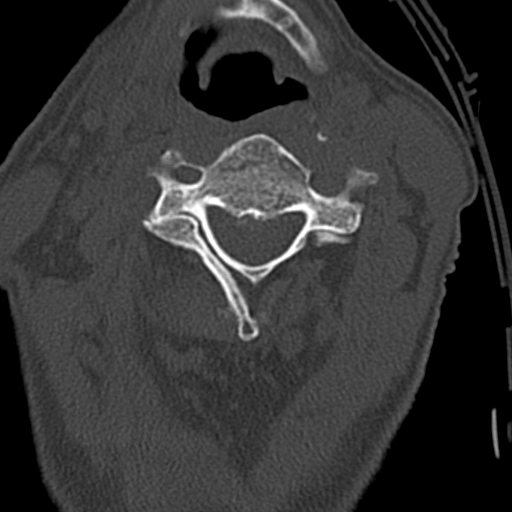
[im 93/109  bone]
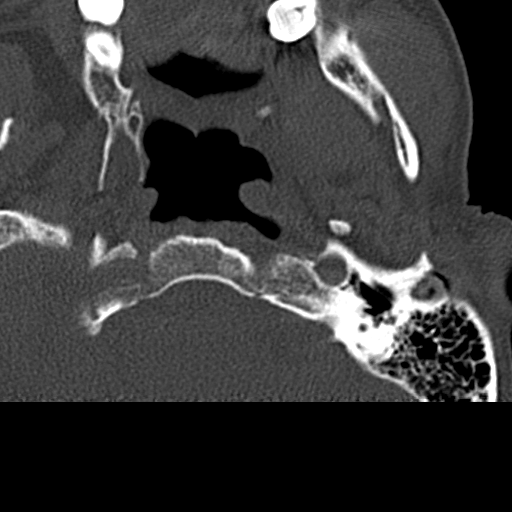

[Series 7: coronal bone · coronal · 0.27mm/px · 3 of 61 slices shown]
[im 17/61  bone]
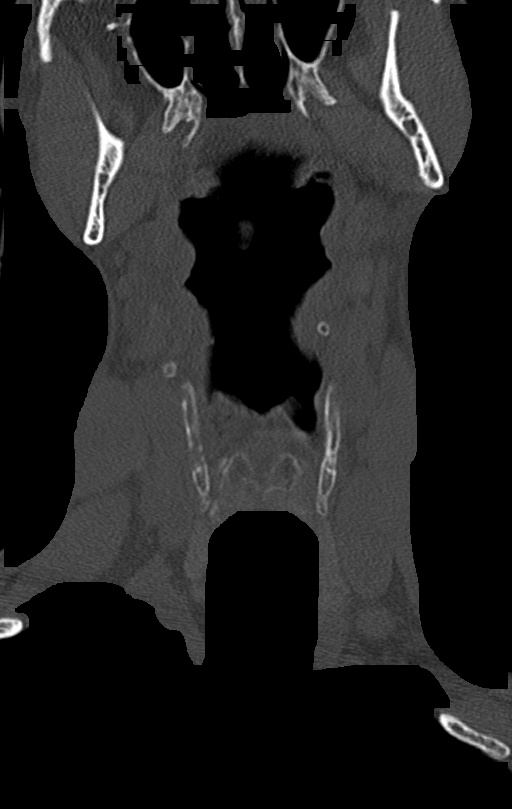
[im 26/61  bone]
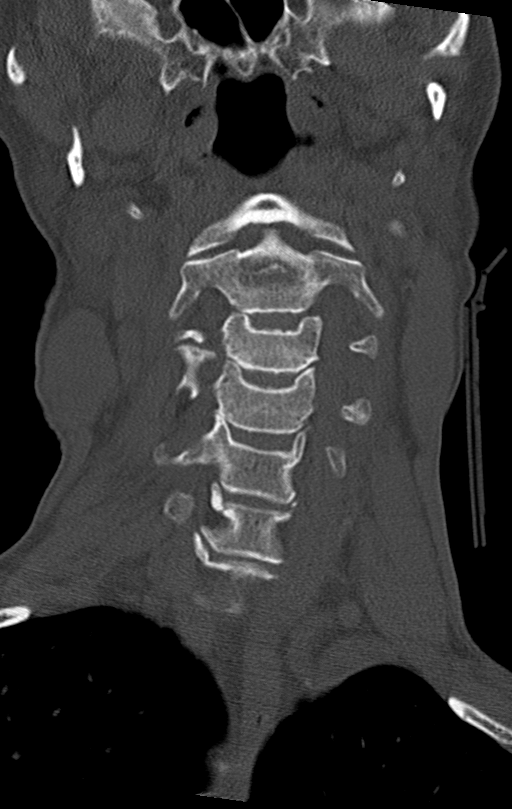
[im 35/61  bone]
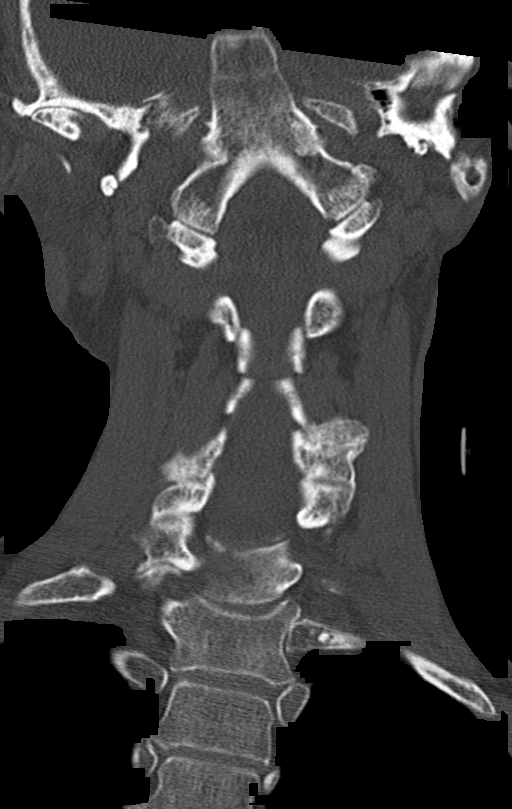

[Series 8: sagittal bone · sagittal · 0.27mm/px · 5 of 55 slices shown, 6 images]
[im 19/55  bone]
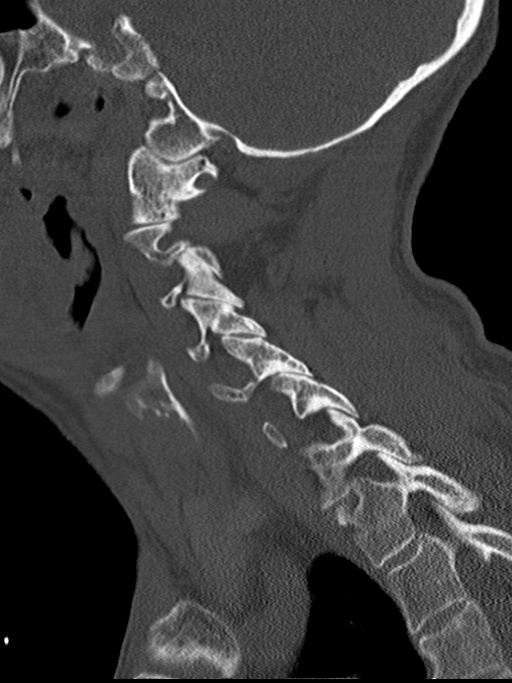
[im 23/55  bone]
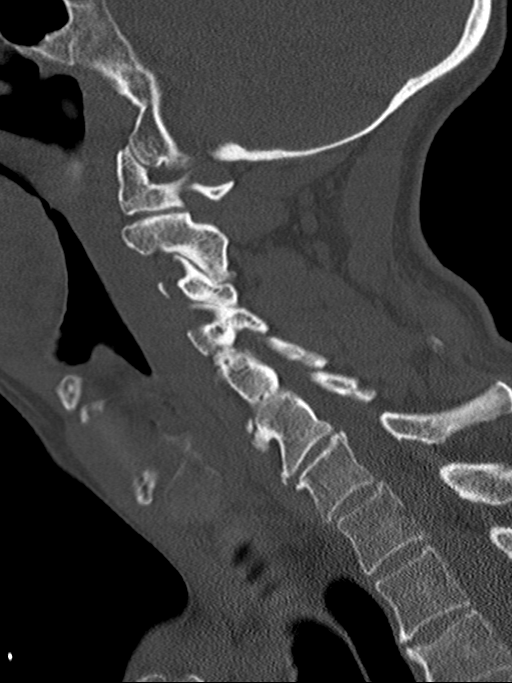
[im 28/55  soft-tissue]
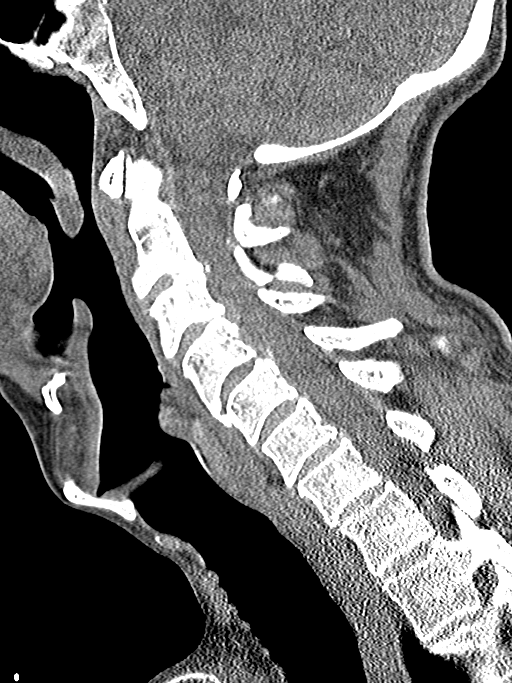
[im 28/55  bone]
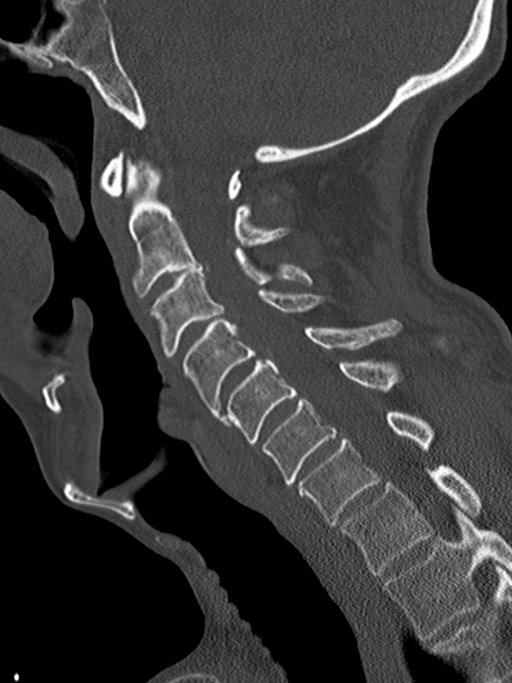
[im 32/55  bone]
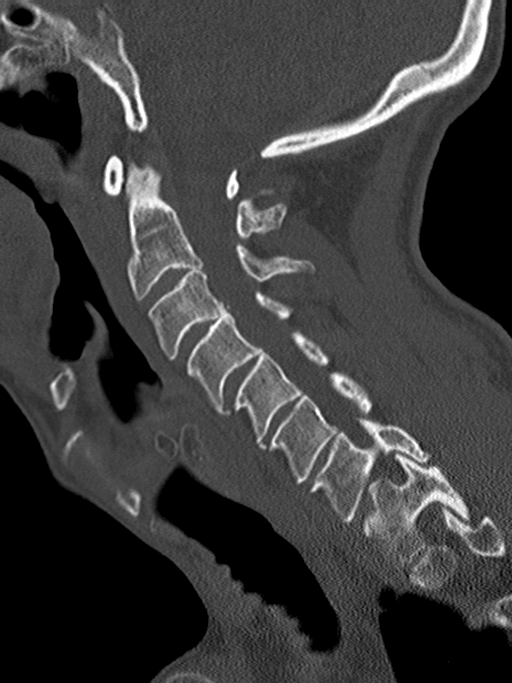
[im 37/55  bone]
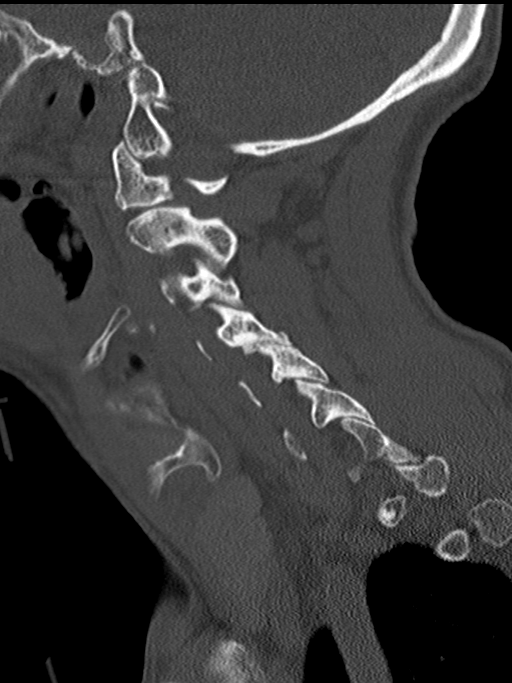

[12 of 35 positions shown; findings below may reference images not displayed]

FINDINGS: Alignment: Normal.

Skull base and vertebrae: No acute fracture. No primary bone lesion
or focal pathologic process.

Soft tissues and spinal canal: No prevertebral fluid or swelling. No
visible canal hematoma.

Disc levels:

C2-3: There is mild end plate spondylosis. Mild disc space narrowing
is seen. Bilateral facet hypertrophy is noted. Normal central canal
and intervertebral neuroforamina.

C3-4: There is mild end plate spondylosis. Mild disc space narrowing
is seen. Bilateral facet hypertrophy is noted. Normal central canal
and intervertebral neuroforamina.

C4-5: There is mild to moderate severity end plate spondylosis. Mild
disc space narrowing is seen. Bilateral facet hypertrophy is noted.
Normal central canal and intervertebral neuroforamina.

C5-6: There is mild end plate spondylosis. Mild disc space narrowing
is seen. Bilateral facet hypertrophy is noted. Normal central canal
and intervertebral neuroforamina.

C6-7: There is mild end plate spondylosis. Mild disc space narrowing
is seen. Bilateral facet hypertrophy is noted. Normal central canal
and intervertebral neuroforamina.

C7-T1: There is mild end plate spondylosis. Mild disc space
narrowing is seen. Bilateral facet hypertrophy is noted. Normal
central canal and intervertebral neuroforamina.

Upper chest: Negative.

Other: A stable 1.0 cm x 0.4 cm sclerotic focus is seen within the
first left rib.
IMPRESSION: 1. No acute osseous abnormality.
2. Mild degenerative changes, slightly more prominent at the levels
of C4-C5 and C5-C6.

## 2024-03-17 DEATH — deceased
